# Patient Record
Sex: Female | Born: 1960 | Race: Black or African American | Hispanic: No | Marital: Single | Smoking: Never smoker
Health system: Southern US, Community
[De-identification: ages and names within clinical notes are randomized; demographics above are authoritative.]

## PROBLEM LIST (undated history)

## (undated) DIAGNOSIS — S069X9A Unspecified intracranial injury with loss of consciousness of unspecified duration, initial encounter: Secondary | ICD-10-CM

## (undated) DIAGNOSIS — I10 Essential (primary) hypertension: Secondary | ICD-10-CM

## (undated) DIAGNOSIS — E669 Obesity, unspecified: Secondary | ICD-10-CM

## (undated) DIAGNOSIS — G4730 Sleep apnea, unspecified: Secondary | ICD-10-CM

## (undated) DIAGNOSIS — J45909 Unspecified asthma, uncomplicated: Secondary | ICD-10-CM

## (undated) DIAGNOSIS — M1990 Unspecified osteoarthritis, unspecified site: Secondary | ICD-10-CM

## (undated) DIAGNOSIS — R0600 Dyspnea, unspecified: Secondary | ICD-10-CM

## (undated) DIAGNOSIS — S069XAA Unspecified intracranial injury with loss of consciousness status unknown, initial encounter: Secondary | ICD-10-CM

## (undated) HISTORY — PX: BRAIN SURGERY: SHX531

## (undated) HISTORY — DX: Unspecified asthma, uncomplicated: J45.909

---

## 1997-06-24 ENCOUNTER — Emergency Department (HOSPITAL_COMMUNITY): Admission: EM | Admit: 1997-06-24 | Discharge: 1997-06-24 | Payer: Self-pay | Admitting: Emergency Medicine

## 1997-07-13 ENCOUNTER — Encounter: Admission: RE | Admit: 1997-07-13 | Discharge: 1997-07-13 | Payer: Self-pay | Admitting: Obstetrics & Gynecology

## 1997-08-04 ENCOUNTER — Encounter: Admission: RE | Admit: 1997-08-04 | Discharge: 1997-08-04 | Payer: Self-pay | Admitting: Internal Medicine

## 1997-08-26 ENCOUNTER — Encounter: Admission: RE | Admit: 1997-08-26 | Discharge: 1997-08-26 | Payer: Self-pay | Admitting: Internal Medicine

## 1997-08-31 ENCOUNTER — Encounter: Admission: RE | Admit: 1997-08-31 | Discharge: 1997-11-29 | Payer: Self-pay | Admitting: *Deleted

## 1997-10-12 ENCOUNTER — Other Ambulatory Visit: Admission: RE | Admit: 1997-10-12 | Discharge: 1997-10-12 | Payer: Self-pay | Admitting: Obstetrics & Gynecology

## 1997-10-12 ENCOUNTER — Encounter: Admission: RE | Admit: 1997-10-12 | Discharge: 1997-10-12 | Payer: Self-pay | Admitting: Obstetrics & Gynecology

## 1998-06-16 ENCOUNTER — Encounter: Admission: RE | Admit: 1998-06-16 | Discharge: 1998-06-16 | Payer: Self-pay | Admitting: Internal Medicine

## 1998-11-11 ENCOUNTER — Other Ambulatory Visit: Admission: RE | Admit: 1998-11-11 | Discharge: 1998-11-11 | Payer: Self-pay | Admitting: Internal Medicine

## 1998-11-11 ENCOUNTER — Encounter: Admission: RE | Admit: 1998-11-11 | Discharge: 1998-11-11 | Payer: Self-pay | Admitting: Internal Medicine

## 1998-11-25 ENCOUNTER — Encounter: Admission: RE | Admit: 1998-11-25 | Discharge: 1998-11-25 | Payer: Self-pay | Admitting: Internal Medicine

## 1999-07-04 ENCOUNTER — Encounter: Admission: RE | Admit: 1999-07-04 | Discharge: 1999-07-04 | Payer: Self-pay | Admitting: Internal Medicine

## 2002-06-22 ENCOUNTER — Encounter: Payer: Self-pay | Admitting: *Deleted

## 2002-06-22 ENCOUNTER — Emergency Department (HOSPITAL_COMMUNITY): Admission: EM | Admit: 2002-06-22 | Discharge: 2002-06-22 | Payer: Self-pay | Admitting: Emergency Medicine

## 2003-05-03 ENCOUNTER — Encounter: Admission: RE | Admit: 2003-05-03 | Discharge: 2003-05-03 | Payer: Self-pay | Admitting: Internal Medicine

## 2003-05-05 ENCOUNTER — Encounter: Admission: RE | Admit: 2003-05-05 | Discharge: 2003-05-05 | Payer: Self-pay | Admitting: Internal Medicine

## 2006-06-13 ENCOUNTER — Emergency Department (HOSPITAL_COMMUNITY): Admission: EM | Admit: 2006-06-13 | Discharge: 2006-06-13 | Payer: Self-pay | Admitting: Emergency Medicine

## 2010-02-14 ENCOUNTER — Emergency Department (HOSPITAL_COMMUNITY)
Admission: EM | Admit: 2010-02-14 | Discharge: 2010-02-14 | Payer: Self-pay | Source: Home / Self Care | Admitting: Emergency Medicine

## 2011-12-19 ENCOUNTER — Emergency Department (HOSPITAL_COMMUNITY)
Admission: EM | Admit: 2011-12-19 | Discharge: 2011-12-19 | Disposition: A | Discharge status: Home / Self Care | Payer: Medicare Other | Attending: Emergency Medicine | Admitting: Emergency Medicine

## 2011-12-19 ENCOUNTER — Emergency Department (HOSPITAL_COMMUNITY): Payer: Medicare Other

## 2011-12-19 ENCOUNTER — Encounter (HOSPITAL_COMMUNITY): Payer: Self-pay

## 2011-12-19 DIAGNOSIS — S92902A Unspecified fracture of left foot, initial encounter for closed fracture: Secondary | ICD-10-CM

## 2011-12-19 DIAGNOSIS — Y929 Unspecified place or not applicable: Secondary | ICD-10-CM | POA: Insufficient documentation

## 2011-12-19 DIAGNOSIS — S92309A Fracture of unspecified metatarsal bone(s), unspecified foot, initial encounter for closed fracture: Secondary | ICD-10-CM | POA: Insufficient documentation

## 2011-12-19 DIAGNOSIS — Z87891 Personal history of nicotine dependence: Secondary | ICD-10-CM | POA: Insufficient documentation

## 2011-12-19 DIAGNOSIS — Z87820 Personal history of traumatic brain injury: Secondary | ICD-10-CM | POA: Insufficient documentation

## 2011-12-19 DIAGNOSIS — E669 Obesity, unspecified: Secondary | ICD-10-CM | POA: Insufficient documentation

## 2011-12-19 DIAGNOSIS — R269 Unspecified abnormalities of gait and mobility: Secondary | ICD-10-CM | POA: Insufficient documentation

## 2011-12-19 DIAGNOSIS — Y9389 Activity, other specified: Secondary | ICD-10-CM | POA: Insufficient documentation

## 2011-12-19 DIAGNOSIS — X500XXA Overexertion from strenuous movement or load, initial encounter: Secondary | ICD-10-CM | POA: Insufficient documentation

## 2011-12-19 DIAGNOSIS — M7989 Other specified soft tissue disorders: Secondary | ICD-10-CM | POA: Insufficient documentation

## 2011-12-19 HISTORY — DX: Obesity, unspecified: E66.9

## 2011-12-19 HISTORY — DX: Unspecified intracranial injury with loss of consciousness status unknown, initial encounter: S06.9XAA

## 2011-12-19 HISTORY — DX: Unspecified intracranial injury with loss of consciousness of unspecified duration, initial encounter: S06.9X9A

## 2011-12-19 MED ORDER — TRAMADOL HCL 50 MG PO TABS: 50.0000 mg | ORAL_TABLET | Freq: Four times a day (QID) | ORAL | Status: DC | PRN

## 2011-12-19 NOTE — ED Notes (Signed)
Patient c/o left foot pain and states that she has had 2 falls the most recent being 2 weeks ago. Patient has swelling to left foot and left ankle. Patient c/o increased pain when bearing weight.

## 2011-12-21 NOTE — ED Provider Notes (Signed)
History     CSN: [id number]  Arrival date & time 12/19/11  1241   First MD Initiated Contact with Patient 12/19/11 1245      Chief Complaint  Patient presents with  . Foot Injury    (Consider location/radiation/quality/duration/timing/severity/associated sxs/prior treatment) HPI Comments: CC left foot pain  Ankle eversion sprain while walking down steps 4 weeks ago w/ immediate onset swelling, ecchymosis, and pain. 2 weeks ago, patient hit the same foot on Lateral side against the edge of the stairs. +sweliing, warmth, TTP Worse with walking. Better rest/elevaation. OTC analgesics without sig. Relief.  Using cane for ambulation. No HX gout, inflammatory arthritis. Denies systemic jt involvement. Denies paresthesias or inability to move foot/ankle/toes.  Denies fevers, chills, myalgias, arthralgias. Denies DOE, SOB, chest tightness or pressure, radiation to left arm, jaw or back, or diaphoresis. Denies dysuria, flank pain, suprapubic pain, frequency, urgency, or hematuria. Denies headaches, light headedness, weakness, visual disturbances. Denies abdominal pain, nausea, vomiting, diarrhea or constipation.      Patient is a 51 y.o. female presenting with lower extremity pain. The history is provided by the patient and a relative. No language interpreter was used.  Foot Pain This is a chronic problem. The current episode started 1 to 4 weeks ago. The problem occurs constantly. The problem has been unchanged. Associated symptoms include joint swelling (L foot). Pertinent negatives include no abdominal pain, anorexia, arthralgias, change in bowel habit, chest pain, chills, congestion, coughing, diaphoresis, fatigue, fever, headaches, myalgias, nausea, neck pain, numbness, rash, sore throat, swollen glands, urinary symptoms, vertigo, visual change, vomiting or weakness. The symptoms are aggravated by walking. She has tried acetaminophen, ice, heat, NSAIDs and relaxation (epsom salt  soaks) for the symptoms.    Past Medical History  Diagnosis Date  . Obesity   . Brain injury     Past Surgical History  Procedure Date  . Brain surgery     No family history on file.  History  Substance Use Topics  . Smoking status: Former Games developer  . Smokeless tobacco: Never Used  . Alcohol Use: Yes     Comment: daily beer and liquor/3 beers and 5 shots of liquor    OB History    Grav Para Term Preterm Abortions TAB SAB Ect Mult Living                  Review of Systems  Constitutional: Negative.  Negative for fever, chills, diaphoresis and fatigue.  HENT: Negative.  Negative for congestion, sore throat and neck pain.   Eyes: Negative.   Respiratory: Negative.  Negative for cough.   Cardiovascular: Negative.  Negative for chest pain.  Gastrointestinal: Negative.  Negative for nausea, vomiting, abdominal pain, anorexia and change in bowel habit.  Genitourinary: Negative.   Musculoskeletal: Positive for joint swelling (L foot) and gait problem. Negative for myalgias and arthralgias.  Skin: Negative for pallor, rash and wound.  Neurological: Negative for vertigo, weakness, numbness and headaches.  Psychiatric/Behavioral: Negative.     Allergies  Review of patient's allergies indicates no known allergies.  Home Medications   Current Outpatient Rx  Name  Route  Sig  Dispense  Refill  . ACETAMINOPHEN 500 MG PO TABS   Oral   Take 1,000 mg by mouth every 6 (six) hours as needed. Pain         . IBUPROFEN 200 MG PO TABS   Oral   Take 400 mg by mouth every 6 (six) hours as needed. Pain         .  TRAMADOL HCL 50 MG PO TABS   Oral   Take 1 tablet (50 mg total) by mouth every 6 (six) hours as needed for pain.   15 tablet   0     BP 143/95  Pulse 89  Temp 98.4 F (36.9 C)  Resp 16  SpO2 96%  Physical Exam  Nursing note and vitals reviewed. Constitutional: She is oriented to person, place, and time. She appears well-developed and well-nourished. No  distress.       Morbidly obese  HENT:  Head: Normocephalic and atraumatic.  Eyes: Conjunctivae normal are normal. No scleral icterus.  Neck: Normal range of motion.  Cardiovascular: Normal rate, regular rhythm and normal heart sounds.  Exam reveals no gallop and no friction rub.   No murmur heard. Pulmonary/Chest: Effort normal and breath sounds normal. No respiratory distress.  Abdominal: Soft. Bowel sounds are normal. She exhibits no distension and no mass. There is no tenderness. There is no guarding.  Musculoskeletal:       A left foot exam was performed. Skin: bruising, erythematous and along lateral aspect ad bases of toes 2,3,4 Swelling: moderate Warmth: mildly increased warmth Tenderness: diffuse ROM: limited by pain Gait: antalgic Neurological Exam: normal Vascular Exam: normal   Neurological: She is alert and oriented to person, place, and time.  Skin: Skin is warm and dry. She is not diaphoretic.    ED Course  Procedures (including critical care time)  Labs Reviewed - No data to display Dg Ankle Complete Left  12/19/2011  *RADIOLOGY REPORT*  Clinical Data: Fall.  Pain and swelling.  LEFT ANKLE COMPLETE - 3+ VIEW  Comparison: None.  Findings: No acute fracture at the ankle joint.  There is a fracture of the base of the fifth metatarsal.  No other acute finding.  IMPRESSION: Fracture of the base of the fifth metatarsal.   Original Report Authenticated By: Paulina Fusi, M.D.    Dg Foot Complete Left  12/19/2011  *RADIOLOGY REPORT*  Clinical Data: Pain and swelling  LEFT FOOT - COMPLETE 3+ VIEW  Comparison: Ankle radiography same day  Findings: There is a fracture of the base of the fifth metatarsal. There are acute fractures of the second and third metatarsal head regions.  There is a healed or healing fracture of the proximal phalanx of the fifth toe.  IMPRESSION: Fracture of the base of the fifth metatarsal.  Fracture of the metatarsal heads of the second and third toes.   Healed or healing fracture of the proximal phalanx of the fifth toe.   Original Report Authenticated By: Paulina Fusi, M.D.      1. Foot fracture, left       MDM  Exam concerning for possible gout vs msk injury/frax. NV intact   Filed Vitals:   12/19/11 1308 12/19/11 1458  BP: 117/75 143/95  Pulse: 89   Temp: 98.4 F (36.9 C)   Resp: 16   SpO2: 96%     Patient with frax of metatarsal heads toes 2/3 which is newer. Older healing frax of prox 5th phalanx. D/C with pain control, cam walker/ crutches and close ortho f/u.         Arthor Captain, PA-C 12/21/11 0023

## 2011-12-21 NOTE — ED Provider Notes (Signed)
Medical screening examination/treatment/procedure(s) were performed by non-physician practitioner and as supervising physician I was immediately available for consultation/collaboration.  Zackary Mckeone T Charlestine Rookstool, MD 12/21/11 1644 

## 2012-06-24 ENCOUNTER — Encounter (HOSPITAL_COMMUNITY): Payer: Self-pay | Admitting: Emergency Medicine

## 2012-06-24 ENCOUNTER — Inpatient Hospital Stay (HOSPITAL_COMMUNITY)
Admission: EM | Admit: 2012-06-24 | Discharge: 2012-06-27 | DRG: 379 | Disposition: A | Discharge status: Home / Self Care | Payer: Medicare Other | Attending: Internal Medicine | Admitting: Internal Medicine

## 2012-06-24 DIAGNOSIS — R7989 Other specified abnormal findings of blood chemistry: Secondary | ICD-10-CM | POA: Diagnosis present

## 2012-06-24 DIAGNOSIS — E669 Obesity, unspecified: Secondary | ICD-10-CM | POA: Diagnosis present

## 2012-06-24 DIAGNOSIS — R748 Abnormal levels of other serum enzymes: Secondary | ICD-10-CM | POA: Diagnosis present

## 2012-06-24 DIAGNOSIS — I10 Essential (primary) hypertension: Secondary | ICD-10-CM | POA: Diagnosis present

## 2012-06-24 DIAGNOSIS — R197 Diarrhea, unspecified: Secondary | ICD-10-CM | POA: Diagnosis present

## 2012-06-24 DIAGNOSIS — E876 Hypokalemia: Secondary | ICD-10-CM | POA: Diagnosis present

## 2012-06-24 DIAGNOSIS — F1210 Cannabis abuse, uncomplicated: Secondary | ICD-10-CM | POA: Diagnosis present

## 2012-06-24 DIAGNOSIS — K648 Other hemorrhoids: Secondary | ICD-10-CM | POA: Diagnosis present

## 2012-06-24 DIAGNOSIS — K625 Hemorrhage of anus and rectum: Secondary | ICD-10-CM | POA: Diagnosis present

## 2012-06-24 DIAGNOSIS — D696 Thrombocytopenia, unspecified: Secondary | ICD-10-CM | POA: Diagnosis present

## 2012-06-24 DIAGNOSIS — K5731 Diverticulosis of large intestine without perforation or abscess with bleeding: Principal | ICD-10-CM | POA: Diagnosis present

## 2012-06-24 DIAGNOSIS — F1010 Alcohol abuse, uncomplicated: Secondary | ICD-10-CM | POA: Diagnosis present

## 2012-06-24 DIAGNOSIS — R000 Tachycardia, unspecified: Secondary | ICD-10-CM | POA: Diagnosis present

## 2012-06-24 HISTORY — DX: Essential (primary) hypertension: I10

## 2012-06-24 LAB — CBC
HCT: 41.8 % (ref 36.0–46.0)
Hemoglobin: 14.6 g/dL (ref 12.0–15.0)
MCH: 31.5 pg (ref 26.0–34.0)
MCV: 91.9 fL (ref 78.0–100.0)
MCV: 91.9 fL (ref 78.0–100.0)
Platelets: 110 10*3/uL — ABNORMAL LOW (ref 150–400)
RBC: 4.55 MIL/uL (ref 3.87–5.11)
RBC: 4.22 MIL/uL (ref 3.87–5.11)
RDW: 13.1 % (ref 11.5–15.5)
WBC: 5.5 10*3/uL (ref 4.0–10.5)

## 2012-06-24 LAB — PROTIME-INR
INR: 1.17 (ref 0.00–1.49)
Prothrombin Time: 14.7 seconds (ref 11.6–15.2)

## 2012-06-24 LAB — COMPREHENSIVE METABOLIC PANEL
Alkaline Phosphatase: 130 U/L — ABNORMAL HIGH (ref 39–117)
BUN: 10 mg/dL (ref 6–23)
CO2: 25 mEq/L (ref 19–32)
Chloride: 103 mEq/L (ref 96–112)
Creatinine, Ser: 0.55 mg/dL (ref 0.50–1.10)
GFR calc non Af Amer: >90 mL/min (ref >90)
Total Bilirubin: 1 mg/dL (ref 0.3–1.2)

## 2012-06-24 LAB — OCCULT BLOOD, POC DEVICE: Fecal Occult Bld: POSITIVE — AB

## 2012-06-24 MED ORDER — POTASSIUM CHLORIDE CRYS ER 20 MEQ PO TBCR: 30.0000 meq | EXTENDED_RELEASE_TABLET | Freq: Once | ORAL | Status: DC

## 2012-06-24 MED ORDER — SODIUM CHLORIDE 0.9 % IV BOLUS (SEPSIS)
1000.0000 mL | Freq: Once | INTRAVENOUS | Status: AC
  Administered 2012-06-24: 1000 mL via INTRAVENOUS

## 2012-06-24 MED ORDER — THIAMINE HCL 100 MG/ML IJ SOLN
100.0000 mg | Freq: Every day | INTRAMUSCULAR | Status: DC
  Filled 2012-06-24 (×3): qty 1

## 2012-06-24 MED ORDER — ADULT MULTIVITAMIN W/MINERALS CH
1.0000 | ORAL_TABLET | Freq: Every day | ORAL | Status: DC
  Administered 2012-06-25 – 2012-06-27 (×2): 1 via ORAL
  Filled 2012-06-24 (×3): qty 1

## 2012-06-24 MED ORDER — LORAZEPAM 1 MG PO TABS: 1.0000 mg | ORAL_TABLET | Freq: Four times a day (QID) | ORAL | Status: DC | PRN

## 2012-06-24 MED ORDER — ONDANSETRON HCL 4 MG/2ML IJ SOLN: 4.0000 mg | Freq: Three times a day (TID) | INTRAMUSCULAR | Status: AC | PRN

## 2012-06-24 MED ORDER — ALUM & MAG HYDROXIDE-SIMETH 200-200-20 MG/5ML PO SUSP: 30.0000 mL | Freq: Four times a day (QID) | ORAL | Status: DC | PRN

## 2012-06-24 MED ORDER — FOLIC ACID 1 MG PO TABS
1.0000 mg | ORAL_TABLET | Freq: Every day | ORAL | Status: DC
  Administered 2012-06-25 – 2012-06-27 (×2): 1 mg via ORAL
  Filled 2012-06-24 (×3): qty 1

## 2012-06-24 MED ORDER — SODIUM CHLORIDE 0.9 % IV SOLN
INTRAVENOUS | Status: DC
  Administered 2012-06-24: 75 mL/h via INTRAVENOUS

## 2012-06-24 MED ORDER — POLYETHYLENE GLYCOL 3350 17 G PO PACK
17.0000 g | PACK | Freq: Every day | ORAL | Status: DC | PRN
  Filled 2012-06-24: qty 1

## 2012-06-24 MED ORDER — ACETAMINOPHEN 650 MG RE SUPP: 650.0000 mg | Freq: Four times a day (QID) | RECTAL | Status: DC | PRN

## 2012-06-24 MED ORDER — SODIUM CHLORIDE 0.9 % IV SOLN: INTRAVENOUS | Status: DC

## 2012-06-24 MED ORDER — ACETAMINOPHEN 325 MG PO TABS
650.0000 mg | ORAL_TABLET | Freq: Four times a day (QID) | ORAL | Status: DC | PRN
  Filled 2012-06-24: qty 2

## 2012-06-24 MED ORDER — VITAMIN B-1 100 MG PO TABS
100.0000 mg | ORAL_TABLET | Freq: Every day | ORAL | Status: DC
  Administered 2012-06-25 – 2012-06-27 (×2): 100 mg via ORAL
  Filled 2012-06-24 (×3): qty 1

## 2012-06-24 MED ORDER — LORAZEPAM 2 MG/ML IJ SOLN: 1.0000 mg | Freq: Four times a day (QID) | INTRAMUSCULAR | Status: DC | PRN

## 2012-06-24 NOTE — ED Notes (Signed)
Pos hemoccult reported to Dr Elly Modena

## 2012-06-24 NOTE — H&P (Signed)
[facility] History and Physical  [name] XBJ:[id number] DOB: May 04, 1960 DOA: 06/24/2012  Referring physician: Dr. Denton Lank PCP: Lieutenant Diego, MD  Specialists: none  Chief Complaint: rectal bleed  HPI: Lauren Moore is a 52 y.o. female without known PMH because he does not follow up with a doctor regularly, presents with a chief complaint of rectal bleed since last night. She states that she had a bowel movement last night, formed, and saw bright red blood both mixed with stool, and in the water bowl. She had 2 more bowel movements since. She cannot appreciate the quantity of how much blood she lost. She denies having a problem like this in the past. She denies any dark or stools. She denies any abdominal pain, has no nausea or vomiting. She denies any chest pain or breathing difficulties. She denies any lightheadedness or dizziness. She endorses a history of heavy alcohol abuse, initially stating 2 drinks per day, however later endorsing that "it might be more than that". Last drink was this morning, and she has some liquor. In the emergency room, rectal exam by EDP showed brown stool without blood and without melena, FOBT positive. Patient was persistently tachycardic, more so when sitting up, and thus admission was requested for observation.   Patient has had episodes without drinking in the past, and denies any severe withdrawal symptoms, denies any seizures.  Review of Systems: As per history of present illness, otherwise negative.  Past Medical History  Diagnosis Date  . Obesity   . Brain injury   . Hypertension    Past Surgical History  Procedure Laterality Date  . Brain surgery     Social History:  reports that she has quit smoking. She has never used smokeless tobacco. She reports that  drinks alcohol. She reports that she uses illicit drugs (Marijuana).  No Known Allergies  History reviewed. No pertinent family history.   Prior to Admission medications   Not  on File   Physical Exam: Filed Vitals:   06/24/12 0728 06/24/12 0951 06/24/12 0952 06/24/12 0953  BP: 145/89 155/97 147/101 147/105  Pulse: 113 95 103 110  Temp: 98.1 F (36.7 C)     TempSrc: Oral     Resp: 16 18 18 18   Weight: 104.044 kg (229 lb 6 oz)     SpO2: 92% 95% 95% 95%     General:  No apparent distress  Eyes: PERRL, EOMI, no scleral icterus  ENT: moist oropharynx  Neck: supple, no JVD  Cardiovascular: regular rate without MRG; 2+ peripheral pulses  Respiratory: CTA biL, good air movement without wheezing, rhonchi or crackled  Abdomen: soft, non tender to palpation, positive bowel sounds, no guarding, no rebound  Skin: no rashes  Musculoskeletal: no peripheral edema  Psychiatric: normal mood and affect  Neurologic: CN 2-12 grossly intact, MS 5/5 in all 4  Labs on Admission:  Basic Metabolic Panel:  Recent Labs Lab 06/24/12 0757  NA 141  K 3.4*  CL 103  CO2 25  GLUCOSE 89  BUN 10  CREATININE 0.55  CALCIUM 8.9   Liver Function Tests:  Recent Labs Lab 06/24/12 0757  AST 131*  ALT 39*  ALKPHOS 130*  BILITOT 1.0  PROT 8.8*  ALBUMIN 3.8   CBC:  Recent Labs Lab 06/24/12 0757  WBC 5.5  HGB 14.6  HCT 41.8  MCV 91.9  PLT 144*   EKG: Independently reviewed.  Assessment/Plan Active Problems:   Rectal bleed   Hypokalemia   Thrombocytopenia  Alcohol abuse   Elevated liver enzymes   Rectal bleed - HH stable but persistently tachycardic.  - monitor HH twice, asked to call for nurse to evaluate next BM.   Alcohol abuse - last drink this morning, liquor. Unlikely that her tachycardia is related to ETOH withdrawal, but will withdraw.  - on CIWA  Thrombocytopenia  - probable underlying liver disease  Elevated LFTs - due to #2  DVT Prophylaxis - SCDs  Code Status: Presumed full  Family Communication: sisters in the room  Disposition Plan: Obs, likely home in am   Time spent: 39  Sneha Willig M. Elvera Lennox, MD [facility] Pager [phone]  If 7PM-7AM, please contact night-coverage [email or web address] Password [facility] 06/24/2012, 11:06 AM

## 2012-06-24 NOTE — ED Notes (Addendum)
Pt states she had rectal bleeding yesterday. Pt states she has a knot in her stomach.  Pt denies hematuria.  Pt states she has bilateral back pain for the past couple of weeks.  Pt denies any injury to back in past couple of days. Pt denies burning with urination.  Pt states she has dizziness at times.  Pt states she also has arm numbness. Able to move all extremities evenly. Denies n/v/d.

## 2012-06-24 NOTE — ED Provider Notes (Addendum)
History     CSN: [id number]  Arrival date & time 06/24/12  0721   First MD Initiated Contact with Patient 06/24/12 [phone]      Chief Complaint  Patient presents with  . Rectal Bleeding    (Consider location/radiation/quality/duration/timing/severity/associated sxs/prior treatment) Patient is a 52 y.o. female presenting with hematochezia. The history is provided by the patient and a relative.  Rectal Bleeding Associated symptoms: no abdominal pain, no fever and no vomiting   pt c/o blood per rectum w last 2-3 bms in past couple days. Pt cant quantify amt of bleeding. Bright red blood. Denies hx same or prior gi bleed. Denies prior colonoscopy. Denies hx hemorrhoids, diverticula and pud. No abd pain. No nv. No faintness or dizziness. No other abn bleeding or bruising. No hematuria. Denies recent constipation or straining, no rectal trauma. Denies fever or chills. No anticoagulant use, occasional ibuprofen use. Regular etoh use w several drinks per day.     Past Medical History  Diagnosis Date  . Obesity   . Brain injury   . Hypertension     Past Surgical History  Procedure Laterality Date  . Brain surgery      History reviewed. No pertinent family history.  History  Substance Use Topics  . Smoking status: Former Games developer  . Smokeless tobacco: Never Used  . Alcohol Use: Yes     Comment: daily beer and liquor/3 beers and 5 shots of liquor    OB History   Grav Para Term Preterm Abortions TAB SAB Ect Mult Living                  Review of Systems  Constitutional: Negative for fever.  HENT: Negative for neck pain.   Eyes: Negative for redness.  Respiratory: Negative for cough and shortness of breath.   Cardiovascular: Negative for chest pain.  Gastrointestinal: Positive for hematochezia. Negative for vomiting and abdominal pain.  Genitourinary: Negative for flank pain.  Musculoskeletal: Negative for back pain.  Skin: Negative for rash.  Neurological: Negative for  headaches.  Hematological: Does not bruise/bleed easily.  Psychiatric/Behavioral: Negative for confusion.    Allergies  Review of patient's allergies indicates no known allergies.  Home Medications   Current Outpatient Rx  Name  Route  Sig  Dispense  Refill  . acetaminophen (TYLENOL) 500 MG tablet   Oral   Take 1,000 mg by mouth every 6 (six) hours as needed. Pain         . ibuprofen (ADVIL,MOTRIN) 200 MG tablet   Oral   Take 400 mg by mouth every 6 (six) hours as needed. Pain         . traMADol (ULTRAM) 50 MG tablet   Oral   Take 1 tablet (50 mg total) by mouth every 6 (six) hours as needed for pain.   15 tablet   0     BP 145/89  Pulse 113  Temp(Src) 98.1 F (36.7 C) (Oral)  Resp 16  Wt 229 lb 6 oz (104.044 kg)  SpO2 92%  Physical Exam  Nursing note and vitals reviewed. Constitutional: She appears well-developed and well-nourished. No distress.  HENT:  Mouth/Throat: Oropharynx is clear and moist.  Eyes: Conjunctivae are normal. No scleral icterus.  Neck: Neck supple. No tracheal deviation present.  Cardiovascular: Normal rate, regular rhythm and intact distal pulses.   Pulmonary/Chest: Effort normal and breath sounds normal. No respiratory distress.  Abdominal: Soft. Normal appearance and bowel sounds are normal. She exhibits no distension and  no mass. There is no tenderness. There is no rebound and no guarding.  Musculoskeletal: She exhibits no edema and no tenderness.  Neurological: She is alert.  Skin: Skin is warm and dry. No rash noted.  Psychiatric: She has a normal mood and affect.    ED Course  Procedures (including critical care time)   Results for orders placed during the hospital encounter of 06/24/12  CBC      Result Value Range   WBC 5.5  4.0 - 10.5 K/uL   RBC 4.55  3.87 - 5.11 MIL/uL   Hemoglobin 14.6  12.0 - 15.0 g/dL   HCT 16.1  09.6 - 04.5 %   MCV 91.9  78.0 - 100.0 fL   MCH 32.1  26.0 - 34.0 pg   MCHC 34.9  30.0 - 36.0 g/dL    RDW 40.9  81.1 - 91.4 %   Platelets 144 (*) 150 - 400 K/uL  COMPREHENSIVE METABOLIC PANEL      Result Value Range   Sodium 141  135 - 145 mEq/L   Potassium 3.4 (*) 3.5 - 5.1 mEq/L   Chloride 103  96 - 112 mEq/L   CO2 25  19 - 32 mEq/L   Glucose, Bld 89  70 - 99 mg/dL   BUN 10  6 - 23 mg/dL   Creatinine, Ser 7.82  0.50 - 1.10 mg/dL   Calcium 8.9  8.4 - 95.6 mg/dL   Total Protein 8.8 (*) 6.0 - 8.3 g/dL   Albumin 3.8  3.5 - 5.2 g/dL   AST 213 (*) 0 - 37 U/L   ALT 39 (*) 0 - 35 U/L   Alkaline Phosphatase 130 (*) 39 - 117 U/L   Total Bilirubin 1.0  0.3 - 1.2 mg/dL   GFR calc non Af Amer >90  >90 mL/min   GFR calc Af Amer >90  >90 mL/min  PROTIME-INR      Result Value Range   Prothrombin Time 14.7  11.6 - 15.2 seconds   INR 1.17  0.00 - 1.49  OCCULT BLOOD, POC DEVICE      Result Value Range   Fecal Occult Bld POSITIVE (*) NEGATIVE  TYPE AND SCREEN      Result Value Range   ABO/RH(D) B POS     Antibody Screen NEG     Sample Expiration 06/27/2012    ABO/RH      Result Value Range   ABO/RH(D) PENDING         MDM  Labs.   Reviewed nursing notes and prior charts for additional history.   Pt reports a couple bloody bms, no recurrent bm in ed. Mildly tachycardic on arrival, mildly orthostatic.  Iv ns bolus. Med service called to admit/obs.   Med service says tele, team 2, obs.  Pt states last drank last pm.  No tremor or shakes, no sweats.          Suzi Roots, MD 06/24/12 1005  Suzi Roots, MD 06/24/12 1019
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
## 2012-06-25 DIAGNOSIS — R748 Abnormal levels of other serum enzymes: Secondary | ICD-10-CM

## 2012-06-25 LAB — CBC
HCT: 37.9 % (ref 36.0–46.0)
MCHC: 32.7 g/dL (ref 30.0–36.0)
Platelets: 87 10*3/uL — ABNORMAL LOW (ref 150–400)
RDW: 13.2 % (ref 11.5–15.5)
WBC: 4 10*3/uL (ref 4.0–10.5)

## 2012-06-25 MED ORDER — PEG 3350-KCL-NA BICARB-NACL 420 G PO SOLR
4000.0000 mL | Freq: Once | ORAL | Status: AC
  Administered 2012-06-25: 4000 mL via ORAL

## 2012-06-25 MED ORDER — SODIUM CHLORIDE 0.9 % IV SOLN: INTRAVENOUS | Status: DC

## 2012-06-25 MED ORDER — HYDRALAZINE HCL 20 MG/ML IJ SOLN
10.0000 mg | Freq: Three times a day (TID) | INTRAMUSCULAR | Status: DC | PRN
  Administered 2012-06-25: 10 mg via INTRAVENOUS
  Filled 2012-06-25: qty 1

## 2012-06-25 NOTE — Progress Notes (Signed)
Clinical Social Work  CSW received referral to assess for substance abuse and to provide resources if desired. CSW attempted to meet with patient but several visitors in room at this time. CSW will follow up a later time to maintain confidentiality for patient.  [employer], [place] [phone]

## 2012-06-25 NOTE — Progress Notes (Signed)
Pt's BP 168/118 manually. Pt given IV hydralazine 10mg . Pt's bp rechecked manually and pt's BP 158/98. MD notified.
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
## 2012-06-25 NOTE — Consult Note (Signed)
[facility] Consult Note  Referring Provider: No ref. provider found Primary Care Physician:  Lieutenant Diego, MD Primary Gastroenterologist:  Dr.  Antony Contras Complaint: rectal bleeding HPI: Lauren Moore is an 52 y.o. black  female  Who resented with painless hematochezia x 3 with normal hb, Bun and Creatinine. Ids 52 years old and has never had a colonoscopy  Past Medical History  Diagnosis Date  . Obesity   . Brain injury   . Hypertension     Past Surgical History  Procedure Laterality Date  . Brain surgery      No prescriptions prior to admission    Allergies: No Known Allergies  History reviewed. No pertinent family history.  Social History:  reports that she has quit smoking. She has never used smokeless tobacco. She reports that  drinks alcohol. She reports that she uses illicit drugs (Marijuana).  Review of Systems: negative except as above   Blood pressure 153/97, pulse 84, temperature 98.4 F (36.9 C), temperature source Oral, resp. rate 20, height 5\' 5"  (1.651 m), weight 104.044 kg (229 lb 6 oz), SpO2 96.00%. Head: Normocephalic, without obvious abnormality, atraumatic Neck: no adenopathy, no carotid bruit, no JVD, supple, symmetrical, trachea midline and thyroid not enlarged, symmetric, no tenderness/mass/nodules Resp: clear to auscultation bilaterally Cardio: regular rate and rhythm, S1, S2 normal, no murmur, click, rub or gallop GI: abdomen soft, nontender Extremities: extremities normal, atraumatic, no cyanosis or edema  Results for orders placed during the hospital encounter of 06/24/12 (from the past 48 hour(s))  ABO/RH     Status: None   Collection Time    06/24/12  7:42 AM      Result Value Range   ABO/RH(D) B POS    CBC     Status: Abnormal   Collection Time    06/24/12  7:57 AM      Result Value Range   WBC 5.5  4.0 - 10.5 K/uL   RBC 4.55  3.87 - 5.11 MIL/uL   Hemoglobin 14.6  12.0 - 15.0 g/dL   HCT 16.1  09.6 - 04.5 %   MCV 91.9   78.0 - 100.0 fL   MCH 32.1  26.0 - 34.0 pg   MCHC 34.9  30.0 - 36.0 g/dL   RDW 40.9  81.1 - 91.4 %   Platelets 144 (*) 150 - 400 K/uL  COMPREHENSIVE METABOLIC PANEL     Status: Abnormal   Collection Time    06/24/12  7:57 AM      Result Value Range   Sodium 141  135 - 145 mEq/L   Potassium 3.4 (*) 3.5 - 5.1 mEq/L   Chloride 103  96 - 112 mEq/L   CO2 25  19 - 32 mEq/L   Glucose, Bld 89  70 - 99 mg/dL   BUN 10  6 - 23 mg/dL   Creatinine, Ser 7.82  0.50 - 1.10 mg/dL   Calcium 8.9  8.4 - 95.6 mg/dL   Total Protein 8.8 (*) 6.0 - 8.3 g/dL   Albumin 3.8  3.5 - 5.2 g/dL   AST 213 (*) 0 - 37 U/L   ALT 39 (*) 0 - 35 U/L   Alkaline Phosphatase 130 (*) 39 - 117 U/L   Total Bilirubin 1.0  0.3 - 1.2 mg/dL   GFR calc non Af Amer >90  >90 mL/min   GFR calc Af Amer >90  >90 mL/min   Comment:            The  eGFR has been calculated     using the CKD EPI equation.     This calculation has not been     validated in all clinical     situations.     eGFR's persistently     <90 mL/min signify     possible Chronic Kidney Disease.  PROTIME-INR     Status: None   Collection Time    06/24/12  7:57 AM      Result Value Range   Prothrombin Time 14.7  11.6 - 15.2 seconds   INR 1.17  0.00 - 1.49  TYPE AND SCREEN     Status: None   Collection Time    06/24/12  7:57 AM      Result Value Range   ABO/RH(D) B POS     Antibody Screen NEG     Sample Expiration 06/27/2012    MAGNESIUM     Status: None   Collection Time    06/24/12  7:57 AM      Result Value Range   Magnesium 1.9  1.5 - 2.5 mg/dL  OCCULT BLOOD, POC DEVICE     Status: Abnormal   Collection Time    06/24/12  8:09 AM      Result Value Range   Fecal Occult Bld POSITIVE (*) NEGATIVE  CBC     Status: Abnormal   Collection Time    06/24/12  5:45 PM      Result Value Range   WBC 7.3  4.0 - 10.5 K/uL   RBC 4.22  3.87 - 5.11 MIL/uL   Hemoglobin 13.3  12.0 - 15.0 g/dL   HCT 11.9  14.7 - 82.9 %   MCV 91.9  78.0 - 100.0 fL   MCH 31.5   26.0 - 34.0 pg   MCHC 34.3  30.0 - 36.0 g/dL   RDW 56.2  13.0 - 86.5 %   Platelets 110 (*) 150 - 400 K/uL   Comment: SPECIMEN CHECKED FOR CLOTS     REPEATED TO VERIFY     LARGE PLATELETS PRESENT  CBC     Status: Abnormal   Collection Time    06/25/12  5:24 AM      Result Value Range   WBC 4.0  4.0 - 10.5 K/uL   RBC 4.09  3.87 - 5.11 MIL/uL   Hemoglobin 12.4  12.0 - 15.0 g/dL   HCT 78.4  69.6 - 29.5 %   MCV 92.7  78.0 - 100.0 fL   MCH 30.3  26.0 - 34.0 pg   MCHC 32.7  30.0 - 36.0 g/dL   RDW 28.4  13.2 - 44.0 %   Platelets 87 (*) 150 - 400 K/uL   Comment: SPECIMEN CHECKED FOR CLOTS     REPEATED TO VERIFY     PLATELET COUNT CONFIRMED BY SMEAR   No results found.  Assessment: Rectal bleeding Plan:  Colonoscopy tomorrow  Cheyene Hamric C 06/25/2012, 12:43 PM

## 2012-06-25 NOTE — Progress Notes (Signed)
[facility] PROGRESS NOTE  Lauren Moore HYQ:[id number] DOB: Sep 22, 1960 DOA: 06/24/2012 PCP: Lieutenant Diego, MD  Assessment/Plan: 1. GI bleed, hematochezia: no blood in the stool today. Hb on admission at 14, today at 12. Continue to cycle hb. Occult blood positive. Will consult GI for colonoscopy consideration.  2. Alcohol use: Continue with CIWA protocol. Thiamine and folate.  3. Diarrhea: check for c diff.  4. Hypertension: PRN Hydralazine. Will avoid start oral BP medications in setting of GI bleed.  5. Transaminases: in setting of alcohol use.  6. Thrombocytopenia: probably related to alcohol use.   Code Status: Full Family Communication: Care discussed with patient.  Disposition Plan: home when stable.    Consultants:  [facility].   Procedures: none Antibiotics:  none  HPI/Subjective: Patient relates blood in the stool day of admission, tea spoon three times. She relates watery stool, 3 episode yesterday 2 today. No prior history of antibiotics use. She denies abdominal pain. No weight loss, no history of colon cancer in her family.   Objective: Filed Vitals:   06/24/12 1500 06/24/12 1825 06/24/12 2139 06/25/12 0600  BP:  157/96 146/91 153/97  Pulse:  114 101 84  Temp:  98.5 F (36.9 C) 98.5 F (36.9 C) 98.4 F (36.9 C)  TempSrc:  Oral Oral Oral  Resp:  20 18 20   Height: 5\' 5"  (1.651 m)     Weight:      SpO2:  97% 96% 96%    Intake/Output Summary (Last 24 hours) at 06/25/12 1231 Last data filed at 06/25/12 0500  Gross per 24 hour  Intake 1013.75 ml  Output      0 ml  Net 1013.75 ml   Filed Weights   06/24/12 0728  Weight: 104.044 kg (229 lb 6 oz)    Exam:   General:  No distress.  Cardiovascular: S 1, S 2 RRR  Respiratory: CTA  Abdomen: BS present, soft, Nt  Musculoskeletal: no edema.   Data Reviewed: Basic Metabolic Panel:  Recent Labs Lab 06/24/12 0757  NA 141  K 3.4*  CL 103  CO2 25  GLUCOSE 89  BUN 10  CREATININE  0.55  CALCIUM 8.9  MG 1.9   Liver Function Tests:  Recent Labs Lab 06/24/12 0757  AST 131*  ALT 39*  ALKPHOS 130*  BILITOT 1.0  PROT 8.8*  ALBUMIN 3.8   No results found for this basename: LIPASE, AMYLASE,  in the last 168 hours No results found for this basename: AMMONIA,  in the last 168 hours CBC:  Recent Labs Lab 06/24/12 0757 06/24/12 1745 06/25/12 0524  WBC 5.5 7.3 4.0  HGB 14.6 13.3 12.4  HCT 41.8 38.8 37.9  MCV 91.9 91.9 92.7  PLT 144* 110* 87*   Cardiac Enzymes: No results found for this basename: CKTOTAL, CKMB, CKMBINDEX, TROPONINI,  in the last 168 hours BNP (last 3 results) No results found for this basename: PROBNP,  in the last 8760 hours CBG: No results found for this basename: GLUCAP,  in the last 168 hours  No results found for this or any previous visit (from the past 240 hour(s)).   Studies: No results found.  Scheduled Meds: . folic acid  1 mg Oral Daily  . multivitamin with minerals  1 tablet Oral Daily  . potassium chloride  30 mEq Oral Once  . thiamine  100 mg Oral Daily   Or  . thiamine  100 mg Intravenous Daily   Continuous Infusions: . sodium chloride 75 mL/hr (  06/24/12 1529)    Active Problems:   Rectal bleed   Hypokalemia   Thrombocytopenia   Alcohol abuse   Elevated liver enzymes    Time spent: 35 minutes.     Ruben Pyka  [facility] Pager [phone]. If 7PM-7AM, please contact night-coverage at [email or web address], password [facility] 06/25/2012, 12:31 PM  LOS: 1 day

## 2012-06-25 NOTE — Progress Notes (Signed)
Pt finished dose of GoLytely at 2245 pm. Pt is not having any nausea or vomiting.

## 2012-06-26 ENCOUNTER — Encounter (HOSPITAL_COMMUNITY)
Admission: EM | Disposition: A | Discharge status: Home / Self Care | Payer: Self-pay | Source: Home / Self Care | Attending: Internal Medicine

## 2012-06-26 ENCOUNTER — Encounter (HOSPITAL_COMMUNITY): Payer: Self-pay | Admitting: *Deleted

## 2012-06-26 HISTORY — PX: COLONOSCOPY: SHX5424

## 2012-06-26 LAB — BASIC METABOLIC PANEL
BUN: 4 mg/dL — ABNORMAL LOW (ref 6–23)
CO2: 26 mEq/L (ref 19–32)
Chloride: 103 mEq/L (ref 96–112)
GFR calc Af Amer: >90 mL/min (ref >90)
GFR calc Af Amer: >90 mL/min (ref >90)
GFR calc non Af Amer: >90 mL/min (ref >90)
Potassium: 2.8 mEq/L — ABNORMAL LOW (ref 3.5–5.1)
Potassium: 2.8 mEq/L — ABNORMAL LOW (ref 3.5–5.1)
Sodium: 139 mEq/L (ref 135–145)
Sodium: 138 mEq/L (ref 135–145)

## 2012-06-26 LAB — CBC
MCV: 91.9 fL (ref 78.0–100.0)
Platelets: 90 10*3/uL — ABNORMAL LOW (ref 150–400)
RBC: 4.31 MIL/uL (ref 3.87–5.11)
RDW: 13.2 % (ref 11.5–15.5)
WBC: 5.7 10*3/uL (ref 4.0–10.5)

## 2012-06-26 LAB — CLOSTRIDIUM DIFFICILE BY PCR: Toxigenic C. Difficile by PCR: NEGATIVE

## 2012-06-26 SURGERY — COLONOSCOPY
Anesthesia: Moderate Sedation

## 2012-06-26 MED ORDER — DIPHENHYDRAMINE HCL 50 MG/ML IJ SOLN
INTRAMUSCULAR | Status: AC
  Filled 2012-06-26: qty 1

## 2012-06-26 MED ORDER — POTASSIUM CHLORIDE CRYS ER 20 MEQ PO TBCR
40.0000 meq | EXTENDED_RELEASE_TABLET | Freq: Once | ORAL | Status: AC
  Administered 2012-06-26: 40 meq via ORAL
  Filled 2012-06-26: qty 2

## 2012-06-26 MED ORDER — POTASSIUM CHLORIDE IN NACL 20-0.9 MEQ/L-% IV SOLN
INTRAVENOUS | Status: DC
  Filled 2012-06-26 (×2): qty 1000

## 2012-06-26 MED ORDER — POTASSIUM CHLORIDE 10 MEQ/100ML IV SOLN
10.0000 meq | INTRAVENOUS | Status: AC
  Administered 2012-06-26 – 2012-06-27 (×4): 10 meq via INTRAVENOUS
  Filled 2012-06-26 (×4): qty 100

## 2012-06-26 MED ORDER — POTASSIUM CHLORIDE 10 MEQ/100ML IV SOLN
10.0000 meq | INTRAVENOUS | Status: AC
  Administered 2012-06-26 (×2): 10 meq via INTRAVENOUS
  Filled 2012-06-26 (×4): qty 100

## 2012-06-26 MED ORDER — MIDAZOLAM HCL 10 MG/2ML IJ SOLN
INTRAMUSCULAR | Status: AC
  Filled 2012-06-26: qty 2

## 2012-06-26 MED ORDER — FENTANYL CITRATE 0.05 MG/ML IJ SOLN
INTRAMUSCULAR | Status: AC
  Filled 2012-06-26: qty 2

## 2012-06-26 MED ORDER — FENTANYL CITRATE 0.05 MG/ML IJ SOLN
INTRAMUSCULAR | Status: DC | PRN
  Administered 2012-06-26 (×3): 25 ug via INTRAVENOUS

## 2012-06-26 MED ORDER — MIDAZOLAM HCL 5 MG/5ML IJ SOLN
INTRAMUSCULAR | Status: DC | PRN
  Administered 2012-06-26 (×3): 2 mg via INTRAVENOUS

## 2012-06-26 NOTE — Progress Notes (Signed)
[facility] PROGRESS NOTE  Lauren CUPPLES ZOX:[id number] DOB: Mar 09, 1960 DOA: 06/24/2012 PCP: Lieutenant Diego, MD  Assessment/Plan: 1. GI bleed, hematochezia: no blood in the stool today. Hb on admission at 14, today at 12. Continue to cycle hb. Occult blood positive. For colonoscopy today. Depending on colonoscopy result will plan for discharge today.  2. Alcohol use: Continue with CIWA protocol. Thiamine and folate. Patient has not required Ativan. No evidence of DT.  3. Diarrhea:  c diff negative.  4. Hypertension: PRN Hydralazine. Will consider start low dose Norvasc.  5. Transaminases: in setting of alcohol use.  6. Thrombocytopenia: probably related to alcohol use.  7. Hypokalemia: replete with 4 runs IV. Repeat B-met this afternoon.   Code Status: Full Family Communication: Care discussed with patient.  Disposition Plan: home when stable.    Consultants:  [facility].   Procedures: none Antibiotics:  none  HPI/Subjective: No more blood in the stool. She is feeling well.   Objective: Filed Vitals:   06/25/12 2112 06/25/12 2147 06/25/12 2300 06/26/12 0502  BP: 172/133 168/118 158/98 134/85  Pulse: 94   95  Temp: 99.8 F (37.7 C)   98.8 F (37.1 C)  TempSrc: Oral   Oral  Resp: 18   18  Height:      Weight:      SpO2: 95%   94%    Intake/Output Summary (Last 24 hours) at 06/26/12 1302 Last data filed at 06/26/12 0600  Gross per 24 hour  Intake    430 ml  Output    500 ml  Net    -70 ml   Filed Weights   06/24/12 0728  Weight: 104.044 kg (229 lb 6 oz)    Exam:   General:  No distress.  Cardiovascular: S 1, S 2 RRR  Respiratory: CTA  Abdomen: BS present, soft, Nt  Musculoskeletal: no edema.   Data Reviewed: Basic Metabolic Panel:  Recent Labs Lab 06/24/12 0757 06/26/12 0458  NA 141 139  K 3.4* 2.8*  CL 103 103  CO2 25 26  GLUCOSE 89 97  BUN 10 3*  CREATININE 0.55 0.53  CALCIUM 8.9 8.9  MG 1.9  --    Liver Function  Tests:  Recent Labs Lab 06/24/12 0757  AST 131*  ALT 39*  ALKPHOS 130*  BILITOT 1.0  PROT 8.8*  ALBUMIN 3.8  CBC:  Recent Labs Lab 06/24/12 0757 06/24/12 1745 06/25/12 0524 06/26/12 0458  WBC 5.5 7.3 4.0 5.7  HGB 14.6 13.3 12.4 13.4  HCT 41.8 38.8 37.9 39.6  MCV 91.9 91.9 92.7 91.9  PLT 144* 110* 87* 90*   Cardiac Enzymes: No results found for this basename: CKTOTAL, CKMB, CKMBINDEX, TROPONINI,  in the last 168 hours BNP (last 3 results) No results found for this basename: PROBNP,  in the last 8760 hours CBG: No results found for this basename: GLUCAP,  in the last 168 hours  Recent Results (from the past 240 hour(s))  CLOSTRIDIUM DIFFICILE BY PCR     Status: None   Collection Time    06/25/12  6:20 PM      Result Value Range Status   C difficile by pcr NEGATIVE  NEGATIVE Final     Studies: No results found.  Scheduled Meds: . folic acid  1 mg Oral Daily  . multivitamin with minerals  1 tablet Oral Daily  . potassium chloride  10 mEq Intravenous Q1 Hr x 4  . potassium chloride  30 mEq Oral Once  .  thiamine  100 mg Oral Daily   Or  . thiamine  100 mg Intravenous Daily   Continuous Infusions: . sodium chloride 10 mL/hr at 06/25/12 1700  . 0.9 % NaCl with KCl 20 mEq / L      Active Problems:   Rectal bleed   Hypokalemia   Thrombocytopenia   Alcohol abuse   Elevated liver enzymes    Time spent: 25 minutes.     Dyllon Henken  [facility] Pager [phone]. If 7PM-7AM, please contact night-coverage at [email or web address], password [facility] 06/26/2012, 1:02 PM  LOS: 2 days

## 2012-06-26 NOTE — Op Note (Signed)
[facility] [address] [place], [address]   COLONOSCOPY PROCEDURE REPORT  PATIENT: Lauren Moore, Lauren Moore  MR#: [id number] BIRTHDATE: [birth year] , 51  yrs. old GENDER: Female ENDOSCOPIST: Dorena Cookey, MD REFERRED BY: PROCEDURE DATE:  06/26/2012 PROCEDURE: ASA CLASS: INDICATIONS:  rectal bleeding MEDICATIONS:    75 fentanyl 6 versed  DESCRIPTION OF PROCEDURE: Complete to cecum. Rare sigmoid diverticuli, small non-bleeding internal hemorrhoids, otherwise nml     COMPLICATIONS: None  ENDOSCOPIC IMPRESSION:  RECOMMENDATIONS:repeat colonoscopy in 10 years    _______________________________ eSignedDorena Cookey, MD 06/26/2012 3:22 PM

## 2012-06-27 LAB — BASIC METABOLIC PANEL
GFR calc Af Amer: >90 mL/min (ref >90)
GFR calc non Af Amer: >90 mL/min (ref >90)
Glucose, Bld: 89 mg/dL (ref 70–99)
Potassium: 3.8 mEq/L (ref 3.5–5.1)
Sodium: 140 mEq/L (ref 135–145)

## 2012-06-27 LAB — CBC
Hemoglobin: 12.7 g/dL (ref 12.0–15.0)
MCHC: 33.2 g/dL (ref 30.0–36.0)
Platelets: 88 10*3/uL — ABNORMAL LOW (ref 150–400)

## 2012-06-27 MED ORDER — AMLODIPINE BESYLATE 2.5 MG PO TABS: 2.5000 mg | ORAL_TABLET | Freq: Every day | ORAL | Status: DC

## 2012-06-27 MED ORDER — ADULT MULTIVITAMIN W/MINERALS CH: 1.0000 | ORAL_TABLET | Freq: Every day | ORAL | Status: DC

## 2012-06-27 NOTE — Progress Notes (Signed)
Patient discharged home in stable condition.  No change from morning assessment.

## 2012-06-27 NOTE — Progress Notes (Signed)
Discharge instructions and scripts given to pt with verbal feedback and understanding.  Awaiting ride.

## 2012-06-27 NOTE — Discharge Summary (Signed)
Physician Discharge Summary  Lauren Moore UEA:[id number] DOB: 1960-11-26 DOA: 06/24/2012  PCP: Lieutenant Diego, MD  Admit date: 06/24/2012 Discharge date: 06/27/2012  Time spent: 35 minutes  Recommendations for Outpatient Follow-up:  1. Need repeat LFT after alcohol cessation. 2. Need CBC to follow platelet level. If persistent thrombocytopenia after alcohol cessation consider further evaluation for thrombocytopenia.  3. Need BP evaluation.   Discharge Diagnoses:    Rectal bleed probably diverticular.    Hypokalemia   Thrombocytopenia   Alcohol abuse   Elevated liver enzymes   Discharge Condition: Stable  Diet recommendation: Heart Healthy  Filed Weights   06/24/12 0728  Weight: 104.044 kg (229 lb 6 oz)    History of present illness:  Lauren Moore is a 52 y.o. female without known PMH because he does not follow up with a doctor regularly, presents with a chief complaint of rectal bleed since last night. She states that she had a bowel movement last night, formed, and saw bright red blood both mixed with stool, and in the water bowl. She had 2 more bowel movements since. She cannot appreciate the quantity of how much blood she lost. She denies having a problem like this in the past. She denies any dark or stools. She denies any abdominal pain, has no nausea or vomiting. She denies any chest pain or breathing difficulties. She denies any lightheadedness or dizziness. She endorses a history of heavy alcohol abuse, initially stating 2 drinks per day, however later endorsing that "it might be more than that". Last drink was this morning, and she has some liquor. In the emergency room, rectal exam by EDP showed brown stool without blood and without melena, FOBT positive. Patient was persistently tachycardic, more so when sitting up, and thus admission was requested for observation.    Hospital Course:  1. GI bleed, hematochezia: Hb on admission at 14, decrease to 12. No further GI  bleed. HB has remain stable. Occult blood positive. Colonoscopy show rare sigmoid diverticula, small non bleeding internal hemorrhoids.  2. Alcohol use: Continue with CIWA protocol. Thiamine and folate. Patient has not required Ativan. No evidence of DT.  3. Diarrhea: c diff negative. Resolved. 4. Hypertension: Will  start low dose Norvasc. Needs to follow up with PCP for further adjustment of medications. 5. Transaminases: in setting of alcohol use.  Trending down.  6. Thrombocytopenia: probably related to alcohol use. Need further work up outpatient if persist.  7. Hypokalemia: resolved.  8.   Procedures: Colonoscopy 06-26-2012: Complete to cecum. Rare sigmoid  diverticuli, small non-bleeding internal hemorrhoids, otherwise nml    Consultations:  Dr Madilyn Fireman.   Discharge Exam: Filed Vitals:   06/26/12 1530 06/26/12 1540 06/26/12 2200 06/27/12 0600  BP: 114/68 132/71 134/91 133/89  Pulse:   103 87  Temp:   98.6 F (37 C) 98.7 F (37.1 C)  TempSrc:   Oral Oral  Resp: 23 18 18 18   Height:      Weight:      SpO2: 96% 96% 96% 97%    General: No distress.  Cardiovascular: S1 , S 2 RRR Respiratory: CTA  Discharge Instructions  Discharge Orders   Future Orders Complete By Expires     Diet - low sodium heart healthy  As directed     Increase activity slowly  As directed         Medication List    TAKE these medications       amLODipine 2.5 MG tablet  Commonly known as:  NORVASC  Take 1 tablet (2.5 mg total) by mouth daily.     multivitamin with minerals Tabs  Take 1 tablet by mouth daily.       No Known Allergies     Follow-up Information   Follow up with [facility], MD In 1 week.   Contact information:   [address] [place] [address]        The results of significant diagnostics from this hospitalization (including imaging, microbiology, ancillary and laboratory) are listed below for reference.    Significant Diagnostic  Studies: No results found.  Microbiology: Recent Results (from the past 240 hour(s))  CLOSTRIDIUM DIFFICILE BY PCR     Status: None   Collection Time    06/25/12  6:20 PM      Result Value Range Status   C difficile by pcr NEGATIVE  NEGATIVE Final     Labs: Basic Metabolic Panel:  Recent Labs Lab 06/24/12 0757 06/26/12 0458 06/26/12 1639 06/27/12 0507  NA 141 139 138 140  K 3.4* 2.8* 2.8* 3.8  CL 103 103 99 105  CO2 25 26 28 27   GLUCOSE 89 97 103* 89  BUN 10 3* 4* 5*  CREATININE 0.55 0.53 0.59 0.60  CALCIUM 8.9 8.9 9.3 8.8  MG 1.9  --   --   --    Liver Function Tests:  Recent Labs Lab 06/24/12 0757  AST 131*  ALT 39*  ALKPHOS 130*  BILITOT 1.0  PROT 8.8*  ALBUMIN 3.8   CBC:  Recent Labs Lab 06/24/12 0757 06/24/12 1745 06/25/12 0524 06/26/12 0458 06/27/12 0507  WBC 5.5 7.3 4.0 5.7 6.3  HGB 14.6 13.3 12.4 13.4 12.7  HCT 41.8 38.8 37.9 39.6 38.3  MCV 91.9 91.9 92.7 91.9 93.0  PLT 144* 110* 87* 90* 88*   Cardiac Enzymes: No results found for this basename: CKTOTAL, CKMB, CKMBINDEX, TROPONINI,  in the last 168 hours BNP: BNP (last 3 results) No results found for this basename: PROBNP,  in the last 8760 hours CBG: No results found for this basename: GLUCAP,  in the last 168 hours     Signed:  Shirlyn Savin  [facility] 06/27/2012, 8:11 AM

## 2012-06-30 ENCOUNTER — Encounter (HOSPITAL_COMMUNITY): Payer: Self-pay | Admitting: Gastroenterology

## 2013-06-16 ENCOUNTER — Ambulatory Visit: Payer: Medicare Other

## 2013-06-16 ENCOUNTER — Ambulatory Visit (INDEPENDENT_AMBULATORY_CARE_PROVIDER_SITE_OTHER): Payer: Medicare Other | Admitting: Internal Medicine

## 2013-06-16 ENCOUNTER — Telehealth: Payer: Self-pay | Admitting: *Deleted

## 2013-06-16 VITALS — BP 128/72 | HR 90 | Resp 18 | Ht 65.0 in | Wt 256.0 lb

## 2013-06-16 DIAGNOSIS — M25559 Pain in unspecified hip: Secondary | ICD-10-CM

## 2013-06-16 DIAGNOSIS — M545 Low back pain, unspecified: Secondary | ICD-10-CM | POA: Diagnosis not present

## 2013-06-16 MED ORDER — METHYLPREDNISOLONE ACETATE 80 MG/ML IJ SUSP
80.0000 mg | Freq: Once | INTRAMUSCULAR | Status: AC
  Administered 2013-06-16: 80 mg via INTRAMUSCULAR

## 2013-06-16 MED ORDER — HYDROCODONE-ACETAMINOPHEN 5-325 MG PO TABS: 1.0000 | ORAL_TABLET | Freq: Four times a day (QID) | ORAL | Status: DC | PRN

## 2013-06-16 MED ORDER — METHOCARBAMOL 750 MG PO TABS: 750.0000 mg | ORAL_TABLET | Freq: Four times a day (QID) | ORAL | Status: DC

## 2013-06-16 NOTE — Telephone Encounter (Signed)
Results in and pt has a mild L1 superior endplate compression fracture.  Recommend lumbar MRI or whole body bone scan. Please advise.

## 2013-06-16 NOTE — Progress Notes (Signed)
   Subjective:    Patient ID: Lauren CulverLoretta E Schumm, female    DOB: 14-Aug-1960, 53 y.o.   MRN: [id number]  HPI    Review of Systems     Objective:   Physical Exam        Assessment & Plan:

## 2013-06-16 NOTE — Progress Notes (Signed)
   Subjective:    Patient ID: Lauren Moore, female    DOB: 06-17-1960, 53 y.o.   MRN: [id number]  HPI CO left hip pain, pain starts left low back moves lateral left hip and thigh. No weakness or numbness. Has had incontinence for long time. Has been limping due to pain for over 2 months. Has no MD. No injury or fall.    Review of Systems     Objective:   Physical Exam  Constitutional: She is oriented to person, place, and time. She appears well-nourished. No distress.  HENT:  Head: Normocephalic.  Eyes: EOM are normal.  Neck: Normal range of motion.  Cardiovascular: Normal rate.   Pulmonary/Chest: Effort normal.  Musculoskeletal:       Left hip: She exhibits normal range of motion, normal strength, no tenderness and no bony tenderness.       Lumbar back: She exhibits decreased range of motion, tenderness, pain and spasm. She exhibits no bony tenderness, no swelling, no edema, no deformity and normal pulse.       Arms:      Legs: Neurological: She is alert and oriented to person, place, and time. She has normal strength. No sensory deficit. She exhibits normal muscle tone. Gait abnormal. Coordination normal.  Reflex Scores:      Patellar reflexes are 2+ on the right side and 2+ on the left side.      Achilles reflexes are 1+ on the right side and 1+ on the left side. Past hx brain surgery  Psychiatric: She has a normal mood and affect.   [facility] reading (PRIMARY) by  Dr.Bence Trapp ls spine normal, hip normal         Assessment & Plan:  Back care/RICE Robaxin 750mg /Vicodin 1/2 potid prn pain

## 2013-06-16 NOTE — Patient Instructions (Signed)
Urinary Incontinence Urinary incontinence is the involuntary loss of urine from your bladder. CAUSES  There are many causes of urinary incontinence. They include:  Medicines.  Infections.  Prostatic enlargement, leading to overflow of urine from your bladder.  Surgery.  Neurological diseases.  Emotional factors. SIGNS AND SYMPTOMS Urinary Incontinence can be divided into four types: 1. Urge incontinence. Urge incontinence is the involuntary loss of urine before you have the opportunity to go to the bathroom. There is a sudden urge to void but not enough time to reach a bathroom. 2. Stress incontinence. Stress incontinence is the sudden loss of urine with any activity that forces urine to pass. It is commonly caused by anatomical changes to the pelvis and sphincter areas of your body. 3. Overflow incontinence. Overflow incontinence is the loss of urine from an obstructed opening to your bladder. This results in a backup of urine and a resultant buildup of pressure within the bladder. When the pressure within the bladder exceeds the closing pressure of the sphincter, the urine overflows, which causes incontinence, similar to water overflowing a dam. 4. Total incontinence. Total incontinence is the loss of urine as a result of the inability to store urine within your bladder. DIAGNOSIS  Evaluating the cause of incontinence may require:  A thorough and complete medical and obstetric history.  A complete physical exam.  Laboratory tests such as a urine culture and sensitivities. When additional tests are indicated, they can include:  An ultrasound exam.  Kidney and bladder X-rays.  Cystoscopy. This is an exam of the bladder using a narrow scope.  Urodynamic testing to test the nerve function to the bladder and sphincter areas. TREATMENT  Treatment for urinary incontinence depends on the cause:  For urge incontinence caused by a bacterial infection, antibiotics will be prescribed.  If the urge incontinence is related to medicines you take, your health care provider may have you change the medicine.  For stress incontinence, surgery to re-establish anatomical support to the bladder or sphincter, or both, will often correct the condition.  For overflow incontinence caused by an enlarged prostate, an operation to open the channel through the enlarged prostate will allow the flow of urine out of the bladder. In women with fibroids, a hysterectomy may be recommended.  For total incontinence, surgery on your urinary sphincter may help. An artificial urinary sphincter (an inflatable cuff placed around the urethra) may be required. In women who have developed a hole-like passage between their bladder and vagina (vesicovaginal fistula), surgery to close the fistula often is required. HOME CARE INSTRUCTIONS  Normal daily hygiene and the use of pads or adult diapers that are changed regularly will help prevent odors and skin damage.  Avoid caffeine. It can overstimulate your bladder.  Use the bathroom regularly. Try about every 2 3 hours to go to the bathroom, even if you do not feel the need to do so. Take time to empty your bladder completely. After urinating, wait a minute. Then try to urinate again.  For causes involving nerve dysfunction, keep a log of the medicines you take and a journal of the times you go to the bathroom. SEEK MEDICAL CARE IF:  You experience worsening of pain instead of improvement in pain after your procedure.  Your incontinence becomes worse instead of better. SEE IMMEDIATE MEDICAL CARE IF:  You experience fever or shaking chills.  You are unable to pass your urine.  You have redness spreading into your groin or down into your thighs. MAKE   SURE YOU:   Understand these instructions.   Will watch your condition.  Will get help right away if you are not doing well or get worse. Document Released: 02/23/2004 Document Revised: 11/05/2012 Document  Reviewed: 06/24/2012 [employer] Patient Information 2014 [employer].  

## 2013-06-18 ENCOUNTER — Telehealth: Payer: Self-pay | Admitting: *Deleted

## 2013-06-18 DIAGNOSIS — IMO0002 Reserved for concepts with insufficient information to code with codable children: Secondary | ICD-10-CM

## 2013-06-18 NOTE — Telephone Encounter (Signed)
Per Dr. Perrin MalteseGuest- Pt has a age indeterminate mild L1 superior endplate compression fracture- he would like an ortho consult.

## 2014-08-04 ENCOUNTER — Ambulatory Visit (INDEPENDENT_AMBULATORY_CARE_PROVIDER_SITE_OTHER): Payer: Medicare Other | Admitting: Physician Assistant

## 2014-08-04 VITALS — BP 136/82 | HR 104 | Temp 98.0°F | Resp 18 | Ht 65.5 in | Wt 295.8 lb

## 2014-08-04 DIAGNOSIS — H9201 Otalgia, right ear: Secondary | ICD-10-CM

## 2014-08-04 DIAGNOSIS — H9191 Unspecified hearing loss, right ear: Secondary | ICD-10-CM

## 2014-08-04 DIAGNOSIS — M25559 Pain in unspecified hip: Secondary | ICD-10-CM

## 2014-08-04 DIAGNOSIS — I10 Essential (primary) hypertension: Secondary | ICD-10-CM | POA: Diagnosis not present

## 2014-08-04 DIAGNOSIS — H66011 Acute suppurative otitis media with spontaneous rupture of ear drum, right ear: Secondary | ICD-10-CM | POA: Diagnosis not present

## 2014-08-04 MED ORDER — AMOXICILLIN 875 MG PO TABS: 875.0000 mg | ORAL_TABLET | Freq: Two times a day (BID) | ORAL | Status: DC

## 2014-08-04 MED ORDER — AMLODIPINE BESYLATE 2.5 MG PO TABS: 2.5000 mg | ORAL_TABLET | Freq: Every day | ORAL | Status: DC

## 2014-08-04 MED ORDER — MELOXICAM 15 MG PO TABS: 15.0000 mg | ORAL_TABLET | Freq: Every day | ORAL | Status: DC

## 2014-08-04 NOTE — Progress Notes (Signed)
Her pulse ox was 89 .

## 2014-08-04 NOTE — Progress Notes (Signed)
   Subjective:    Patient ID: Lauren Moore, female    DOB: August 27, 1960, 54 y.o.   MRN: [id number]  HPI Patient presents for 6 months of ear pain and yellow drainage from right ear. Does not recall how long ear has been draining. Denies fever or URI sx. Endorses decreased hearing. Ear itches at times. Denies tinnitus. No h/o DM. NKDA.  Has been out of amlodipine for HTN and Norco for hip arthritis for 6 months and would like refills of both. Does not check BP at home. Is working on portion control, but has not made any dietary changes. Denies exercise. Does not smoke.   Review of Systems  Constitutional: Negative for fever, chills and fatigue.  HENT: Positive for ear discharge and ear pain. Negative for congestion, postnasal drip, rhinorrhea, sinus pressure, sore throat and tinnitus.   Respiratory: Negative for cough.   Neurological: Negative for dizziness and headaches.       Objective:   Physical Exam  Constitutional: She is oriented to person, place, and time. She appears well-developed and well-nourished. No distress.  Blood pressure 136/82, pulse 104, temperature 98 F (36.7 C), temperature source Oral, resp. rate 18, height 5' 5.5" (1.664 m), weight 295 lb 12.8 oz (134.174 kg), SpO2 89 %.   HENT:  Head: Normocephalic and atraumatic.  Right Ear: There is drainage and tenderness. No swelling. Tympanic membrane is perforated (2 perforations on TM; most of TM disinegrated to tiny sheath that is also infected). Tympanic membrane is not injected, not scarred, not erythematous, not retracted and not bulging. No middle ear effusion. Decreased hearing is noted.  Left Ear: Hearing, tympanic membrane, external ear and ear canal normal.  Nose: Nose normal.  Mouth/Throat: Oropharynx is clear and moist.  Eyes: Conjunctivae are normal. Right eye exhibits no discharge. Left eye exhibits no discharge. No scleral icterus.  Neck: Normal range of motion. Neck supple.  Pulmonary/Chest: Effort normal.   Lymphadenopathy:    She has no cervical adenopathy.  Neurological: She is alert and oriented to person, place, and time.  Skin: Skin is warm and dry. No rash noted. She is not diaphoretic. No erythema. No pallor.  Psychiatric: She has a normal mood and affect. Her behavior is normal. Judgment and thought content normal.      Assessment & Plan:  1. Suppurative otitis media of right ear with spontaneous tympanic membrane rupture, recurrence not specified, unspecified chronicity 2. Ear pain, right 3. Decreased hearing of right ear - amoxicillin (AMOXIL) 875 MG tablet; Take 1 tablet (875 mg total) by mouth 2 (two) times daily.  Dispense: 20 tablet; Refill: 0 - Ambulatory referral to ENT - Wound culture  4. Hip pain, unspecified laterality - meloxicam (MOBIC) 15 MG tablet; Take 1 tablet (15 mg total) by mouth daily.  Dispense: 30 tablet; Refill: 1  5. Essential hypertension Advised that she needs complete physical. - amLODipine (NORVASC) 2.5 MG tablet; Take 1 tablet (2.5 mg total) by mouth daily.  Dispense: 30 tablet; Refill: 1   Billal Rollo PA-C  [facility] 08/04/2014 2:14 PM

## 2014-08-07 LAB — WOUND CULTURE
Gram Stain: NONE SEEN
Gram Stain: NONE SEEN

## 2014-08-09 ENCOUNTER — Encounter: Payer: Self-pay | Admitting: Physician Assistant

## 2014-08-09 ENCOUNTER — Telehealth: Payer: Self-pay | Admitting: Physician Assistant

## 2014-08-09 NOTE — Telephone Encounter (Signed)
Left vmail. No particular bacteria grown with culture, but due to severity of findings with ear exam please continue antibiotic until complete. Wanted to confirm if able to see ENT and if sx improved.

## 2014-08-19 ENCOUNTER — Telehealth: Payer: Self-pay | Admitting: *Deleted

## 2014-08-19 NOTE — Telephone Encounter (Signed)
Pt called stating someone called her sister today.  After reviewing the last message I advised pt on what the PA stated.  Pt states she has an half of bottle of meds left and the leaking in her ear has cleared up.  She states she feels much better.

## 2014-08-21 NOTE — Telephone Encounter (Signed)
Lvmail for patient to finish antibiotics and the importance of seeing ENT due to the states of her TM. Since you have [facility] and Dr. McKenzie is youRonne Binning will need to have him send referral to [facility], I am willing to have any paperwork sent to him if necessary.

## 2017-09-26 DIAGNOSIS — H9011 Conductive hearing loss, unilateral, right ear, with unrestricted hearing on the contralateral side: Secondary | ICD-10-CM | POA: Insufficient documentation

## 2017-09-26 DIAGNOSIS — H7291 Unspecified perforation of tympanic membrane, right ear: Secondary | ICD-10-CM | POA: Insufficient documentation

## 2017-10-30 ENCOUNTER — Other Ambulatory Visit: Payer: Self-pay

## 2017-10-30 ENCOUNTER — Encounter: Payer: Self-pay | Admitting: Family Medicine

## 2017-10-30 ENCOUNTER — Ambulatory Visit (INDEPENDENT_AMBULATORY_CARE_PROVIDER_SITE_OTHER): Payer: Medicare Other | Admitting: Family Medicine

## 2017-10-30 VITALS — BP 120/78 | HR 87 | Temp 99.0°F | Ht 66.0 in | Wt 268.0 lb

## 2017-10-30 DIAGNOSIS — Z1211 Encounter for screening for malignant neoplasm of colon: Secondary | ICD-10-CM | POA: Insufficient documentation

## 2017-10-30 DIAGNOSIS — R635 Abnormal weight gain: Secondary | ICD-10-CM | POA: Diagnosis not present

## 2017-10-30 DIAGNOSIS — M25551 Pain in right hip: Secondary | ICD-10-CM

## 2017-10-30 DIAGNOSIS — Z23 Encounter for immunization: Secondary | ICD-10-CM | POA: Insufficient documentation

## 2017-10-30 DIAGNOSIS — I10 Essential (primary) hypertension: Secondary | ICD-10-CM | POA: Diagnosis not present

## 2017-10-30 DIAGNOSIS — M25552 Pain in left hip: Secondary | ICD-10-CM | POA: Diagnosis not present

## 2017-10-30 DIAGNOSIS — R748 Abnormal levels of other serum enzymes: Secondary | ICD-10-CM | POA: Diagnosis not present

## 2017-10-30 DIAGNOSIS — G4720 Circadian rhythm sleep disorder, unspecified type: Secondary | ICD-10-CM | POA: Diagnosis not present

## 2017-10-30 DIAGNOSIS — Z1231 Encounter for screening mammogram for malignant neoplasm of breast: Secondary | ICD-10-CM | POA: Diagnosis not present

## 2017-10-30 DIAGNOSIS — R7309 Other abnormal glucose: Secondary | ICD-10-CM | POA: Diagnosis not present

## 2017-10-30 DIAGNOSIS — R358 Other polyuria: Secondary | ICD-10-CM | POA: Diagnosis not present

## 2017-10-30 DIAGNOSIS — Z1159 Encounter for screening for other viral diseases: Secondary | ICD-10-CM | POA: Diagnosis not present

## 2017-10-30 DIAGNOSIS — R3589 Other polyuria: Secondary | ICD-10-CM

## 2017-10-30 DIAGNOSIS — Z114 Encounter for screening for human immunodeficiency virus [HIV]: Secondary | ICD-10-CM | POA: Diagnosis not present

## 2017-10-30 LAB — POCT GLYCOSYLATED HEMOGLOBIN (HGB A1C): HBA1C, POC (CONTROLLED DIABETIC RANGE): 4.9 % (ref 0.0–7.0)

## 2017-10-30 MED ORDER — TETANUS-DIPHTH-ACELL PERTUSSIS 5-2.5-18.5 LF-MCG/0.5 IM SUSP: 0.5000 mL | Freq: Once | INTRAMUSCULAR | 0 refills | Status: AC

## 2017-10-30 MED ORDER — ZOSTER VAC RECOMB ADJUVANTED 50 MCG/0.5ML IM SUSR: 0.5000 mL | Freq: Once | INTRAMUSCULAR | 0 refills | Status: AC

## 2017-10-30 NOTE — Assessment & Plan Note (Signed)
Patient is due for colonoscopy. Referral to GI placed. Denies any blood in stool or abdominal pain.

## 2017-10-30 NOTE — Assessment & Plan Note (Addendum)
Patient describes polyuria and polydipsia. No Diabetes work up on file. No dysuria or hesitancy that would be more concerning for GU infection. --Order A1c --Follow up on CMP

## 2017-10-30 NOTE — Assessment & Plan Note (Signed)
Patient reports waking up from her sleep. Patient is obese, would suspect OSA vs OHS given body habitus. Will formally assess with sleep study.

## 2017-10-30 NOTE — Assessment & Plan Note (Signed)
No physical activity, poor diet. Suspect weight gain is secondary to poor lifestyle. Will check thyroid function.  --Order TSH --Will consider nutrition counseling

## 2017-10-30 NOTE — Assessment & Plan Note (Signed)
BP today 120/78. At goal. Currently not taking any prescription. Was on amlodipine in the past. Will continue to monitor. --F/u on CMP and CBC

## 2017-10-30 NOTE — Assessment & Plan Note (Addendum)
Xray in 2015 did not show any sign of osteoarthritis in her right hip. Patient continue to complain of intermittent hip pain with left greater than right. Will have repeat imaging though hip pain could be secondary to lumbar pain given history of L1 vertebral fracture seen on imaging in 2015. --Xray bilateral hip 2 views --X ray lumbar

## 2017-10-30 NOTE — Patient Instructions (Addendum)
It was great seeing you today! We have addressed the following issues today  1. We will schedule a colonoscopy and Pap smear for you today. 2.  Call the breast center to schedule your mammogram. 3. We will follow up on today Blood work  4 Schedule an appointment to see me in 1 month to discuss the results and management. 5. X ray of your back and hip have been ordered. 6. Sleep study to rule out obstructive sleep apnea have been ordered.  If we did any lab work today, and the results require attention, either me or my nurse will get in touch with you. If everything is normal, you will get a letter in mail and a message via . If you don't hear from [place] in two weeks, please give [place] a call. Otherwise, we look forward to seeing you again at your next visit. If you have any questions or concerns before then, please call the clinic at [phone].  Please bring all your medications to every doctors visit  Sign up for My Chart to have easy access to your labs results, and communication with your Primary care physician. Please ask Front Desk for some assistance.   Please check-out at the front desk before leaving the clinic.    Take Care,   Dr. Sydnee Cabal
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
## 2017-10-31 ENCOUNTER — Ambulatory Visit (HOSPITAL_COMMUNITY)
Admission: RE | Admit: 2017-10-31 | Discharge: 2017-10-31 | Disposition: A | Discharge status: Home / Self Care | Payer: Medicare Other | Source: Ambulatory Visit | Attending: Family Medicine | Admitting: Family Medicine

## 2017-10-31 DIAGNOSIS — M25551 Pain in right hip: Secondary | ICD-10-CM | POA: Diagnosis present

## 2017-10-31 DIAGNOSIS — M25552 Pain in left hip: Secondary | ICD-10-CM | POA: Insufficient documentation

## 2017-10-31 DIAGNOSIS — M4856XA Collapsed vertebra, not elsewhere classified, lumbar region, initial encounter for fracture: Secondary | ICD-10-CM | POA: Insufficient documentation

## 2017-10-31 LAB — CBC WITH DIFFERENTIAL/PLATELET
BASOS: 1 %
Basophils Absolute: 0.1 10*3/uL (ref 0.0–0.2)
EOS (ABSOLUTE): 0.1 10*3/uL (ref 0.0–0.4)
Eos: 1 %
Hematocrit: 42.9 % (ref 34.0–46.6)
Hemoglobin: 14.5 g/dL (ref 11.1–15.9)
IMMATURE GRANS (ABS): 0 10*3/uL (ref 0.0–0.1)
IMMATURE GRANULOCYTES: 0 %
Lymphocytes Absolute: 1.9 10*3/uL (ref 0.7–3.1)
Lymphs: 23 %
MCH: 29.4 pg (ref 26.6–33.0)
MCHC: 33.8 g/dL (ref 31.5–35.7)
MCV: 87 fL (ref 79–97)
MONOS ABS: 0.7 10*3/uL (ref 0.1–0.9)
Monocytes: 8 %
NEUTROS PCT: 67 %
Neutrophils Absolute: 5.6 10*3/uL (ref 1.4–7.0)
PLATELETS: 213 10*3/uL (ref 150–450)
RBC: 4.93 x10E6/uL (ref 3.77–5.28)
RDW: 11.9 % — AB (ref 12.3–15.4)
WBC: 8.3 10*3/uL (ref 3.4–10.8)

## 2017-10-31 LAB — COMPREHENSIVE METABOLIC PANEL
A/G RATIO: 1.1 — AB (ref 1.2–2.2)
ALBUMIN: 4.1 g/dL (ref 3.5–5.5)
ALK PHOS: 57 IU/L (ref 39–117)
ALT: 9 IU/L (ref 0–32)
AST: 13 IU/L (ref 0–40)
BUN/Creatinine Ratio: 13 (ref 9–23)
BUN: 11 mg/dL (ref 6–24)
Bilirubin Total: 0.5 mg/dL (ref 0.0–1.2)
CALCIUM: 9.4 mg/dL (ref 8.7–10.2)
CO2: 26 mmol/L (ref 20–29)
CREATININE: 0.82 mg/dL (ref 0.57–1.00)
Chloride: 102 mmol/L (ref 96–106)
GFR calc Af Amer: 93 mL/min/{1.73_m2} (ref >59)
GFR, EST NON AFRICAN AMERICAN: 81 mL/min/{1.73_m2} (ref >59)
GLUCOSE: 77 mg/dL (ref 65–99)
Globulin, Total: 3.6 g/dL (ref 1.5–4.5)
POTASSIUM: 4.4 mmol/L (ref 3.5–5.2)
SODIUM: 142 mmol/L (ref 134–144)
Total Protein: 7.7 g/dL (ref 6.0–8.5)

## 2017-10-31 LAB — LIPID PANEL
CHOL/HDL RATIO: 3.1 ratio (ref 0.0–4.4)
Cholesterol, Total: 171 mg/dL (ref 100–199)
HDL: 56 mg/dL (ref >39)
LDL Calculated: 93 mg/dL (ref 0–99)
Triglycerides: 109 mg/dL (ref 0–149)
VLDL Cholesterol Cal: 22 mg/dL (ref 5–40)

## 2017-10-31 LAB — HIV ANTIBODY (ROUTINE TESTING W REFLEX): HIV Screen 4th Generation wRfx: NONREACTIVE

## 2017-10-31 LAB — TSH: TSH: 1.05 u[IU]/mL (ref 0.450–4.500)

## 2017-10-31 LAB — HEPATITIS C ANTIBODY: Hep C Virus Ab: <0.1 s/co ratio (ref 0.0–0.9)

## 2017-11-07 ENCOUNTER — Telehealth: Payer: Self-pay | Admitting: Gastroenterology

## 2017-11-07 NOTE — Telephone Encounter (Signed)
Received referral for patient to have next colon at our office. I called and spoke with patients sister who states pts last colon was 2014 at [facility] with Dr.Hayes. Patient not requesting certain MD. DOD for referral date of 4.2.19 is Dr. Barron Alvine. Records in Epic under procedures. Please review and advise at your convenience.

## 2017-11-11 NOTE — H&P (Signed)
  HPI:   Chief Complaint  Patient presents with  . Ear Problem   Lauren Moore is a 57 y.o. female who presents as a new patient for right ear concerns. Last year she reports being diagnosed with an ear infection. It began draining at that time and hearing was reduced. Hearing has been diminished in this ear ever since and has been draining off and on. She only uses a Q-tip in the ear when it drains; does not have any drops. The last time it drained was a week ago. Prior to last year, the ears never gave her any trouble.   No history of recurrent ear infections, ear surgeries or significant noise exposures.   No PMH of diabetes, asthma, bleeding disorder or obstructive sleep apnea.  Non-smoker.   PMH/Meds/All/SocHx/FamHx/ROS:   No past medical history on file.  No past surgical history on file.  No family history of bleeding disorders, wound healing problems or difficulty with anesthesia.   Social History   Social History  . Marital status: Single  Spouse name: N/A  . Number of children: N/A  . Years of education: N/A   Occupational History  . Not on file.   Social History Main Topics  . Smoking status: Never Smoker  . Smokeless tobacco: Never Used  . Alcohol use Yes  Comment: social  . Drug use: No  . Sexual activity: Not on file   Other Topics Concern  . Not on file   Social History Narrative  . No narrative on file   No current outpatient prescriptions on file.  A complete ROS was performed with pertinent positives/negatives noted in the HPI. The remainder of the ROS are negative.   Physical Exam:   BP 130/81  Pulse 90  Resp 16  Ht 1.645 m (5' 4.75")  Wt 122.5 kg (270 lb)  BMI 45.28 kg/m   General Awake, at baseline alertness during examination.  Eyes No scleral icterus or conjunctival hemorrhage. Globe position appears normal. EOMI.  Right Ear EAC patent, TM with near total perforation, clean and dry.  Left Ear EAC patent, TM intact w/o  inflammation. Middle ear well aerated.  Nose Patent, no polyps or masses seen on anterior rhinoscopy.  Oral cavity No mucosal lesions or tumors seen. Tongue midline. Missing teeth.  Oropharynx Symmetric tonsils.  Neck No abnormal cervical lymphadenopathy. No thyromegaly. No thyroid masses palpated.  Cardio-vascular No cyanosis.  Pulmonary No audible stridor. Breathing easily with no labor.  Neuro Symmetric facial movement.  Psychiatry Appropriate affect and mood for clinic visit.   Independent Review of Additional Tests or Records:  None  Procedures:  None  Impression & Plans:  Lauren Moore is a 57 y.o. female with a near total tympanic membrane perforation of the right ear, presumably from otitis media. Currently ear appears stable. Recommend she have Ciprodex on hand to use when drainage occurs. Advised that she keep this ear dry while showering and avoid using Q-tips.   We discussed options for her including tympanoplasty versus hearing amplification. She is reluctant to undergo ear surgery, prefers a hearing aid. We will have her scheduled for audiogram and hearing aid consultation at her convenience.

## 2017-11-13 NOTE — Telephone Encounter (Signed)
I reviewed her last colonoscopy from May 2014 by Dr. Madilyn Fireman which was notable for sigmoid diverticulosis and internal hemorrhoids, but o/w normal with recommendation for repeat in 10 years (2024). Unless there has been a change in her family or personal medical history, or if she is having GI symptoms that should change these recommendations, this seems an appropriate interval. She can always schedule an appt with me in the [facility] to discuss in detail. Thanks.

## 2017-11-14 ENCOUNTER — Other Ambulatory Visit: Payer: Self-pay

## 2017-11-14 ENCOUNTER — Ambulatory Visit (INDEPENDENT_AMBULATORY_CARE_PROVIDER_SITE_OTHER): Payer: Medicare Other | Admitting: Family Medicine

## 2017-11-14 ENCOUNTER — Other Ambulatory Visit (HOSPITAL_COMMUNITY)
Admission: RE | Admit: 2017-11-14 | Discharge: 2017-11-14 | Disposition: A | Discharge status: Home / Self Care | Payer: Medicare Other | Source: Ambulatory Visit | Attending: Family Medicine | Admitting: Family Medicine

## 2017-11-14 VITALS — BP 102/65 | HR 100 | Temp 98.3°F | Wt 270.0 lb

## 2017-11-14 DIAGNOSIS — Z202 Contact with and (suspected) exposure to infections with a predominantly sexual mode of transmission: Secondary | ICD-10-CM | POA: Diagnosis not present

## 2017-11-14 DIAGNOSIS — Z124 Encounter for screening for malignant neoplasm of cervix: Secondary | ICD-10-CM | POA: Diagnosis not present

## 2017-11-14 DIAGNOSIS — Z114 Encounter for screening for human immunodeficiency virus [HIV]: Secondary | ICD-10-CM

## 2017-11-14 DIAGNOSIS — Z01419 Encounter for gynecological examination (general) (routine) without abnormal findings: Secondary | ICD-10-CM

## 2017-11-14 NOTE — Progress Notes (Signed)
Patient ID: SHAQUITA FORT, female   DOB: 04-14-1960, 57 y.o.   MRN: [id number] Subjective:     LYDA COLCORD is a 57 y.o. female here for a routine exam.  Current complaints: Chronic back pain, no GU concern.  Personal health questionnaire reviewed: no.   Gynecologic History No LMP recorded. Patient is postmenopausal. Contraception: post menopausal status Last Pap: No recent. Results were: Unknown Last mammogram: No recent. Results were: unknown  Obstetric History OB History  No data available     The following portions of the patient's history were reviewed and updated as appropriate: allergies, current medications, past family history, past medical history, past social history, past surgical history and problem list.  Review of Systems Pertinent items noted in HPI and remainder of comprehensive ROS otherwise negative.    Objective:    BP 102/65   Pulse 100   Temp 98.3 F (36.8 C) (Oral)   Wt 270 lb (122.5 kg)   SpO2 97%   BMI 43.58 kg/m  General appearance: alert and cooperative Lungs: clear to auscultation bilaterally Heart: regular rate and rhythm, S1, S2 normal, no murmur, click, rub or gallop Abdomen: soft, non-tender; bowel sounds normal; no masses,  no organomegaly Pelvic: cervix normal in appearance, external genitalia normal, no adnexal masses or tenderness, no cervical motion tenderness, rectovaginal septum normal, uterus normal size, shape, and consistency and vagina normal without discharge Neurologic: Grossly normal    Assessment:    Healthy female exam.   STD check Plan:  PAP completed, we will contact with result soon. She requested STD screen. She is sexually active with her partner of many years. Just wanted to get checked.  Mammogram ordered. We also gave her mammogram slip to schedule her appointment.    F/U with PCP soon for management of chronic back pain.

## 2017-11-14 NOTE — Patient Instructions (Signed)

## 2017-11-15 LAB — CYTOLOGY - PAP
Chlamydia: NEGATIVE
Diagnosis: NEGATIVE
HPV (WINDOPATH): NOT DETECTED
Neisseria Gonorrhea: NEGATIVE
Trichomonas: NEGATIVE

## 2017-11-15 LAB — HIV ANTIBODY (ROUTINE TESTING W REFLEX): HIV Screen 4th Generation wRfx: NONREACTIVE

## 2017-11-15 LAB — RPR: RPR: NONREACTIVE

## 2017-11-18 ENCOUNTER — Encounter (HOSPITAL_BASED_OUTPATIENT_CLINIC_OR_DEPARTMENT_OTHER): Payer: Self-pay | Admitting: *Deleted

## 2017-11-18 ENCOUNTER — Other Ambulatory Visit: Payer: Self-pay

## 2017-11-18 NOTE — Progress Notes (Signed)
Called Pt's sister, [name],  to let her know time and date of surgical procedure. States pt will be going home with her and she will care for her for 24 hours post procedure.

## 2017-11-25 ENCOUNTER — Ambulatory Visit (HOSPITAL_BASED_OUTPATIENT_CLINIC_OR_DEPARTMENT_OTHER)
Admission: RE | Admit: 2017-11-25 | Discharge: 2017-11-25 | Disposition: A | Discharge status: Home / Self Care | Payer: Medicare Other | Source: Ambulatory Visit | Attending: Otolaryngology | Admitting: Otolaryngology

## 2017-11-25 ENCOUNTER — Encounter (HOSPITAL_BASED_OUTPATIENT_CLINIC_OR_DEPARTMENT_OTHER)
Admission: RE | Disposition: A | Discharge status: Home / Self Care | Payer: Self-pay | Source: Ambulatory Visit | Attending: Otolaryngology

## 2017-11-25 ENCOUNTER — Encounter (HOSPITAL_BASED_OUTPATIENT_CLINIC_OR_DEPARTMENT_OTHER): Payer: Self-pay

## 2017-11-25 ENCOUNTER — Ambulatory Visit (HOSPITAL_BASED_OUTPATIENT_CLINIC_OR_DEPARTMENT_OTHER): Payer: Medicare Other | Admitting: Anesthesiology

## 2017-11-25 ENCOUNTER — Other Ambulatory Visit: Payer: Self-pay

## 2017-11-25 DIAGNOSIS — Z79899 Other long term (current) drug therapy: Secondary | ICD-10-CM | POA: Insufficient documentation

## 2017-11-25 DIAGNOSIS — H6691 Otitis media, unspecified, right ear: Secondary | ICD-10-CM | POA: Diagnosis not present

## 2017-11-25 DIAGNOSIS — Z6841 Body Mass Index (BMI) 40.0 and over, adult: Secondary | ICD-10-CM | POA: Insufficient documentation

## 2017-11-25 DIAGNOSIS — Z87891 Personal history of nicotine dependence: Secondary | ICD-10-CM | POA: Insufficient documentation

## 2017-11-25 DIAGNOSIS — Z8673 Personal history of transient ischemic attack (TIA), and cerebral infarction without residual deficits: Secondary | ICD-10-CM | POA: Diagnosis not present

## 2017-11-25 DIAGNOSIS — I10 Essential (primary) hypertension: Secondary | ICD-10-CM | POA: Diagnosis not present

## 2017-11-25 DIAGNOSIS — H7291 Unspecified perforation of tympanic membrane, right ear: Secondary | ICD-10-CM | POA: Insufficient documentation

## 2017-11-25 HISTORY — PX: TYMPANOPLASTY: SHX33

## 2017-11-25 SURGERY — TYMPANOPLASTY
Anesthesia: General | Site: Ear | Laterality: Right

## 2017-11-25 MED ORDER — MIDAZOLAM HCL 5 MG/5ML IJ SOLN
INTRAMUSCULAR | Status: DC | PRN
  Administered 2017-11-25: 2 mg via INTRAVENOUS

## 2017-11-25 MED ORDER — LACTATED RINGERS IV SOLN
INTRAVENOUS | Status: DC
  Administered 2017-11-25 (×2): via INTRAVENOUS

## 2017-11-25 MED ORDER — FENTANYL CITRATE (PF) 100 MCG/2ML IJ SOLN
INTRAMUSCULAR | Status: AC
  Filled 2017-11-25: qty 2

## 2017-11-25 MED ORDER — LIDOCAINE-EPINEPHRINE 1 %-1:100000 IJ SOLN
INTRAMUSCULAR | Status: AC
  Filled 2017-11-25: qty 1

## 2017-11-25 MED ORDER — FENTANYL CITRATE (PF) 100 MCG/2ML IJ SOLN
INTRAMUSCULAR | Status: DC | PRN
  Administered 2017-11-25 (×2): 50 ug via INTRAVENOUS

## 2017-11-25 MED ORDER — LIDOCAINE 2% (20 MG/ML) 5 ML SYRINGE
INTRAMUSCULAR | Status: AC
  Filled 2017-11-25: qty 5

## 2017-11-25 MED ORDER — PHENYLEPHRINE HCL 10 MG/ML IJ SOLN
INTRAMUSCULAR | Status: AC
  Filled 2017-11-25: qty 1

## 2017-11-25 MED ORDER — CIPROFLOXACIN-DEXAMETHASONE 0.3-0.1 % OT SUSP
OTIC | Status: AC
  Filled 2017-11-25: qty 7.5

## 2017-11-25 MED ORDER — PROPOFOL 10 MG/ML IV BOLUS
INTRAVENOUS | Status: DC | PRN
  Administered 2017-11-25: 200 mg via INTRAVENOUS

## 2017-11-25 MED ORDER — MIDAZOLAM HCL 2 MG/2ML IJ SOLN: 1.0000 mg | INTRAMUSCULAR | Status: DC | PRN

## 2017-11-25 MED ORDER — METHYLENE BLUE 0.5 % INJ SOLN
INTRAVENOUS | Status: DC | PRN
  Administered 2017-11-25: 09:00:00 via TOPICAL

## 2017-11-25 MED ORDER — PHENYLEPHRINE HCL 10 MG/ML IJ SOLN
INTRAMUSCULAR | Status: DC | PRN
  Administered 2017-11-25: 80 ug via INTRAVENOUS

## 2017-11-25 MED ORDER — ONDANSETRON HCL 4 MG/2ML IJ SOLN
INTRAMUSCULAR | Status: DC | PRN
  Administered 2017-11-25: 4 mg via INTRAVENOUS

## 2017-11-25 MED ORDER — MIDAZOLAM HCL 2 MG/2ML IJ SOLN
INTRAMUSCULAR | Status: AC
  Filled 2017-11-25: qty 2

## 2017-11-25 MED ORDER — CIPROFLOXACIN-DEXAMETHASONE 0.3-0.1 % OT SUSP
OTIC | Status: DC | PRN
  Administered 2017-11-25: 4 [drp] via OTIC

## 2017-11-25 MED ORDER — OXYCODONE HCL 5 MG/5ML PO SOLN: 5.0000 mg | Freq: Once | ORAL | Status: DC | PRN

## 2017-11-25 MED ORDER — DEXAMETHASONE SODIUM PHOSPHATE 10 MG/ML IJ SOLN
INTRAMUSCULAR | Status: AC
  Filled 2017-11-25: qty 1

## 2017-11-25 MED ORDER — PHENYLEPHRINE 40 MCG/ML (10ML) SYRINGE FOR IV PUSH (FOR BLOOD PRESSURE SUPPORT)
PREFILLED_SYRINGE | INTRAVENOUS | Status: AC
  Filled 2017-11-25: qty 10

## 2017-11-25 MED ORDER — SCOPOLAMINE 1 MG/3DAYS TD PT72: 1.0000 | MEDICATED_PATCH | Freq: Once | TRANSDERMAL | Status: DC | PRN

## 2017-11-25 MED ORDER — ATROPINE SULFATE 0.4 MG/ML IJ SOLN
INTRAMUSCULAR | Status: AC
  Filled 2017-11-25: qty 1

## 2017-11-25 MED ORDER — DEXAMETHASONE SODIUM PHOSPHATE 4 MG/ML IJ SOLN
INTRAMUSCULAR | Status: DC | PRN
  Administered 2017-11-25: 10 mg via INTRAVENOUS

## 2017-11-25 MED ORDER — BACITRACIN ZINC 500 UNIT/GM EX OINT
TOPICAL_OINTMENT | CUTANEOUS | Status: AC
  Filled 2017-11-25: qty 28.35

## 2017-11-25 MED ORDER — ONDANSETRON HCL 4 MG/2ML IJ SOLN
INTRAMUSCULAR | Status: AC
  Filled 2017-11-25: qty 2

## 2017-11-25 MED ORDER — EPHEDRINE SULFATE 50 MG/ML IJ SOLN
INTRAMUSCULAR | Status: DC | PRN
  Administered 2017-11-25: 25 mg via INTRAVENOUS

## 2017-11-25 MED ORDER — SODIUM CHLORIDE 0.9 % IV SOLN: INTRAVENOUS | Status: DC | PRN

## 2017-11-25 MED ORDER — LIDOCAINE-EPINEPHRINE 1 %-1:100000 IJ SOLN
INTRAMUSCULAR | Status: DC | PRN
  Administered 2017-11-25: 6 mL

## 2017-11-25 MED ORDER — BACITRACIN ZINC 500 UNIT/GM EX OINT
TOPICAL_OINTMENT | CUTANEOUS | Status: DC | PRN
  Administered 2017-11-25: 1 via TOPICAL

## 2017-11-25 MED ORDER — PROPOFOL 500 MG/50ML IV EMUL
INTRAVENOUS | Status: DC | PRN
  Administered 2017-11-25: 25 ug/kg/min via INTRAVENOUS

## 2017-11-25 MED ORDER — ONDANSETRON HCL 4 MG/2ML IJ SOLN: 4.0000 mg | Freq: Once | INTRAMUSCULAR | Status: DC | PRN

## 2017-11-25 MED ORDER — FENTANYL CITRATE (PF) 100 MCG/2ML IJ SOLN: 25.0000 ug | INTRAMUSCULAR | Status: DC | PRN

## 2017-11-25 MED ORDER — METHYLENE BLUE 0.5 % INJ SOLN
INTRAVENOUS | Status: AC
  Filled 2017-11-25: qty 10

## 2017-11-25 MED ORDER — OXYCODONE HCL 5 MG PO TABS: 5.0000 mg | ORAL_TABLET | Freq: Once | ORAL | Status: DC | PRN

## 2017-11-25 MED ORDER — EPHEDRINE 5 MG/ML INJ
INTRAVENOUS | Status: AC
  Filled 2017-11-25: qty 10

## 2017-11-25 MED ORDER — FENTANYL CITRATE (PF) 100 MCG/2ML IJ SOLN: 50.0000 ug | INTRAMUSCULAR | Status: DC | PRN

## 2017-11-25 MED ORDER — EPINEPHRINE 30 MG/30ML IJ SOLN
INTRAMUSCULAR | Status: AC
  Filled 2017-11-25: qty 1

## 2017-11-25 MED ORDER — PROMETHAZINE HCL 25 MG RE SUPP: 25.0000 mg | Freq: Four times a day (QID) | RECTAL | 1 refills | Status: DC | PRN

## 2017-11-25 MED ORDER — CIPROFLOXACIN-DEXAMETHASONE 0.3-0.1 % OT SUSP: 3.0000 [drp] | Freq: Three times a day (TID) | OTIC | 2 refills | Status: DC

## 2017-11-25 MED ORDER — HYDROCODONE-ACETAMINOPHEN 7.5-325 MG PO TABS: 1.0000 | ORAL_TABLET | Freq: Four times a day (QID) | ORAL | 0 refills | Status: DC | PRN

## 2017-11-25 MED ORDER — GLYCOPYRROLATE PF 0.2 MG/ML IJ SOSY
PREFILLED_SYRINGE | INTRAMUSCULAR | Status: AC
  Filled 2017-11-25: qty 1

## 2017-11-25 MED ORDER — LIDOCAINE HCL (CARDIAC) PF 100 MG/5ML IV SOSY
PREFILLED_SYRINGE | INTRAVENOUS | Status: DC | PRN
  Administered 2017-11-25: 30 mg via INTRAVENOUS

## 2017-11-25 MED ORDER — PROPOFOL 10 MG/ML IV BOLUS
INTRAVENOUS | Status: AC
  Filled 2017-11-25: qty 40

## 2017-11-25 MED ORDER — PROPOFOL 10 MG/ML IV BOLUS
INTRAVENOUS | Status: AC
  Filled 2017-11-25: qty 20

## 2017-11-25 MED ORDER — GLYCOPYRROLATE 0.2 MG/ML IJ SOLN
INTRAMUSCULAR | Status: DC | PRN
  Administered 2017-11-25: 0.1 mg via INTRAVENOUS

## 2017-11-25 MED ORDER — SUCCINYLCHOLINE CHLORIDE 200 MG/10ML IV SOSY
PREFILLED_SYRINGE | INTRAVENOUS | Status: AC
  Filled 2017-11-25: qty 10

## 2017-11-25 SURGICAL SUPPLY — 64 items
BENZOIN TINCTURE PRP APPL 2/3 (GAUZE/BANDAGES/DRESSINGS) IMPLANT
BLADE CLIPPER SURG (BLADE) IMPLANT
BLADE NEEDLE 3 SS STRL (BLADE) IMPLANT
BLADE NEEDLE 3MM SS STRL (BLADE)
BNDG CONFORM 3 STRL LF (GAUZE/BANDAGES/DRESSINGS) IMPLANT
BNDG GAUZE ELAST 4 BULKY (GAUZE/BANDAGES/DRESSINGS) IMPLANT
CANISTER SUCT 1200ML W/VALVE (MISCELLANEOUS) ×3 IMPLANT
CLEANER CAUTERY TIP 5X5 PAD (MISCELLANEOUS) ×1 IMPLANT
CLOSURE WOUND 1/2 X4 (GAUZE/BANDAGES/DRESSINGS)
COTTONBALL LRG STERILE PKG (GAUZE/BANDAGES/DRESSINGS) ×3 IMPLANT
COVER WAND RF STERILE (DRAPES) IMPLANT
DECANTER SPIKE VIAL GLASS SM (MISCELLANEOUS) ×3 IMPLANT
DERMABOND ADVANCED (GAUZE/BANDAGES/DRESSINGS) ×2
DERMABOND ADVANCED .7 DNX12 (GAUZE/BANDAGES/DRESSINGS) ×1 IMPLANT
DRAPE EENT ADH APERT 31X51 STR (DRAPES) ×3 IMPLANT
DRAPE INCISE 23X17 IOBAN STRL (DRAPES)
DRAPE INCISE IOBAN 23X17 STRL (DRAPES) IMPLANT
DRAPE MICROSCOPE URBAN (DRAPES) IMPLANT
DRAPE MICROSCOPE WILD 40.5X102 (DRAPES) IMPLANT
DROPPER MEDICINE STER 1.5ML LF (MISCELLANEOUS) IMPLANT
DRSG GLASSCOCK MASTOID ADT (GAUZE/BANDAGES/DRESSINGS) ×3 IMPLANT
DRSG GLASSCOCK MASTOID PED (GAUZE/BANDAGES/DRESSINGS) IMPLANT
DRSG TELFA 3X8 NADH (GAUZE/BANDAGES/DRESSINGS) IMPLANT
ELECT COATED BLADE 2.86 ST (ELECTRODE) ×3 IMPLANT
ELECT REM PT RETURN 9FT ADLT (ELECTROSURGICAL) ×3
ELECTRODE REM PT RTRN 9FT ADLT (ELECTROSURGICAL) ×1 IMPLANT
GAUZE 4X4 16PLY RFD (DISPOSABLE) IMPLANT
GAUZE SPONGE 4X4 12PLY STRL (GAUZE/BANDAGES/DRESSINGS) IMPLANT
GAUZE SPONGE 4X4 12PLY STRL LF (GAUZE/BANDAGES/DRESSINGS) IMPLANT
GLOVE BIO SURGEON STRL SZ 6.5 (GLOVE) ×2 IMPLANT
GLOVE BIO SURGEONS STRL SZ 6.5 (GLOVE) ×1
GLOVE BIOGEL PI IND STRL 7.0 (GLOVE) ×2 IMPLANT
GLOVE BIOGEL PI INDICATOR 7.0 (GLOVE) ×4
GLOVE ECLIPSE 7.5 STRL STRAW (GLOVE) ×6 IMPLANT
GOWN STRL REUS W/ TWL LRG LVL3 (GOWN DISPOSABLE) ×1 IMPLANT
GOWN STRL REUS W/ TWL XL LVL3 (GOWN DISPOSABLE) ×1 IMPLANT
GOWN STRL REUS W/TWL LRG LVL3 (GOWN DISPOSABLE) ×2
GOWN STRL REUS W/TWL XL LVL3 (GOWN DISPOSABLE) ×2
IV CATH AUTO 14GX1.75 SAFE ORG (IV SOLUTION) IMPLANT
IV SET EXT 30 76VOL 4 MALE LL (IV SETS) ×3 IMPLANT
NDL SAFETY ECLIPSE 18X1.5 (NEEDLE) ×1 IMPLANT
NEEDLE HYPO 18GX1.5 SHARP (NEEDLE) ×2
NEEDLE PRECISIONGLIDE 27X1.5 (NEEDLE) ×3 IMPLANT
NS IRRIG 1000ML POUR BTL (IV SOLUTION) ×3 IMPLANT
PACK BASIN DAY SURGERY FS (CUSTOM PROCEDURE TRAY) ×3 IMPLANT
PACK ENT DAY SURGERY (CUSTOM PROCEDURE TRAY) ×3 IMPLANT
PAD CLEANER CAUTERY TIP 5X5 (MISCELLANEOUS) ×2
PENCIL FOOT CONTROL (ELECTRODE) ×3 IMPLANT
SHEET MEDIUM DRAPE 40X70 STRL (DRAPES) IMPLANT
SLEEVE SCD COMPRESS KNEE MED (MISCELLANEOUS) ×3 IMPLANT
SPONGE SURGIFOAM ABS GEL 12-7 (HEMOSTASIS) ×3 IMPLANT
STRIP CLOSURE SKIN 1/2X4 (GAUZE/BANDAGES/DRESSINGS) IMPLANT
SUT CHROMIC 3 0 PS 2 (SUTURE) ×3 IMPLANT
SUT CHROMIC 4 0 P 3 18 (SUTURE) IMPLANT
SUT CHROMIC 4 0 PS 2 18 (SUTURE) IMPLANT
SUT ETHILON 5 0 P 3 18 (SUTURE)
SUT NYLON ETHILON 5-0 P-3 1X18 (SUTURE) IMPLANT
SUT PLAIN 5 0 P 3 18 (SUTURE) IMPLANT
SUT VIC AB 3-0 FS2 27 (SUTURE) IMPLANT
SYR 5ML LL (SYRINGE) ×3 IMPLANT
SYR BULB 3OZ (MISCELLANEOUS) IMPLANT
TOWEL GREEN STERILE FF (TOWEL DISPOSABLE) ×3 IMPLANT
TRAY DSU PREP LF (CUSTOM PROCEDURE TRAY) ×3 IMPLANT
TUBING IRRIGATION (MISCELLANEOUS) IMPLANT

## 2017-11-25 NOTE — Anesthesia Postprocedure Evaluation (Signed)
Anesthesia Post Note  Patient: Jakerria Kingbird  Procedure(s) Performed: TYMPANOPLASTY (Right Ear)     Patient location during evaluation: PACU Anesthesia Type: General Level of consciousness: awake and alert Pain management: pain level controlled Vital Signs Assessment: post-procedure vital signs reviewed and stable Respiratory status: spontaneous breathing, nonlabored ventilation and respiratory function stable Cardiovascular status: blood pressure returned to baseline and stable Postop Assessment: no apparent nausea or vomiting Anesthetic complications: no    Last Vitals:  Vitals:   11/25/17 1015 11/25/17 1047  BP: (!) 149/89 (!) 157/95  Pulse: (!) 101 (!) 101  Resp: 10 16  Temp:  36.4 C  SpO2: 100% 94%    Last Pain:  Vitals:   11/25/17 1047  TempSrc: Oral  PainSc: 3                  Lucretia Kern

## 2017-11-25 NOTE — Interval H&P Note (Signed)
History and Physical Interval Note:  11/25/2017 7:21 AM  Lauren Moore  has presented today for surgery, with the diagnosis of right tympanoplasty  The various methods of treatment have been discussed with the patient and family. After consideration of risks, benefits and other options for treatment, the patient has consented to  Procedure(s): TYMPANOPLASTY (Right) as a surgical intervention .  The patient's history has been reviewed, patient examined, no change in status, stable for surgery.  I have reviewed the patient's chart and labs.  Questions were answered to the patient's satisfaction.     Serena Colonel

## 2017-11-25 NOTE — Anesthesia Procedure Notes (Signed)
Procedure Name: LMA Insertion Date/Time: 11/25/2017 8:20 AM Performed by: Etna Green Desanctis, CRNA Pre-anesthesia Checklist: Patient identified, Emergency Drugs available, Suction available, Patient being monitored and Timeout performed Patient Re-evaluated:Patient Re-evaluated prior to induction Oxygen Delivery Method: Circle system utilized Preoxygenation: Pre-oxygenation with 100% oxygen Induction Type: IV induction Ventilation: Mask ventilation without difficulty LMA: LMA flexible inserted LMA Size: 4.0 Number of attempts: 1 Airway Equipment and Method: Bite block Placement Confirmation: positive ETCO2 Tube secured with: Tape Dental Injury: Teeth and Oropharynx as per pre-operative assessment

## 2017-11-25 NOTE — Transfer of Care (Signed)
Immediate Anesthesia Transfer of Care Note  Patient: Lauren Moore  Procedure(s) Performed: TYMPANOPLASTY (Right )  Patient Location: PACU  Anesthesia Type:General  Level of Consciousness: awake and patient cooperative  Airway & Oxygen Therapy: Patient Spontanous Breathing and Patient connected to face mask oxygen  Post-op Assessment: Report given to RN and Post -op Vital signs reviewed and stable  Post vital signs: Reviewed and stable  Last Vitals:  Vitals Value Taken Time  BP    Temp    Pulse    Resp    SpO2      Last Pain:  Vitals:   11/25/17 0653  TempSrc: Oral  PainSc:          Complications: No apparent anesthesia complications

## 2017-11-25 NOTE — Anesthesia Preprocedure Evaluation (Addendum)
Anesthesia Evaluation    Reviewed: Allergy & Precautions, NPO status , Patient's Chart, lab work & pertinent test results  History of Anesthesia Complications Negative for: history of anesthetic complications  Airway Mallampati: III  TM Distance: >3 FB Neck ROM: Full    Dental no notable dental hx. 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), No Residual Symptoms negative psych ROS   GI/Hepatic negative GI ROS, (+)     substance abuse  alcohol use,   Endo/Other  Morbid obesity  Renal/GU negative Renal ROS  negative genitourinary   Musculoskeletal negative musculoskeletal ROS (+)   Abdominal (+) + obese,   Peds  Hematology negative hematology ROS (+)   Anesthesia Other Findings   Reproductive/Obstetrics                            Anesthesia Physical Anesthesia Plan  ASA: III  Anesthesia Plan: General   Post-op Pain Management:    Induction: Intravenous  PONV Risk Score and Plan: 4 or greater and Ondansetron, Dexamethasone, Treatment may vary due to age or medical condition, Midazolam and Scopolamine patch - Pre-op  Airway Management Planned: LMA  Additional Equipment: None  Intra-op Plan:   Post-operative Plan: Extubation in OR  Informed Consent: I have reviewed the patients History and Physical, chart, labs and discussed the procedure including the risks, benefits and alternatives for the proposed anesthesia with the patient or authorized representative who has indicated his/her understanding and acceptance.     Plan Discussed with:   Anesthesia Plan Comments:        Anesthesia Quick Evaluation

## 2017-11-25 NOTE — Op Note (Signed)
OPERATIVE REPORT  DATE OF SURGERY: 11/25/2017  PATIENT:  Lauren Moore,  57 y.o. female  PRE-OPERATIVE DIAGNOSIS:  right tympanic membrane perforation  POST-OPERATIVE DIAGNOSIS: Same  PROCEDURE:  Procedure(s): TYMPANOPLASTY  SURGEON:  Susy Frizzle, MD  ASSISTANTS: None  ANESTHESIA:   General   EBL: 20 ml  DRAINS: None  LOCAL MEDICATIONS USED: 1% Xylocaine with epinephrine  SPECIMEN:  none  COUNTS:  Correct  PROCEDURE DETAILS: The patient was taken to the operating room and placed on the operating table in the supine position. Following induction of general endotracheal anesthesia, the right ear was prepped and draped in standard fashion.  The operating microscope was brought into the surgical field and used to inspect the ear canal and the tympanic membrane.  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  4 quadrants of the external auditory canal were infiltrated with local anesthetic, as well as the postauricular sulcus.  Radial incisions were created along the ear canal at 4:00 and 8:00 and a round knife was used to connect these about four 5 mm lateral to the annulus.  Vascular strip was elevated posteriorly.  The postauricular sulcus was incised with electrocautery.  Graft was harvested from the loose areolar tissue lateral to the temporalis fascia.  This was pressed and dried on the back table, later cut to size and not for the manubrium of the malleus.  The mastoid periosteum was incised and connected with a T incision to the linea temporalis.  The ear was brought forward off the mastoid bone and held in place with a Emergency planning/management officer.  The tympanic membrane was inspected.  A sharp cost pick was used to remove the edges of the perforation around from the manubrium all the way down to the postero-inferior annulus.  A tab knife was used to elevate the skin off of the annulus and a Pollyann Kennedy pick was used to clean epithelium off of the manubrium.  A round knife was then  used to elevate a tympanomeatal flap exposing the middle ear posteriorly.  The ossicular chain was all intact and there was no evidence of cholesteatoma matrix.  The eustachian tube recess was packed with saline soaked Gelfoam as well as the entire middle ear.  The graft was then placed into position in an underlay technique.  Extra Gelfoam was used in the middle ear for support of the graft to assure that the native epithelium was lying on top of the graft in all directions.  The graft was then supported laterally with Ciprodex soaked Gelfoam pieces.  The mastoid periosteum was reapproximated with interrupted 3-0 chromic sutures.  The incision was reapproximated with a running subcuticular 3-0 chromic closure.  Dermabond was used on the surface of the skin.  Additional packing was placed in the ear canal.  A cotton ball with bacitracin was placed at the meatus and a Glasscock dressing was applied.  Patient was awakened extubated and transferred to recovery in stable condition.    PATIENT DISPOSITION:  To PACU, stable

## 2017-11-25 NOTE — Discharge Instructions (Signed)
Avoid all strenuous activity for the next 3 weeks.  Do not do any heavy lifting or bending over.  Do not blow your nose for 4 weeks.  If you feel the need to sneeze, make sure your mouth is open.  Do not let any water get in the ear.  If you want to take a shower, it is okay to hold a dry washcloth on top of the ear to keep the water out.  It is okay of water hits the back of the ear where the glue is as that is waterproof.  On Tuesday morning, you may remove the dressing.  First remove the Velcro strap on the forehead.  The entire dressing then comes off easily.  You can remove the small adhesive pad on the forehead as well.  Take the cottonball out of the ear, put 3 drops into the ear and then replace with a fresh cottonball.  Repeat the drops and cotton 3 times daily.   Post Anesthesia Home Care Instructions  Activity: Get plenty of rest for the remainder of the day. A responsible individual must stay with you for 24 hours following the procedure.  For the next 24 hours, DO NOT: -Drive a car -Advertising copywriter -Drink alcoholic beverages -Take any medication unless instructed by your physician -Make any legal decisions or sign important papers.  Meals: Start with liquid foods such as gelatin or soup. Progress to regular foods as tolerated. Avoid greasy, spicy, heavy foods. If nausea and/or vomiting occur, drink only clear liquids until the nausea and/or vomiting subsides. Call your physician if vomiting continues.  Special Instructions/Symptoms: Your throat may feel dry or sore from the anesthesia or the breathing tube placed in your throat during surgery. If this causes discomfort, gargle with warm salt water. The discomfort should disappear within 24 hours.  If you had a scopolamine patch placed behind your ear for the management of post- operative nausea and/or vomiting:  1. The medication in the patch is effective for 72 hours, after which it should be removed.  Wrap patch in a tissue  and discard in the trash. Wash hands thoroughly with soap and water. 2. You may remove the patch earlier than 72 hours if you experience unpleasant side effects which may include dry mouth, dizziness or visual disturbances. 3. Avoid touching the patch. Wash your hands with soap and water after contact with the patch.

## 2017-11-26 ENCOUNTER — Encounter (HOSPITAL_BASED_OUTPATIENT_CLINIC_OR_DEPARTMENT_OTHER): Payer: Self-pay | Admitting: Otolaryngology

## 2017-12-13 NOTE — Telephone Encounter (Signed)
Left message for patients sister Corrie DandyMary to call back and discuss Dr.Cirigliano's recommendations. Also need to ask if patients family or personal medical hx has changed.

## 2017-12-13 NOTE — Telephone Encounter (Addendum)
Spoke to patient who states she spoke with someone already about this and was told she was due in 2024. Nothing recorded in Epic and no recall put in from whoever she spoke with. Recall now put into the system with Dr.C for 2024.

## 2017-12-30 ENCOUNTER — Encounter (HOSPITAL_BASED_OUTPATIENT_CLINIC_OR_DEPARTMENT_OTHER): Payer: Medicare Other

## 2018-01-01 ENCOUNTER — Ambulatory Visit (HOSPITAL_BASED_OUTPATIENT_CLINIC_OR_DEPARTMENT_OTHER): Payer: Medicare Other | Attending: Family Medicine | Admitting: Internal Medicine

## 2018-01-01 ENCOUNTER — Ambulatory Visit
Admit: 2018-01-01 | Discharge: 2018-01-01 | Disposition: A | Discharge status: Home / Self Care | Payer: Medicare Other | Attending: Family Medicine | Admitting: Family Medicine

## 2018-01-01 VITALS — Ht 66.0 in | Wt 250.0 lb

## 2018-01-01 DIAGNOSIS — Z1231 Encounter for screening mammogram for malignant neoplasm of breast: Secondary | ICD-10-CM

## 2018-01-01 DIAGNOSIS — G4733 Obstructive sleep apnea (adult) (pediatric): Secondary | ICD-10-CM | POA: Diagnosis not present

## 2018-01-01 DIAGNOSIS — I493 Ventricular premature depolarization: Secondary | ICD-10-CM | POA: Diagnosis not present

## 2018-01-01 DIAGNOSIS — G4720 Circadian rhythm sleep disorder, unspecified type: Secondary | ICD-10-CM

## 2018-01-12 DIAGNOSIS — G4733 Obstructive sleep apnea (adult) (pediatric): Secondary | ICD-10-CM

## 2018-01-12 DIAGNOSIS — G4720 Circadian rhythm sleep disorder, unspecified type: Secondary | ICD-10-CM

## 2018-01-12 NOTE — Procedures (Signed)
Patient Name: Lauren Moore, Lauren Moore Date: 01/01/2018 Gender: Female D.O.B: [birth year] Age (years): 57 Referring Provider: Madison Hickman Height (inches): 47 Interpreting Physician: Baird Lyons MD, ABSM Weight (lbs): 250 RPSGT: Carolin Coy BMI: 42 MRN: [id number] Neck Size: 16.00  CLINICAL INFORMATION Sleep Study Type: Split Night CPAP Indication for sleep study: Fatigue, Obesity, Snoring Epworth Sleepiness Score: 4  SLEEP STUDY TECHNIQUE As per the AASM Manual for the Scoring of Sleep and Associated Events v2.3 (April 2016) with a hypopnea requiring 4% desaturations.  The channels recorded and monitored were frontal, central and occipital EEG, electrooculogram (EOG), submentalis EMG (chin), nasal and oral airflow, thoracic and abdominal wall motion, anterior tibialis EMG, snore microphone, electrocardiogram, and pulse oximetry. Continuous positive airway pressure (CPAP) was initiated when the patient met split night criteria and was titrated according to treat sleep-disordered breathing.  MEDICATIONS Medications self-administered by patient taken the night of the study : none reported  RESPIRATORY PARAMETERS Diagnostic  Total AHI (/hr): 32.6 RDI (/hr): 45.9 OA Index (/hr): 0.9 CA Index (/hr): 0.0 REM AHI (/hr): 112.7 NREM AHI (/hr): 21.0 Supine AHI (/hr): 0.0 Non-supine AHI (/hr): 32.7 Min O2 Sat (%): 53.0 Mean O2 (%): 85.2 Time below 88% (min): 81.9   Titration  Optimal Pressure (cm): 17 AHI at Optimal Pressure (/hr): 0.0 Min O2 at Optimal Pressure (%): 92.0 Supine % at Optimal (%): 100 Sleep % at Optimal (%): 67   SLEEP ARCHITECTURE The recording time for the entire night was 381.5 minutes.  During a baseline period of 211.7 minutes, the patient slept for 130.6 minutes in REM and nonREM, yielding a sleep efficiency of 61.7%%. Sleep onset after lights out was 36.5 minutes with a REM latency of 141.5 minutes. The patient spent 29.1%% of the night in stage N1 sleep,  58.3%% in stage N2 sleep, 0.0%% in stage N3 and 12.6% in REM.  During the titration period of 164.6 minutes, the patient slept for 141.5 minutes in REM and nonREM, yielding a sleep efficiency of 85.9%%. Sleep onset after CPAP initiation was 6.6 minutes with a REM latency of 57.5 minutes. The patient spent 12.7%% of the night in stage N1 sleep, 67.5%% in stage N2 sleep, 0.0%% in stage N3 and 19.8% in REM.  CARDIAC DATA The 2 lead EKG demonstrated sinus rhythm. The mean heart rate was 100.0 beats per minute. Other EKG findings include: PVCs.  LEG MOVEMENT DATA The total Periodic Limb Movements of Sleep (PLMS) were 0. The PLMS index was 0.0 .  IMPRESSIONS - Severe obstructive sleep apnea occurred during the diagnostic portion of the study (AHI = 32.6/hour).  - An optimal PAP pressure was selected for this patient ( 17 cm of water) - No significant central sleep apnea occurred during the diagnostic portion of the study (CAI = 0.0/hour). - Oxygen desaturation was noted during the diagnostic portion of the study (Min O2 = 53.0%). - Minimum O2 sat on CPAP 17 was 92%. - The patient snored with moderate snoring volume during the diagnostic portion of the study. - EKG findings include PVCs. - Clinically significant periodic limb movements did not occur during sleep.  DIAGNOSIS - Obstructive Sleep Apnea (327.23 [G47.33 ICD-10])  RECOMMENDATIONS - Trial of CPAP therapy on 17 cm H2O, or DME autopap 10-20. - Patient used a Small size Fisher&Paykel Full Face Mask Simplus mask and heated humidification. - Be careful with alcohol, sedatives and other CNS depressants that may worsen sleep apnea and disrupt normal sleep architecture. - Sleep hygiene should be reviewed to  assess factors that may improve sleep quality. - Weight management and regular exercise should be initiated or continued.  [Electronically signed] 01/12/2018 11:17 AM  Baird Lyons MD, ABSM Diplomate, American Board of Sleep  Medicine   NPI: [id number]                          [place] [place] [facility]  ELECTRONICALLY SIGNED ON:  01/12/2018, 11:07 AM [place] PH: [phone]   FX: [phone] [place]

## 2018-11-20 ENCOUNTER — Other Ambulatory Visit: Payer: Self-pay

## 2018-11-20 DIAGNOSIS — Z20822 Contact with and (suspected) exposure to covid-19: Secondary | ICD-10-CM

## 2018-11-22 LAB — NOVEL CORONAVIRUS, NAA: SARS-CoV-2, NAA: NOT DETECTED

## 2020-08-23 ENCOUNTER — Emergency Department (HOSPITAL_BASED_OUTPATIENT_CLINIC_OR_DEPARTMENT_OTHER): Payer: 59

## 2020-08-23 ENCOUNTER — Encounter (HOSPITAL_BASED_OUTPATIENT_CLINIC_OR_DEPARTMENT_OTHER): Payer: Self-pay | Admitting: Emergency Medicine

## 2020-08-23 ENCOUNTER — Other Ambulatory Visit: Payer: Self-pay

## 2020-08-23 ENCOUNTER — Emergency Department (HOSPITAL_BASED_OUTPATIENT_CLINIC_OR_DEPARTMENT_OTHER)
Admission: EM | Admit: 2020-08-23 | Discharge: 2020-08-23 | Disposition: A | Discharge status: Home / Self Care | Payer: 59 | Attending: Emergency Medicine | Admitting: Emergency Medicine

## 2020-08-23 DIAGNOSIS — M25551 Pain in right hip: Secondary | ICD-10-CM | POA: Insufficient documentation

## 2020-08-23 DIAGNOSIS — M25559 Pain in unspecified hip: Secondary | ICD-10-CM

## 2020-08-23 DIAGNOSIS — Z87891 Personal history of nicotine dependence: Secondary | ICD-10-CM | POA: Insufficient documentation

## 2020-08-23 DIAGNOSIS — I10 Essential (primary) hypertension: Secondary | ICD-10-CM | POA: Diagnosis not present

## 2020-08-23 DIAGNOSIS — R600 Localized edema: Secondary | ICD-10-CM | POA: Diagnosis not present

## 2020-08-23 NOTE — Discharge Instructions (Addendum)
You were seen today for chronic hip pain.  Your x-rays are negative.  I have placed a consult for home health evaluation for physical therapy services and social work.  You should review receive a call in the next several days.  If not follow-up with hospital social work team.

## 2020-08-23 NOTE — ED Provider Notes (Signed)
[facility] EMERGENCY DEPARTMENT Provider Note   CSN: [id number] Arrival date & time: 08/23/20  0056     History Chief Complaint  Patient presents with   Hip Pain    Lauren Moore is a 60 y.o. female.  HPI     Is a 60 year old female with a history of obesity, hypertension, prior brain injury who presents with hip pain.  Patient presents with her daughter.  She reports chronic hip pain and frequent falls.  She recently moved in with her daughter.  Daughter has concerns that she is falling frequently and may need additional help and supportive devices at home.  They are having difficulty getting a primary physician to help with these needs as the patient has Medicaid.  Daughter reports that she believes that she needs a walker.  No recent falls specifically that brought him to the emergency department.  She does complain of chronic right hip pain which seems to exacerbate the frequency of her falls.  No recent fevers or other systemic symptoms.  Patient is currently without new complaint.  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   Polyuria 10/30/2017   Weight gain 10/30/2017   Essential hypertension 10/30/2017   Other abnormal glucose 10/30/2017   Breast cancer screening by mammogram 10/30/2017   Rectal bleed 06/24/2012   Hypokalemia 06/24/2012   Thrombocytopenia (HCC) 06/24/2012   Alcohol abuse 06/24/2012   Elevated liver enzymes 06/24/2012    Past Surgical History:  Procedure Laterality Date   BRAIN SURGERY     COLONOSCOPY N/A 06/26/2012   Procedure:  COLONOSCOPY;  Surgeon: Barrie Folk, MD;  Location: [facility];  Service: Endoscopy;  Laterality: N/A;   TYMPANOPLASTY Right 11/25/2017   Procedure: TYMPANOPLASTY;  Surgeon: Serena Colonel, MD;  Location: [facility];  Service: ENT;  Laterality: Right;     OB History   No obstetric history on file.     History reviewed. No pertinent family history.  Social History   Tobacco Use   Smoking status: Former   Smokeless tobacco: Never  Substance Use Topics   Alcohol use: Yes    Comment: daily beer and liquor/3 beers and 5 shots of liquor   Drug use: Not Currently    Types: Marijuana    Comment: states not smoked in one year    Home Medications Prior to Admission medications   Medication Sig Start Date End Date Taking? Authorizing Provider  ciprofloxacin-dexamethasone (CIPRODEX) OTIC suspension Place 3 drops into the right ear 3 (three) times daily. 11/25/17   Serena Colonel, MD  HYDROcodone-acetaminophen (NORCO) 7.5-325 MG tablet Take 1 tablet by mouth every 6 (six) hours as needed for moderate pain. 11/25/17   Serena Colonel, MD  Multiple Vitamin (MULTIVITAMIN WITH MINERALS) TABS Take 1 tablet by mouth daily. 06/27/12   Regalado, Belkys A, MD  promethazine (PHENERGAN) 25 MG suppository Place 1 suppository (25 mg total) rectally every 6 (six) hours as needed for nausea or vomiting. 11/25/17   Serena Colonel, MD    Allergies    Patient has  no known allergies.  Review of Systems   Review of Systems  Constitutional:  Negative for fever.  Respiratory:  Negative for shortness of breath.   Cardiovascular:  Negative for chest pain.  Gastrointestinal:  Negative for abdominal pain.  Musculoskeletal:        Hip pain  All other systems reviewed and are negative.  Physical Exam Updated Vital Signs BP (!) 155/89   Pulse 89   Resp 18   Ht 1.676 m (5\' 6" )   Wt 113 kg   SpO2 98%   BMI 40.21 kg/m   Physical Exam Vitals and nursing note reviewed.  Constitutional:       Appearance: She is well-developed. She is obese. She is not ill-appearing.  HENT:     Head: Normocephalic and atraumatic.     Nose: Nose normal.     Mouth/Throat:     Mouth: Mucous membranes are moist.  Eyes:     Pupils: Pupils are equal, round, and reactive to light.  Cardiovascular:     Rate and Rhythm: Normal rate and regular rhythm.     Heart sounds: Normal heart sounds.  Pulmonary:     Effort: Pulmonary effort is normal. No respiratory distress.     Breath sounds: No wheezing.  Abdominal:     General: Bowel sounds are normal.     Palpations: Abdomen is soft.     Tenderness: There is no abdominal tenderness.  Musculoskeletal:        General: Normal range of motion.     Cervical back: Neck supple.     Right lower leg: Edema present.     Left lower leg: Edema present.     Comments: Normal range of motion of the right hip, no obvious deformities or foreshortening, neurovascular  Skin:    General: Skin is warm and dry.  Neurological:     Mental Status: She is alert and oriented to person, place, and time.     Comments: Some cognitive slowing noted however answers questions appropriately and contributes to history taking  Psychiatric:        Mood and Affect: Mood normal.    ED Results / Procedures / Treatments   Labs (all labs ordered are listed, but only abnormal results are displayed) Labs Reviewed - No data to display  EKG None  Radiology DG Hip Unilat W or Wo Pelvis 2-3 Views Right  Result Date: 08/23/2020 CLINICAL DATA:  Right hip pain, status post fall 1 month ago. EXAM: DG HIP (WITH OR WITHOUT PELVIS) 2-3V RIGHT COMPARISON:  None. FINDINGS: There is no evidence of hip fracture or dislocation. Mild degenerative changes are seen in the form of joint space narrowing and acetabular sclerosis. IMPRESSION: No acute osseous abnormality. Electronically Signed   By: 08/25/2020 M.D.   On: 08/23/2020 03:17    Procedures Procedures   Medications Ordered in  ED Medications - No data to display  ED Course  I have reviewed the triage vital signs and the nursing notes.  Pertinent labs & imaging results that were available during my care of the patient were reviewed by me and considered in my medical decision making (see chart for details).    MDM Rules/Calculators/A&P                           Patient presents with right hip pain which is chronic in nature.  Mainly her daughter states that she feels that she does not  believe she is safe without additional assistive devices at home.  She has not been able to establish primary care because of lack of secondary insurance.  I did x-ray the right hip which shows no acute fracture.  Discussed with the daughter that I would refer her for face-to-face home health encounter for PT, nursing, and social work resources.  Daughter is agreeable to plan.  After history, exam, and medical workup I feel the patient has been appropriately medically screened and is safe for discharge home. Pertinent diagnoses were discussed with the patient. Patient was given return precautions.  Final Clinical Impression(s) / ED Diagnoses Final diagnoses:  Hip pain    Rx / DC Orders ED Discharge Orders          Ordered    Home Health        08/23/20 0327    Face-to-face encounter (required for Medicare/Medicaid patients)       Comments: I Lauren Moore Fabiha Rougeau certify that this patient is under my care and that I, or a nurse practitioner or physician's assistant working with me, had a face-to-face encounter that meets the physician face-to-face encounter requirements with this patient on 08/23/2020. The encounter with the patient was in whole, or in part for the following medical condition(s) which is the primary reason for home health care (List medical condition): hip pain, prior brain injury   08/23/20 0327             Yovani Cogburn, Lauren Masker, MD 08/23/20 0330

## 2020-08-23 NOTE — ED Triage Notes (Signed)
Pt c/o right hip pain which is ongoing but not improving.

## 2020-09-26 ENCOUNTER — Ambulatory Visit (INDEPENDENT_AMBULATORY_CARE_PROVIDER_SITE_OTHER): Payer: 59

## 2020-09-26 ENCOUNTER — Ambulatory Visit (INDEPENDENT_AMBULATORY_CARE_PROVIDER_SITE_OTHER): Payer: 59 | Admitting: Nurse Practitioner

## 2020-09-26 ENCOUNTER — Other Ambulatory Visit: Payer: Self-pay | Admitting: Nurse Practitioner

## 2020-09-26 ENCOUNTER — Encounter: Payer: Self-pay | Admitting: Nurse Practitioner

## 2020-09-26 ENCOUNTER — Other Ambulatory Visit: Payer: Self-pay

## 2020-09-26 VITALS — BP 120/72 | HR 100 | Temp 96.9°F | Ht 62.25 in | Wt 291.8 lb

## 2020-09-26 DIAGNOSIS — R0600 Dyspnea, unspecified: Secondary | ICD-10-CM

## 2020-09-26 DIAGNOSIS — F1010 Alcohol abuse, uncomplicated: Secondary | ICD-10-CM | POA: Diagnosis not present

## 2020-09-26 DIAGNOSIS — E559 Vitamin D deficiency, unspecified: Secondary | ICD-10-CM

## 2020-09-26 DIAGNOSIS — J189 Pneumonia, unspecified organism: Secondary | ICD-10-CM | POA: Diagnosis not present

## 2020-09-26 DIAGNOSIS — E538 Deficiency of other specified B group vitamins: Secondary | ICD-10-CM

## 2020-09-26 DIAGNOSIS — R531 Weakness: Secondary | ICD-10-CM

## 2020-09-26 DIAGNOSIS — R2681 Unsteadiness on feet: Secondary | ICD-10-CM | POA: Diagnosis not present

## 2020-09-26 LAB — CBC WITH DIFFERENTIAL/PLATELET
Basophils Absolute: 0.1 10*3/uL (ref 0.0–0.1)
Basophils Relative: 0.9 % (ref 0.0–3.0)
Eosinophils Absolute: 0.1 10*3/uL (ref 0.0–0.7)
Eosinophils Relative: 1.3 % (ref 0.0–5.0)
HCT: 41 % (ref 36.0–46.0)
Hemoglobin: 13.4 g/dL (ref 12.0–15.0)
Lymphocytes Relative: 25.5 % (ref 12.0–46.0)
Lymphs Abs: 2.9 10*3/uL (ref 0.7–4.0)
MCHC: 32.7 g/dL (ref 30.0–36.0)
MCV: 86 fl (ref 78.0–100.0)
Monocytes Absolute: 1 10*3/uL (ref 0.1–1.0)
Monocytes Relative: 8.5 % (ref 3.0–12.0)
Neutro Abs: 7.2 10*3/uL (ref 1.4–7.7)
Neutrophils Relative %: 63.8 % (ref 43.0–77.0)
Platelets: 238 10*3/uL (ref 150.0–400.0)
RBC: 4.76 Mil/uL (ref 3.87–5.11)
RDW: 12.8 % (ref 11.5–15.5)
WBC: 11.3 10*3/uL — ABNORMAL HIGH (ref 4.0–10.5)

## 2020-09-26 LAB — COMPREHENSIVE METABOLIC PANEL
ALT: 8 U/L (ref 0–35)
AST: 12 U/L (ref 0–37)
Albumin: 3.9 g/dL (ref 3.5–5.2)
Alkaline Phosphatase: 52 U/L (ref 39–117)
BUN: 14 mg/dL (ref 6–23)
CO2: 27 mEq/L (ref 19–32)
Calcium: 9.4 mg/dL (ref 8.4–10.5)
Chloride: 101 mEq/L (ref 96–112)
Creatinine, Ser: 0.67 mg/dL (ref 0.40–1.20)
GFR: 96.09 mL/min (ref >60.00)
Glucose, Bld: 79 mg/dL (ref 70–99)
Potassium: 4 mEq/L (ref 3.5–5.1)
Sodium: 138 mEq/L (ref 135–145)
Total Bilirubin: 1.1 mg/dL (ref 0.2–1.2)
Total Protein: 7.7 g/dL (ref 6.0–8.3)

## 2020-09-26 LAB — HEMOGLOBIN A1C: Hgb A1c MFr Bld: 5.1 % (ref 4.6–6.5)

## 2020-09-26 LAB — VITAMIN B12: Vitamin B-12: 178 pg/mL — ABNORMAL LOW (ref 211–911)

## 2020-09-26 LAB — TSH: TSH: 1.92 u[IU]/mL (ref 0.35–5.50)

## 2020-09-26 LAB — VITAMIN D 25 HYDROXY (VIT D DEFICIENCY, FRACTURES): VITD: 10.38 ng/mL — ABNORMAL LOW (ref 30.00–100.00)

## 2020-09-26 MED ORDER — ALBUTEROL SULFATE HFA 108 (90 BASE) MCG/ACT IN AERS: 1.0000 | INHALATION_SPRAY | Freq: Four times a day (QID) | RESPIRATORY_TRACT | 0 refills | Status: AC | PRN

## 2020-09-26 MED ORDER — LEVOFLOXACIN 750 MG PO TABS: 750.0000 mg | ORAL_TABLET | Freq: Every day | ORAL | 0 refills | Status: AC

## 2020-09-26 NOTE — Patient Instructions (Signed)
Go to lab for blood draw, CXR and urine collection  Have FL-2 form faxed to me.  It is important to get living will and health care power of attorney form signed.  Alcohol Use Disorder Alcohol use disorder is a condition in which drinking disrupts daily life. People with this condition drink too much alcohol and cannot control theirdrinking. Alcohol use disorder can cause serious problems with physical health. It can affect the brain, heart, and other internal organs. This disorder can raise the risk for certain cancers and cause problems with mental health, such asdepression or anxiety. What are the causes? This condition is caused by drinking too much alcohol over time. Some people with this condition drink to cope with or escape from negative life events.Others drink to relieve pain or symptoms of mental illness. What increases the risk? You are more likely to develop this condition if: You have a family history of alcohol use disorder. Your culture encourages drinking to the point of becoming drunk (intoxication). You had a mood or conduct disorder in childhood. You have been abused. You are an adolescent and you: Have poor performance in school. Have poor supervision or guidance. Act on impulse and like taking risks. What are the signs or symptoms? Symptoms of this condition include: Drinking more than you want to. Trying several times without success to drink less. Spending a lot of time thinking about alcohol, getting alcohol, drinking, or recovering from drinking. Continuing to drink even when it is causing serious problems in your daily life. Drinking when it is dangerous to drink, such as before driving a car. Needing more and more alcohol to get the same effect you want (building up tolerance). Having symptoms of withdrawal when you stop drinking. Withdrawal symptoms may include: Trouble sleeping, leading to tiredness (fatigue). Mood swings of depression and  anxiety. Physical symptoms, such as a fast heart rate, rapid breathing, high blood pressure (hypertension), fever, cold sweats, or nausea. Seizures. Severe confusion. Feeling or seeing things that are not there (hallucinations). Shaking movements that you cannot control (tremors). How is this diagnosed? This condition is diagnosed with an assessment. Your health care provider may start by asking three or four questions about your drinking, or he or she maygive you a simple test to take. This helps to get clear information from you. You may also have a physical exam or lab tests. You may be referred to asubstance abuse counselor. How is this treated? With education, some people with alcohol use disorder are able to reduce their drinking. Many with this disorder cannot change their drinking behavior on their own and need help from substance use specialists. These specialists are counselors who can help diagnose how severe your disorder is and what type of treatment you need. Treatments may include: Detoxification. Detoxification involves quitting drinking with supervision and direction of health care providers. Your health care provider may prescribe prescription medicines within the first week to help lessen withdrawal symptoms. Alcohol withdrawal can be dangerous and life-threatening. Detoxification may be provided in a home, community, or primary care setting, or in a hospital or substance use treatment facility. Counseling. This may involve motivational interviewing (MI), family therapy, or cognitive behavioral therapy (CBT). It is provided by substance use treatment counselors or professional therapists. A counselor can address the things you can do to change your drinking behavior and how to maintain the changes. Talk therapy aims to: Identify your positive motivations to change. Identify and avoid the things that trigger your drinking. Help you  learn how to plan your behavior change. Develop  support systems that can help you sustain the change. Medicines. Medicines can help treat this disorder by: Decreasing cravings. Decreasing the positive feeling you have when you drink. Causing an uncomfortable physical reaction when you drink (aversion therapy). Mutual help groups such as Alcoholics Anonymous (AA). These groups are led by people who have quit drinking. The groups provide emotional support, advice, and guidance. Some people with this condition benefit from a combination of treatmentsprovided by specialized substance use treatment centers. Follow these instructions at home:  Medicines Take over-the-counter and prescription medicines only as told by your health care provider. Ask before starting any new medicines, herbs, or supplements. General instructions Ask friends and family members to support your choice to stay sober. Avoid situations where alcohol is served. Create a plan to deal with tempting situations. Attend support groups regularly. Practice hobbies or activities you enjoy. Do not drink and drive. Keep all follow-up visits as told by your health care provider. This is important. How is this prevented? If you drink alcohol: Limit how much you use to: 0-1 drink a day for nonpregnant women. 0-2 drinks a day for men. Be aware of how much alcohol is in your drink. In the [place] one drink equals one 12 oz bottle of beer (355 mL), one 5 oz glass of wine (148 mL), or one 1 oz glass of hard liquor (44 mL). If you have a mental health condition, seek treatment. Develop a healthy lifestyle through: Meditation or deep breathing. Exercise. Spending time in nature. Listening to music. Talking with a trusted friend or family member. If you are an adolescent: Do not drink alcohol. Avoid gatherings where you might be tempted. Do not be afraid to say no if someone offers you alcohol. Speak up about why you do not want to drink. Set a positive example for others around you  by not drinking. Build relationships with friends who do not drink. Where to find more information Substance Abuse and Mental Health Services Administration: RockToxic.pl Alcoholics Anonymous: CustomizedRugs.fi Contact a health care provider if: You cannot take your medicines as told. Your symptoms get worse or you experience symptoms of withdrawal when you stop drinking. You start drinking again (relapse) and your symptoms get worse. Get help right away if: You have thoughts about hurting yourself or others. If you ever feel like you may hurt yourself or others, or have thoughts about taking your own life, get help right away. Go to your nearest emergency department or: Call your local emergency services (911 in the [place] Call a suicide crisis helpline, such as the National Suicide Prevention Lifeline at 952-781-2072. This is open 24 hours a day in the [place] Text the Crisis Text Line at (412)832-2930 (in the [place] Summary Alcohol use disorder is a condition in which drinking disrupts daily life. People with this condition drink too much alcohol and cannot control their drinking. Treatment may include detoxification, counseling, medicines, and support groups. Ask friends and family members to support you. Avoid situations where alcohol is served. Get help right away if you have thoughts about hurting yourself or others. This information is not intended to replace advice given to you by your health care provider. Make sure you discuss any questions you have with your healthcare provider. Document Revised: 12/04/2018 Document Reviewed: 12/04/2018 Elsevier Patient Education  2022 [employer].

## 2020-09-27 ENCOUNTER — Encounter: Payer: Self-pay | Admitting: Nurse Practitioner

## 2020-09-27 ENCOUNTER — Emergency Department (HOSPITAL_COMMUNITY): Payer: 59

## 2020-09-27 ENCOUNTER — Emergency Department (HOSPITAL_COMMUNITY)
Admission: EM | Admit: 2020-09-27 | Discharge: 2020-09-27 | Disposition: A | Discharge status: Home / Self Care | Payer: 59 | Attending: Emergency Medicine | Admitting: Emergency Medicine

## 2020-09-27 ENCOUNTER — Encounter (HOSPITAL_COMMUNITY): Payer: Self-pay | Admitting: Emergency Medicine

## 2020-09-27 ENCOUNTER — Encounter (HOSPITAL_BASED_OUTPATIENT_CLINIC_OR_DEPARTMENT_OTHER): Payer: Self-pay | Admitting: Emergency Medicine

## 2020-09-27 ENCOUNTER — Other Ambulatory Visit: Payer: Self-pay

## 2020-09-27 DIAGNOSIS — G8929 Other chronic pain: Secondary | ICD-10-CM | POA: Diagnosis not present

## 2020-09-27 DIAGNOSIS — W19XXXA Unspecified fall, initial encounter: Secondary | ICD-10-CM | POA: Diagnosis not present

## 2020-09-27 DIAGNOSIS — I10 Essential (primary) hypertension: Secondary | ICD-10-CM | POA: Diagnosis not present

## 2020-09-27 DIAGNOSIS — M25561 Pain in right knee: Secondary | ICD-10-CM | POA: Diagnosis present

## 2020-09-27 DIAGNOSIS — M25562 Pain in left knee: Secondary | ICD-10-CM | POA: Insufficient documentation

## 2020-09-27 LAB — URINALYSIS, ROUTINE W REFLEX MICROSCOPIC
Bilirubin Urine: NEGATIVE
Glucose, UA: NEGATIVE mg/dL
Hgb urine dipstick: NEGATIVE
Ketones, ur: NEGATIVE mg/dL
Nitrite: NEGATIVE
Protein, ur: NEGATIVE mg/dL
Specific Gravity, Urine: 1.011 (ref 1.005–1.030)
pH: 6 (ref 5.0–8.0)

## 2020-09-27 LAB — BASIC METABOLIC PANEL
Anion gap: 9 (ref 5–15)
BUN: 15 mg/dL (ref 6–20)
CO2: 28 mmol/L (ref 22–32)
Calcium: 9.1 mg/dL (ref 8.9–10.3)
Chloride: 99 mmol/L (ref 98–111)
Creatinine, Ser: 0.77 mg/dL (ref 0.44–1.00)
GFR, Estimated: >60 mL/min (ref >60)
Glucose, Bld: 106 mg/dL — ABNORMAL HIGH (ref 70–99)
Potassium: 4.5 mmol/L (ref 3.5–5.1)
Sodium: 136 mmol/L (ref 135–145)

## 2020-09-27 LAB — CBC WITH DIFFERENTIAL/PLATELET
Abs Immature Granulocytes: 0.04 10*3/uL (ref 0.00–0.07)
Basophils Absolute: 0.1 10*3/uL (ref 0.0–0.1)
Basophils Relative: 1 %
Eosinophils Absolute: 0.1 10*3/uL (ref 0.0–0.5)
Eosinophils Relative: 1 %
HCT: 42.4 % (ref 36.0–46.0)
Hemoglobin: 13.8 g/dL (ref 12.0–15.0)
Immature Granulocytes: 0 %
Lymphocytes Relative: 19 %
Lymphs Abs: 2.3 10*3/uL (ref 0.7–4.0)
MCH: 28.6 pg (ref 26.0–34.0)
MCHC: 32.5 g/dL (ref 30.0–36.0)
MCV: 87.8 fL (ref 80.0–100.0)
Monocytes Absolute: 1.1 10*3/uL — ABNORMAL HIGH (ref 0.1–1.0)
Monocytes Relative: 9 %
Neutro Abs: 8.8 10*3/uL — ABNORMAL HIGH (ref 1.7–7.7)
Neutrophils Relative %: 70 %
Platelets: 218 10*3/uL (ref 150–400)
RBC: 4.83 MIL/uL (ref 3.87–5.11)
RDW: 12.6 % (ref 11.5–15.5)
WBC: 12.4 10*3/uL — ABNORMAL HIGH (ref 4.0–10.5)
nRBC: 0 % (ref 0.0–0.2)

## 2020-09-27 LAB — CK: Total CK: 164 U/L (ref 38–234)

## 2020-09-27 LAB — PRO B NATRIURETIC PEPTIDE: NT-Pro BNP: 24 pg/mL (ref 0–287)

## 2020-09-27 MED ORDER — CYANOCOBALAMIN 1000 MCG/ML IJ SOLN: INTRAMUSCULAR | 0 refills | Status: DC

## 2020-09-27 MED ORDER — HYDROCODONE-ACETAMINOPHEN 5-325 MG PO TABS
1.0000 | ORAL_TABLET | Freq: Once | ORAL | Status: AC
  Administered 2020-09-27: 1 via ORAL
  Filled 2020-09-27: qty 1

## 2020-09-27 MED ORDER — VITAMIN D (ERGOCALCIFEROL) 1.25 MG (50000 UNIT) PO CAPS: 50000.0000 [IU] | ORAL_CAPSULE | ORAL | 0 refills | Status: DC

## 2020-09-27 MED ORDER — FOLIC ACID 1 MG PO TABS: 1.0000 mg | ORAL_TABLET | Freq: Every day | ORAL | 3 refills | Status: DC

## 2020-09-27 NOTE — ED Notes (Signed)
Pt transported to xray 

## 2020-09-27 NOTE — ED Notes (Signed)
Rolling walker ordered through [employer] to be delivered to the patient in room 25.

## 2020-09-27 NOTE — ED Provider Notes (Addendum)
[facility]-EMERGENCY DEPT Provider Note   CSN: [id number] Arrival date & time: 09/27/20  8921     History Chief Complaint  Patient presents with   Lauren Moore is a 60 y.o. female.  60 year old female with prior medical history as detailed below presents for evaluation.  Patient resides at home with her daughter.  Patient is reporting multiple frequent falls over the last several days.  Patient reports that her knees give her pain.  This pain makes it difficult for her to ambulate.  She denies any specific injury to her knees.  She reports that her pain is an ongoing issue.  Her most recent fall was this morning around 3 AM.  She reports that she was trying to get out of bed to use the bathroom.  As she was leaving her bed her knees would not bear her weight and she then slid to the floor.  She was unable to get herself off the floor.  EMS was called for lifting assistance.  EMS reports of the patient's daughter is requesting evaluation and possible rehab placement.  The patient reports that she does not use assist devices such as walker for ambulation.  Patient reports that she does not want to be placed into rehab.  She would prefer to go home.  The history is provided by the patient and the EMS personnel.  Fall This is a chronic problem. The current episode started 6 to 12 hours ago. The problem occurs every several days. The problem has not changed since onset.Pertinent negatives include no chest pain and no abdominal pain. Nothing aggravates the symptoms. Nothing relieves the symptoms.      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   Polyuria 10/30/2017   Weight gain 10/30/2017   Essential hypertension 10/30/2017   Other abnormal glucose 10/30/2017   Breast cancer screening by mammogram 10/30/2017   Conductive hearing loss of right ear 09/26/2017   Perforation of right tympanic membrane 09/26/2017   Rectal bleed 06/24/2012   Hypokalemia 06/24/2012   Thrombocytopenia (HCC) 06/24/2012   Alcohol abuse 06/24/2012   Elevated liver enzymes 06/24/2012    Past Surgical History:  Procedure Laterality Date   BRAIN SURGERY     COLONOSCOPY N/A 06/26/2012   Procedure: COLONOSCOPY;  Surgeon: Barrie Folk, MD;  Location: [facility];  Service: Endoscopy;  Laterality: N/A;   TYMPANOPLASTY Right 11/25/2017   Procedure: TYMPANOPLASTY;  Surgeon: Serena Colonel, MD;  Location: [facility];  Service: ENT;  Laterality: Right;     OB History   No obstetric history on file.     Family History  Problem Relation Age of Onset   Cancer Sister        Lung   Hypertension Sister    Diabetes Sister    Cancer Brother        Lung   Stroke Brother    Hypertension Brother     Social History   Tobacco Use   Smoking status: Never   Smokeless tobacco:  Never  Vaping Use   Vaping Use: Never used  Substance Use Topics   Alcohol use: Yes    Comment: daily beer and liquor/3 beers and 5 shots of liquor   Drug use: Never    Types: Marijuana    Comment: states not smoked in one year    Home Medications Prior to Admission medications   Medication Sig Start Date End Date Taking? Authorizing Provider  albuterol (VENTOLIN HFA) 108 (90 Base) MCG/ACT inhaler Inhale 1-2 puffs into the lungs every 6 (six) hours as needed for wheezing or shortness of breath. 09/26/20   Nche, Bonna Gains, NP  ibuprofen (ADVIL) 200 MG tablet Take 200 mg by mouth every 6 (six) hours as needed.    [provider]  levofloxacin (LEVAQUIN) 750 MG  tablet Take 1 tablet (750 mg total) by mouth daily for 5 days. 09/26/20 10/01/20  Nche, Bonna Gains, NP  Multiple Vitamin (MULTIVITAMIN WITH MINERALS) TABS Take 1 tablet by mouth daily. 06/27/12   Regalado, Belkys A, MD  promethazine (PHENERGAN) 25 MG suppository Place 1 suppository (25 mg total) rectally every 6 (six) hours as needed for nausea or vomiting. 11/25/17   Serena Colonel, MD    Allergies    Patient has no known allergies.  Review of Systems   Review of Systems  Cardiovascular:  Negative for chest pain.  Gastrointestinal:  Negative for abdominal pain.  All other systems reviewed and are negative.  Physical Exam Updated Vital Signs BP 134/78 (BP Location: Right Arm)   Pulse 87   Temp 98 F (36.7 C) (Oral)   Resp 20   SpO2 100%   Physical Exam Vitals and nursing note reviewed.  Constitutional:      General: She is not in acute distress.    Appearance: Normal appearance. She is well-developed.  HENT:     Head: Normocephalic and atraumatic.  Eyes:     Conjunctiva/sclera: Conjunctivae normal.     Pupils: Pupils are equal, round, and reactive to light.  Cardiovascular:     Rate and Rhythm: Normal rate and regular rhythm.     Heart sounds: Normal heart sounds.  Pulmonary:     Effort: Pulmonary effort is normal. No respiratory distress.     Breath sounds: Normal breath sounds.  Abdominal:     General: There is no distension.     Palpations: Abdomen is soft.     Tenderness: There is no abdominal tenderness.  Musculoskeletal:        General: No deformity. Normal range of motion.     Cervical back: Normal range of motion and neck supple.  Skin:    General: Skin is warm and dry.  Neurological:     General: No focal deficit present.     Mental Status: She is alert and oriented to person, place, and time. Mental status is at baseline.    ED Results / Procedures / Treatments   Labs (all labs ordered are listed, but only abnormal results are displayed) Labs Reviewed   BASIC METABOLIC PANEL  CBC WITH DIFFERENTIAL/PLATELET  URINALYSIS, ROUTINE W REFLEX MICROSCOPIC  CK    EKG None  Radiology DG Chest 2 View  Result Date: 09/26/2020 CLINICAL DATA:  Shortness of breath with exertion, initial encounter EXAM: CHEST - 2 VIEW COMPARISON:  None FINDINGS: Upper normal heart size with slight pulmonary vascular congestion. Mediastinal contours normal. RIGHT basilar infiltrate with question minimal LEFT basilar infiltrate as well. Respiratory motion artifacts degrade lateral view. Upper lungs clear. No  pleural effusion or pneumothorax. Biconvex thoracolumbar scoliosis. IMPRESSION: RIGHT basilar infiltrate and question minimal LEFT basilar infiltrate suspicious for pneumonia. Electronically Signed   By: Ulyses Southward M.D.   On: 09/26/2020 11:39    Procedures Procedures   Medications Ordered in ED Medications  HYDROcodone-acetaminophen (NORCO/VICODIN) 5-325 MG per tablet 1 tablet (1 tablet Oral Given 09/27/20 0941)    ED Course  I have reviewed the triage vital signs and the nursing notes.  Pertinent labs & imaging results that were available during my care of the patient were reviewed by me and considered in my medical decision making (see chart for details).    MDM Rules/Calculators/A&P                           MDM  MSE complete  Lauren Moore was evaluated in Emergency Department on 09/27/2020 for the symptoms described in the history of present illness. She was evaluated in the context of the global COVID-19 pandemic, which necessitated consideration that the patient might be at risk for infection with the SARS-CoV-2 virus that causes COVID-19. Institutional protocols and algorithms that pertain to the evaluation of patients at risk for COVID-19 are in a state of rapid change based on information released by regulatory bodies including the CDC and federal and state organizations. These policies and algorithms were followed during the patient's care  in the ED.  Patient is presenting for evaluation after reported fall.  Fall appears to have been secondary to chronic knee pain.  Patient's exam and work-up today does not demonstrate acute medical or traumatic pathology.  Patient is able to ambulate with minimal assistance in the ED.  Patient desires discharge back to her home.  Ambulatory referral made for home health assessment and likely PT.  Patient does understand need for close follow-up.  Strict return precautions given and understood.  Final Clinical Impression(s) / ED Diagnoses Final diagnoses:  Fall, initial encounter    Rx / DC Orders ED Discharge Orders     None        Wynetta Fines, MD 09/27/20 1249    Wynetta Fines, MD 09/27/20 1300

## 2020-09-27 NOTE — ED Triage Notes (Signed)
Pt BIB [facility] from home with her daughter with c/o fall. EMS reports pt fell around 3/4 am this morning and was still on the floor this morning when [facility] arrived. Daughter reports multiple falls past couple days. Pt unable to bear weight at all. 162/90 HR 92 RR 18 98% room air

## 2020-09-27 NOTE — Progress Notes (Signed)
Subjective:  Patient ID: Lauren Moore, female    DOB: [birth year]  Age: 60 y.o. MRN: [id number]  CC: Establish Care (SOB, weakness and frequent falls)  HPI Accompanied by daughter [name] lives with her daughter who is present during appt today. She has difficulty taking care of mother because she has to work. She does not think it is safe for [name] to be left alone, hence will like to discuss admission into a skilled nursing facility. Daughter declined home health services including PT.  She has contacted a Tour[employer] information centre manager to help find a facility. She has not had any pcp in several years.   ETOH abuse: 3-4beers and 2 glasses of liquor per day. [name] denies she drinks that much. Daughter states this has led to unsteady gait and multiple falls. She has no cane or walker at home  Memory Deficit: She has difficulty with short term memory Secondary to head injury several years.  MMSE - Mini Mental State Exam 09/26/2020  Orientation to time 3  Orientation to Place 5  Registration 3  Attention/ Calculation 0  Recall 1  Language- name 2 objects 2  Language- repeat 1  Language- follow 3 step command 2  Language- read & follow direction 1  Write a sentence 0  Copy design 0  Total score 18    Depression screen [facility] 2/9 09/26/2020 11/14/2017 08/04/2014  Decreased Interest 3 0 0  Down, Depressed, Hopeless 3 0 0  PHQ - 2 Score 6 0 0  Altered sleeping 2 - -  Tired, decreased energy 3 - -  Change in appetite 3 - -  Feeling bad or failure about yourself  2 - -  Trouble concentrating 1 - -  Moving slowly or fidgety/restless 3 - -  Suicidal thoughts 0 - -  PHQ-9 Score 20 - -  Difficult doing work/chores Extremely dIfficult - -    GAD 7 : Generalized Anxiety Score 09/26/2020  Nervous, Anxious, on Edge 2  Control/stop worrying 3  Worry too much - different things 3  Trouble relaxing 1  Restless 2  Easily annoyed or irritable 3  Afraid - awful might happen 3   Total GAD 7 Score 17  Anxiety Difficulty Extremely difficult   Reviewed past Medical, Social and Family history today.  Outpatient Medications Prior to Visit  Medication Sig Dispense Refill   ibuprofen (ADVIL) 200 MG tablet Take 400 mg by mouth every 6 (six) hours as needed for mild pain.     No facility-administered medications prior to visit.    ROS See HPI  Objective:  BP 120/72 (BP Location: Left Arm, Patient Position: Sitting, Cuff Size: Large)   Pulse 100   Temp (!) 96.9 F (36.1 C) (Temporal)   Ht 5' 2.25" (1.581 m)   Wt 291 lb 12.8 oz (132.4 kg)   SpO2 96%   BMI 52.94 kg/m   Physical Exam Constitutional:      Appearance: She is obese.  Cardiovascular:     Rate and Rhythm: Normal rate and regular rhythm.     Pulses: Normal pulses.     Heart sounds: Normal heart sounds.  Pulmonary:     Effort: Pulmonary effort is normal.     Breath sounds: Normal breath sounds.  Musculoskeletal:     Right lower leg: No edema.     Left lower leg: No edema.  Neurological:     Mental Status: She is alert.     Motor: Weakness present.  Gait: Gait abnormal.     Comments: Oriented to person, place and family. She needs 2 person assist to get on and off exam table. Has ataxic gait  Psychiatric:        Attention and Perception: Attention normal.        Mood and Affect: Mood is anxious.        Speech: Speech normal.        Behavior: Behavior is cooperative.        Cognition and Memory: Memory is impaired.   Assessment & Plan:  This visit occurred during the SARS-CoV-2 public health emergency.  Safety protocols were in place, including screening questions prior to the visit, additional usage of staff PPE, and extensive cleaning of exam room while observing appropriate contact time as indicated for disinfecting solutions.   [name] was seen today for establish care.  Diagnoses and all orders for this visit:  Community acquired pneumonia of right lower lobe of lung -      Pro b natriuretic peptide -     DG Chest 2 View -     levofloxacin (LEVAQUIN) 750 MG tablet; Take 1 tablet (750 mg total) by mouth daily for 5 days. (Patient not taking: No sig reported) -     albuterol (VENTOLIN HFA) 108 (90 Base) MCG/ACT inhaler; Inhale 1-2 puffs into the lungs every 6 (six) hours as needed for wheezing or shortness of breath. (Patient not taking: No sig reported)  ETOH abuse -     Vitamin B12 -     levofloxacin (LEVAQUIN) 750 MG tablet; Take 1 tablet (750 mg total) by mouth daily for 5 days. (Patient not taking: No sig reported) -     folic acid (FOLVITE) 1 MG tablet; Take 1 tablet (1 mg total) by mouth daily.  Generalized weakness -     CBC with Differential/Platelet -     Comprehensive metabolic panel -     TSH -     Cancel: Urinalysis w microscopic + reflex cultur -     Urinalysis w microscopic + reflex cultur; Future -     For home use only DME 4 wheeled rolling walker with seat ([id number])  Unsteady gait when walking -     For home use only DME 4 wheeled rolling walker with seat ([id number])  Morbid obesity (HCC) -     TSH -     Hemoglobin A1c -     VITAMIN D 25 Hydroxy (Vit-D Deficiency, Fractures) -     levofloxacin (LEVAQUIN) 750 MG tablet; Take 1 tablet (750 mg total) by mouth daily for 5 days. (Patient not taking: No sig reported)  B12 deficiency -     cyanocobalamin (,VITAMIN B-12,) 1000 MCG/ML injection; Injection once weekly x 4weeks then monthly.  Vitamin D deficiency -     Vitamin D, Ergocalciferol, (DRISDOL) 1.25 MG (50000 UNIT) CAPS capsule; Take 1 capsule (50,000 Units total) by mouth every 7 (seven) days. Normal lab results except los B12 and vit. D: rx sent and needs to schedule nurse visit for B12 injections. CXR: RIGHT basilar infiltrate and question minimal LEFT basilar infiltrate suspicious for pneumonia.  Problem List Items Addressed This Visit   None Visit Diagnoses     Community acquired pneumonia of right lower lobe of lung    -   Primary   Relevant Medications   levofloxacin (LEVAQUIN) 750 MG tablet   albuterol (VENTOLIN HFA) 108 (90 Base) MCG/ACT inhaler   Other Relevant Orders   Pro b  natriuretic peptide (Completed)   DG Chest 2 View (Completed)   ETOH abuse       Relevant Medications   levofloxacin (LEVAQUIN) 750 MG tablet   folic acid (FOLVITE) 1 MG tablet   Other Relevant Orders   Vitamin B12 (Completed)   Generalized weakness       Relevant Orders   CBC with Differential/Platelet (Completed)   Comprehensive metabolic panel (Completed)   TSH (Completed)   Urinalysis w microscopic + reflex cultur   For home use only DME 4 wheeled rolling walker with seat ([id number])   Unsteady gait when walking       Relevant Orders   For home use only DME 4 wheeled rolling walker with seat ([id number])   Morbid obesity (HCC)       Relevant Medications   levofloxacin (LEVAQUIN) 750 MG tablet   Other Relevant Orders   TSH (Completed)   Hemoglobin A1c (Completed)   VITAMIN D 25 Hydroxy (Vit-D Deficiency, Fractures) (Completed)   B12 deficiency       Relevant Medications   cyanocobalamin (,VITAMIN B-12,) 1000 MCG/ML injection   Vitamin D deficiency       Relevant Medications   Vitamin D, Ergocalciferol, (DRISDOL) 1.25 MG (50000 UNIT) CAPS capsule       Follow-up: Return in about 4 weeks (around 10/24/2020) for ETOH abuse ( ).  Alysia Penna, NP

## 2020-09-27 NOTE — Progress Notes (Signed)
TOC CSW was not consulted on pt , however CSW received call from pt'd daughter who is requesting an FL2. Pt's daughter stated pt drinks alcohol and falls on the floor and its hard to pick pt up. CSW informed pt's daughter she will have to contact pt's PCP to have form completed. CSW also informed pt's daughter pt stated she did not want to go to a SNF, due to pt having capacity , CSW will not be able to send out information. CSW tried to inform pt daughter that pt can receive home health and DME but she was agitated and hung the phone up. CSW can be consulted if any pt has any SW needs.    Lauren Moore, MSW, LCSWA [facility]  Transitions of Care Clinical Social Worker I Direct Dial: [phone]  Fax: [phone] [email or web address]

## 2020-09-27 NOTE — ED Notes (Signed)
Spoke with daughter Alferd Patee. She said after talking the patient is willing to be voluntarily commited to a treatment program. Lebron Conners is the admissions contact.

## 2020-09-27 NOTE — Discharge Instructions (Addendum)
Return for any problem.  ?

## 2020-10-13 ENCOUNTER — Telehealth: Payer: Self-pay | Admitting: Nurse Practitioner

## 2020-10-13 NOTE — Telephone Encounter (Signed)
Avis(pt's daughter) dropped off paperwork for [place] to fill out. Pt's FL2 form. Avis would like a call when complete at [phone], she would like to come by and pick up.

## 2020-10-17 NOTE — Telephone Encounter (Signed)
Pt daughter came by the office to check on paperwork, it was not ready yet. I let her know someone will call her when it is ready for pickup. She was frustrated.

## 2020-10-21 ENCOUNTER — Telehealth: Payer: Self-pay | Admitting: Nurse Practitioner

## 2020-10-21 DIAGNOSIS — Z0279 Encounter for issue of other medical certificate: Secondary | ICD-10-CM

## 2020-10-26 NOTE — Telephone Encounter (Signed)
Duplicate note (see tel note 10/26/20)   Spoke with daughter, [name], and advised I have located form. She stated pt will be going to [facility]. We previously had noted [facility]. New form printed and given to Alysia Penna, NP. It has been completed & faxed.   Faxed to [facility] Fax # [phone] Provider # [phone]  Copy sent to scan.  LVM for Avis (ph# [phone]) that this was taken care of and copy in front office file to pick up when she is in the area.

## 2020-11-10 ENCOUNTER — Ambulatory Visit (INDEPENDENT_AMBULATORY_CARE_PROVIDER_SITE_OTHER): Payer: 59 | Admitting: Primary Care

## 2020-12-28 ENCOUNTER — Ambulatory Visit (HOSPITAL_COMMUNITY)
Admission: RE | Admit: 2020-12-28 | Discharge: 2020-12-28 | Disposition: A | Discharge status: Home / Self Care | Payer: 59 | Attending: Psychiatry | Admitting: Psychiatry

## 2020-12-28 NOTE — Progress Notes (Addendum)
CSW attempted to contact [facility] APS and left a message to return call.  Maryjean Ka, MSW, [facility] 12/28/2020 11:23 PM
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
## 2020-12-28 NOTE — H&P (Signed)
Behavioral Health Medical Screening Exam  Lauren Moore is a 60 y.o. female who presented to [facility] voluntarily as a walk-I. She was dropped off by her daughter for evaluation of drug and alcohol detox. Patient was seen, chart reviewed and case discussed with the treatment team and Dr Lucianne Muss. Patient stated she does like to drink beer and drinks every day, about 5 glasses. She denies current drug use and stated she used to smoke crack cocaine but hasn't in 5 years. Patient denies SI/HI/AVH, paranoia and does not appear to be responding to internal stimuli. Patient stated she does not think she needs to be in a hospital and her only complaint is hip pain. Patient does not meet criteria for inpatient hospitalization. Patient remained at [facility] until her sister came to pick her up. Patient left [facility] in no apparent distress.   Patient's daughter left the patient and her belongings at [facility]. When her daughter Lauren Moore [phone] was called for collateral and to let her know she was not appropriate for admission, the daughter refused to come and get her and hung up. She did say she is in the process of getting guardianship over her mother but that her mother has been staying with her sister recently.  Her sister Lauren Moore [phone] was called for additional collateral and she stated "her daughter is only using her for her money. She has kept her from the family for 4 years. She can come live here and I will come and get her." Patient's sister was encouraged to call DSS and speak with APS about her concerns that patient's daughter may be exploiting her.   Total Time spent with patient: 30 minutes  Psychiatric Specialty Exam:  Presentation  General Appearance: Appropriate for Environment Eye Contact:Good Speech:Clear and Coherent; Normal Rate Speech Volume:Normal Handedness:No data recorded  Mood and Affect  Mood:Euthymic Affect:Congruent  Thought Process  Thought  Processes:Coherent Descriptions of Associations:Intact Orientation:Full (Time, Place and Person) Thought Content:Logical History of Schizophrenia/Schizoaffective disorder:No data recorded Duration of Psychotic Symptoms:No data recorded Hallucinations:Hallucinations: None Ideas of Reference:None Suicidal Thoughts:Suicidal Thoughts: No Homicidal Thoughts:Homicidal Thoughts: No  Sensorium  Memory:Immediate Fair; Recent Fair; Remote Fair Judgment:Fair Insight:Fair  Executive Functions  Concentration:Fair Attention Span:Fair Recall:Fair Fund of Knowledge:Fair Language:Good  Psychomotor Activity  Psychomotor Activity:Psychomotor Activity: Normal  Assets  Assets:Communication Skills; Financial Resources/Insurance; Housing  Sleep  Sleep:Sleep: Good  Physical Exam: Physical Exam Vitals reviewed.  Constitutional:      Appearance: Normal appearance.  HENT:     Head: Normocephalic.  Pulmonary:     Effort: Pulmonary effort is normal.  Musculoskeletal:        General: Normal range of motion.     Cervical back: Normal range of motion.  Neurological:     General: No focal deficit present.     Mental Status: She is alert and oriented to person, place, and time.  Psychiatric:        Attention and Perception: Attention and perception normal.        Mood and Affect: Mood normal.        Speech: Speech normal.        Behavior: Behavior normal. Behavior is cooperative.        Thought Content: Thought content normal. Thought content is not paranoid or delusional. Thought content does not include homicidal or suicidal ideation. Thought content does not include homicidal or suicidal plan.   Review of Systems  Constitutional: Negative.  Negative for fever.  HENT: Negative.  Negative for congestion.  Respiratory:  Negative for cough.   Cardiovascular:  Negative for chest pain.  Neurological: Negative.   Blood pressure (!) 148/62, pulse 85, temperature 98.6 F (37 C), temperature  source Oral, resp. rate 20, SpO2 99 %. There is no height or weight on file to calculate BMI.  Musculoskeletal: Strength & Muscle Tone: within normal limits Gait & Station: normal Patient leans: N/A   Recommendations:  Based on my evaluation the patient does not appear to have an emergency medical condition.Patient was given outpatient resources for follow up in the community.   Laveda Abbe, NP 12/28/2020, 7:39 PM

## 2020-12-28 NOTE — Progress Notes (Addendum)
CSW met with pt and introduced self as Education officer, museum with [employer]. CSW provided education about the provided [facility] with the referral to SNF that was completed back in September that pt's daughter left. CSW observed pt ambulate without any DME and appeared to have no needed trouble walking. Pt shared that she did fall and still has hip pain. CSW explained that pt does not have a skilled need to meet criteria for SNF placement, however pt would be a good candidate for ALF placement. CSW provided resources for Assisted Living Facilities within [facility] [place] resources for Physical therapy,and information to apply for for Special Assistance Medicaid. CSW met with pt's sister, Shauna Hugh who advised that pt's daughter has a history of mental health and that she is after her mother's money and anything her mother has. Pt's sister reported that pt's daughter has kept pt away from the family for 4 years and that she ran into her sister in the community and that is how they were able to reconnect. Pt advised that the plan was for pt to receive some assist from behavioral health with her drinking although pt shares that she thinks her drinking is not a problem, however shared that she knows that she should not drink due to her health concerns. Pt reported that she overall wants to make her daughter happy and shared that is the reason she allowed her daughter to be her POA. CSW provided education about POA and informed that pt can still speak for herself unless she is deemed incompetent. Pt reported that her daughter is trying to pay the court cost to obtain guardianship, which her daughter plans to pay the fees tomorrow. Pt provided pt and pt's sister who APS's contact and information for Elder Law.    CSW called [place] at 9:05pm [phone]. CSW received a phone call back at 9:14pm from Education officer, museum Mrs. Currie Paris from an unknown from phone number. CSW completed the APS  report informing that pt's daughter refused to pick pt up after being informed that pt did not met criteria for inpatient behavioral health placement. CSW advised Social Worker Tim Lair that pt's daughter is her POA but pt informed that she only agreed to the POA process because she wanted to make her daughter happy. CSW explained that pt reports that her daughter is refusing to return her ID, debit card, social security information, and EBT card. CSW reported that pt will receive her social security income tomorrow and she is fearful that her daughter will spend all of her money. Pt reported that while she was living with her daughter, her daughter was nasty, rude, and did not like her to talk to her children which are pt's grandchildren. Pt shared that her daughter did not allow her to see her other family for about 4 years. CSW explained that CSW met with pt's sister and pt's sister agreed to allow pt to stay with her. CSW informed that pt's sister plans to make an APS report as well. Social Worker Tim Lair informed this CSW that pt's sister already called in to attempt to make a report but Education officer, museum was unable to contact her back when she attempted to follow-up. CSW explained that pt's sister shared that pt's daughter also took pt's phone and refused to give it back. Pt's sister reported that she had her sister remember who phone number so that she can always contact her if she needed something. CSW advised Social Worker that pt's sister  reported that she was going to contact law enforcement as well. CSW agreed to received follow-up information about this case via mail.    Benjaman Kindler, MSW, [facility] 12/28/2020 11:16 PM

## 2021-01-05 ENCOUNTER — Ambulatory Visit (HOSPITAL_COMMUNITY)
Admission: EM | Admit: 2021-01-05 | Discharge: 2021-01-05 | Disposition: A | Discharge status: Home / Self Care | Payer: 59 | Attending: Psychiatry | Admitting: Psychiatry

## 2021-01-05 DIAGNOSIS — F1010 Alcohol abuse, uncomplicated: Secondary | ICD-10-CM | POA: Diagnosis not present

## 2021-01-05 DIAGNOSIS — Z046 Encounter for general psychiatric examination, requested by authority: Secondary | ICD-10-CM | POA: Insufficient documentation

## 2021-01-05 NOTE — ED Provider Notes (Signed)
Behavioral Health Urgent Care Medical Screening Exam  Patient Name: Lauren Moore MRN: [id number] Date of Evaluation: 01/05/21 Chief Complaint:   Diagnosis:  Final diagnoses:  Involuntary commitment    History of Present illness: Lauren Moore is a 60 y.o. female. Patient presents involuntarily to [facility] behavioral health for assessment.  Patient is transported by Event organiser.  Patient petition for involuntary commitment by her daughter, [name].  Petition reads: "Respondent is hostile and aggressive.  Has a history of alcohol abuse.  Respondent has suffered from head trauma leading to memory difficulties.  Abusing alcohol and illegal drugs daily.  Has limited mobility.  Becomes aggressive and hostile with family members and proceeds to damage property.  Not tending to hygiene.  Respondent is not able to take care of herself.  Is a danger to herself.  Evanny states "I was just at home and I was surprised when the police came."  The patient appears appropriately and casually groomed.  She is ambulatory, visualized patient ambulation when walking from assessment room to police vehicle without assistance.  Monai endorses alcohol use.  She states "I had only 1 beer today, I drink every day.  I drink every day except when I do not have any money."  She denies drinking until she blacks out, she denies any history of alcohol-related seizure.  At this time she denies readiness to cease using alcohol.  Patient is assessed face-to-face by nurse practitioner.  She is seated in assessment area, no acute distress.  She is alert and oriented, pleasant and cooperative during assessment.   Assata denies any outpatient psychiatry follow-up.  She denies any history of outpatient psychiatry follow-up, denies current medications.  She presents with euthymic mood, congruent affect. She denies suicidal and homicidal ideations.  She denies history of suicide attempts, denies history  of self-harm.  She contracts verbally for safety with this Probation officer.  She has normal speech and behavior.  She denies both auditory and visual hallucinations.  Patient is able to converse coherently with goal-directed thoughts and no distractibility or preoccupation.  She denies paranoia.  Objectively there is no evidence of psychosis/mania or delusional thinking.  Delphine resides in [place] with her Agnes Lawrence, she denies access to weapons.  She is currently not employed however receives a monthly income.  Patient endorses average sleep and appetite.  She denies substance use aside from daily alcohol use.  Patient offered support and encouragement.  She gives verbal consent to speak with her sister, Stanton Kidney phone number [phone].  Attempted to speak with patient's sister, HIPAA compliant voicemail left.  Patient gives verbal consent to speak with her daughter, [name] phone number [phone].  Spoke with patient's daughter who is involuntary Therapist, art.  Patient's daughter states "she has a problem with crack and alcohol, I tried to stop her from going to the nursing home, she definitely has a cocaine problem and she drinks all day."  Patient's daughter fears for patient safety related to her reported alcohol and drug abuse.  Patient's daughter reports she last spoke with/had any contact with [facility] on 12/28/2020.  Patient's daughter would like for patient to be placed in a "nursing home." Spoke with patient's daughter at length regarding petition, patient's daughter did verbalize frustration and hung up the telephone prior to ending call.   Psychiatric Specialty Exam  Presentation  General Appearance:Appropriate for Environment; Casual  Eye Contact:Good  Speech:Clear and Coherent; Normal Rate  Speech Volume:Normal  Handedness:Right   Mood and Affect  Mood:Euthymic  Affect:Appropriate; Congruent   Thought Process  Thought Processes:Coherent; Goal Directed;  Linear  Descriptions of Associations:Intact  Orientation:Full (Time, Place and Person)  Thought Content:Logical; WDL  Diagnosis of Schizophrenia or Schizoaffective disorder in past: No   Hallucinations:None  Ideas of Reference:None  Suicidal Thoughts:No  Homicidal Thoughts:No   Sensorium  Memory:Immediate Good; Recent Fair; Remote Fair  Judgment:Fair  Insight:Fair   Executive Functions  Concentration:Good  Attention Span:Good  Recall:Good  Fund of Knowledge:Good  Language:Good   Psychomotor Activity  Psychomotor Activity:Normal   Assets  Assets:Communication Skills; Financial Resources/Insurance; Housing; Intimacy; Leisure Time; Social Support   Sleep  Sleep:Fair  Number of hours: No data recorded  No data recorded  Physical Exam: Physical Exam Vitals and nursing note reviewed.  Constitutional:      Appearance: Normal appearance. She is well-developed. She is obese.  HENT:     Head: Normocephalic and atraumatic.     Nose: Nose normal.  Cardiovascular:     Rate and Rhythm: Normal rate.  Pulmonary:     Effort: Pulmonary effort is normal.  Musculoskeletal:        General: Normal range of motion.     Cervical back: Normal range of motion.  Skin:    General: Skin is warm and dry.  Neurological:     Mental Status: She is alert and oriented to person, place, and time.  Psychiatric:        Attention and Perception: Attention and perception normal.        Mood and Affect: Mood and affect normal.        Speech: Speech normal.        Behavior: Behavior normal. Behavior is cooperative.        Thought Content: Thought content normal.        Cognition and Memory: Cognition and memory normal.        Judgment: Judgment normal.   Review of Systems  Constitutional: Negative.   HENT: Negative.    Eyes: Negative.   Respiratory: Negative.    Cardiovascular: Negative.   Gastrointestinal: Negative.   Genitourinary: Negative.   Musculoskeletal:  Negative.   Skin: Negative.   Neurological: Negative.   Endo/Heme/Allergies: Negative.   Psychiatric/Behavioral:  Positive for substance abuse.   Blood pressure 116/79, pulse 94, temperature 98.6 F (37 C), temperature source Oral, resp. rate 19, SpO2 100 %. There is no height or weight on file to calculate BMI.  Musculoskeletal: Strength & Muscle Tone: within normal limits Gait & Station: normal Patient leans:    [facility] MSE Discharge Disposition for Follow up and Recommendations: Based on my evaluation the patient does not appear to have an emergency medical condition and can be discharged with resources and follow up care in outpatient services for Medication Management and Individual Therapy Patient reviewed with Dr. Bronwen Betters. Follow-up with outpatient psychiatry, resources provided. Follow-up with substance use treatment resources provided.   Lenard Lance, FNP 01/05/2021, 6:29 PM

## 2021-01-05 NOTE — Discharge Instructions (Signed)

## 2021-01-05 NOTE — Progress Notes (Signed)
   01/05/21 1821  [facility] Triage Screening (Walk-ins at [facility] only)  How Did You Hear About [place]? Family/Friend  What Is the Reason for Your Visit/Call Today? Pt Lauren Moore was IVC'd and accompanied by [employer]. Patient states that she does not know why she is here, "cops just showed up." She denies suicidal ideation and has no plans. She denies homicidal ideation.Pt reports that she has experienced what she thinks might be a visual hallucination. She reports she saw a basket of flowers on her front porch and then came back outside and they were gone. No other reports of visual hallucinations. No other reports of auditory or other hallucinations. Pt reports that she does drink one 16 ounce beer a day but no other alcohol or substances. She reports she does not have a therapist or psychiatrist.  How Long Has This Been Causing You Problems? <Week  Have You Recently Had Any Thoughts About Hurting Yourself? No  Are You Planning to Commit Suicide/Harm Yourself At This time? No  Have you Recently Had Thoughts About Hurting Someone Karolee Ohs? No  Are You Planning To Harm Someone At This Time? No  Are you currently experiencing any auditory, visual or other hallucinations? No  Have You Used Any Alcohol or Drugs in the Past 24 Hours? No  Do you have any current medical co-morbidities that require immediate attention? No  Clinician description of patient physical appearance/behavior: Pt has a clean and calm appearance. Normal speech and thought process. Mood was somewhat anxious, affect congruent with mood. Pt reports hallucinations but was not responding to any stimuli. Insight was fair. Oriented x4.  What Do You Feel Would Help You the Most Today?  (Pt states "I don't know")  If access to [facility] was not available, would you have sought care in the Emergency Department? Yes  Determination of Need Emergent (2 hours)  Options For Referral Inpatient Hospitalization

## 2021-01-05 NOTE — ED Notes (Signed)
Pt discharged with  AVS.  AVS reviewed prior to discharge.  Pt alert, oriented, and ambulatory.  Safety maintained.  °

## 2021-01-14 ENCOUNTER — Telehealth (HOSPITAL_COMMUNITY): Payer: Self-pay | Admitting: Nurse Practitioner

## 2021-01-14 NOTE — BH Assessment (Signed)
Care Management [facility] Discharge Follow Up    Writer attempted to make contact with patient today and was unsuccessful.  Writer left a HIPPA compliant voice message.   Per chart review, patient was provided with outpatient resources.

## 2021-04-15 ENCOUNTER — Other Ambulatory Visit: Payer: Self-pay

## 2021-04-15 ENCOUNTER — Telehealth: Payer: Self-pay

## 2021-04-15 ENCOUNTER — Emergency Department (HOSPITAL_COMMUNITY): Payer: 59

## 2021-04-15 ENCOUNTER — Emergency Department (HOSPITAL_COMMUNITY)
Admission: EM | Admit: 2021-04-15 | Discharge: 2021-04-15 | Disposition: A | Discharge status: Home / Self Care | Payer: 59 | Attending: Emergency Medicine | Admitting: Emergency Medicine

## 2021-04-15 ENCOUNTER — Encounter (HOSPITAL_COMMUNITY): Payer: Self-pay

## 2021-04-15 DIAGNOSIS — W01198A Fall on same level from slipping, tripping and stumbling with subsequent striking against other object, initial encounter: Secondary | ICD-10-CM | POA: Insufficient documentation

## 2021-04-15 DIAGNOSIS — Y92000 Kitchen of unspecified non-institutional (private) residence as  the place of occurrence of the external cause: Secondary | ICD-10-CM | POA: Insufficient documentation

## 2021-04-15 DIAGNOSIS — M533 Sacrococcygeal disorders, not elsewhere classified: Secondary | ICD-10-CM | POA: Diagnosis present

## 2021-04-15 DIAGNOSIS — I10 Essential (primary) hypertension: Secondary | ICD-10-CM | POA: Diagnosis not present

## 2021-04-15 DIAGNOSIS — Y9301 Activity, walking, marching and hiking: Secondary | ICD-10-CM | POA: Insufficient documentation

## 2021-04-15 MED ORDER — OXYCODONE-ACETAMINOPHEN 5-325 MG PO TABS: 1.0000 | ORAL_TABLET | Freq: Four times a day (QID) | ORAL | 0 refills | Status: DC | PRN

## 2021-04-15 MED ORDER — OXYCODONE-ACETAMINOPHEN 5-325 MG PO TABS: 1.0000 | ORAL_TABLET | Freq: Four times a day (QID) | ORAL | 0 refills | Status: AC | PRN

## 2021-04-15 MED ORDER — OXYCODONE-ACETAMINOPHEN 5-325 MG PO TABS
1.0000 | ORAL_TABLET | Freq: Once | ORAL | Status: AC
  Administered 2021-04-15: 1 via ORAL
  Filled 2021-04-15: qty 1

## 2021-04-15 NOTE — ED Provider Notes (Signed)
?[facility] EMERGENCY DEPARTMENT ?Provider Note ? ? ?CSN: [id number] ?Arrival date & time: 04/15/21  1202 ? ?  ? ?History ?PMH: obesity, HTN, tendonitis of the left hip, hx of alcohol abuse ?Chief Complaint  ?Patient presents with  ? Fall  ? Tailbone Pain  ? ? ?Lauren Moore is a 61 y.o. female. ?Patient presents the emergency department with tailbone pain.  She says that 2 days ago she sustained a fall.  She says she was walking in the kitchen when she tripped and fell backwards onto her bottom.  She denies hitting her head or any loss of consciousness.  She was able to get up without any help and ambulate with her walker as per normal.  She says that the tailbone pain continued and has not gotten any better.  She says it hurts when she moves her hips, lays in bed, and turns. She denies any numbness, weakness, saddle anesthesia, bowel or bladder dysfunction, fevers, chills, IVDU, DM, or cancer. ? ?Fall ? ? ?  ? ?Home Medications ?Prior to Admission medications   ?Medication Sig Start Date End Date Taking? Authorizing Provider  ?oxyCODONE-acetaminophen (PERCOCET/ROXICET) 5-325 MG tablet Take 1 tablet by mouth every 6 (six) hours as needed for up to 5 days for severe pain. 04/15/21 04/20/21 Yes [place] Berkley, Finis Bud, PA-C  ?albuterol (VENTOLIN HFA) 108 (90 Base) MCG/ACT inhaler Inhale 1-2 puffs into the lungs every 6 (six) hours as needed for wheezing or shortness of breath. ?Patient not taking: No sig reported 09/26/20   Nche, Bonna Gains, NP  ?amoxicillin (AMOXIL) 500 MG capsule Take 500 mg by mouth 3 (three) times daily.    [provider]  ?chlorhexidine (PERIDEX) 0.12 % solution Use as directed 15 mLs in the mouth or throat 2 (two) times daily.    [provider]  ?cyanocobalamin (,VITAMIN B-12,) 1000 MCG/ML injection Injection once weekly x 4weeks then monthly. 09/27/20   Nche, Bonna Gains, NP  ?folic acid (FOLVITE) 1 MG tablet Take 1 tablet (1 mg total) by mouth daily.  09/27/20   Nche, Bonna Gains, NP  ?ibuprofen (ADVIL) 200 MG tablet Take 400 mg by mouth every 6 (six) hours as needed for mild pain.    [provider]  ?ibuprofen (ADVIL) 600 MG tablet Take 600 mg by mouth See admin instructions. Every 6 to 8 hours as needed for pain    [provider]  ?methylPREDNISolone (MEDROL DOSEPAK) 4 MG TBPK tablet Take 4-24 mg by mouth See admin instructions. 6 day taper    [provider]  ?Vitamin D, Ergocalciferol, (DRISDOL) 1.25 MG (50000 UNIT) CAPS capsule Take 1 capsule (50,000 Units total) by mouth every 7 (seven) days. 09/27/20   Nche, Bonna Gains, NP  ?   ? ?Allergies    ?Patient has no known allergies.   ? ?Review of Systems   ?Review of Systems  ?Musculoskeletal:  Positive for arthralgias.  ?All other systems reviewed and are negative. ? ?Physical Exam ?Updated Vital Signs ?BP (!) 154/102 (BP Location: Left Arm)   Pulse (!) 102   Temp 98.1 ?F (36.7 ?C) (Oral)   Resp 18   Ht 5\' 2"  (1.575 m)   Wt 99.8 kg   SpO2 94%   BMI 40.24 kg/m?  ?Physical Exam ?Vitals and nursing note reviewed.  ?Constitutional:   ?   General: She is not in acute distress. ?   Appearance: Normal appearance. She is well-developed. She is not ill-appearing, toxic-appearing or diaphoretic.  ?HENT:  ?  Head: Normocephalic and atraumatic.  ?   Nose: No nasal deformity.  ?   Mouth/Throat:  ?   Lips: Pink. No lesions.  ?Eyes:  ?   General: Gaze aligned appropriately. No scleral icterus.    ?   Right eye: No discharge.     ?   Left eye: No discharge.  ?   Conjunctiva/sclera: Conjunctivae normal.  ?   Right eye: Right conjunctiva is not injected. No exudate or hemorrhage. ?   Left eye: Left conjunctiva is not injected. No exudate or hemorrhage. ?Pulmonary:  ?   Effort: Pulmonary effort is normal. No respiratory distress.  ?Musculoskeletal:  ?   Comments: There is no C, T, or L-spine TTP or step-offs.  Patient does have reproducible sacral bone pain.  Pain is not present at  bilateral paraspinal musculature.  Patient is able to range both hips normally.  She has 2+ pedal pulses bilaterally.  Normal sensation distal. ?There is no other evidence of any bony trauma on exam.  ?Skin: ?   General: Skin is warm and dry.  ?Neurological:  ?   Mental Status: She is alert and oriented to person, place, and time.  ?Psychiatric:     ?   Mood and Affect: Mood normal.     ?   Speech: Speech normal.     ?   Behavior: Behavior normal. Behavior is cooperative.  ? ? ?ED Results / Procedures / Treatments   ?Labs ?(all labs ordered are listed, but only abnormal results are displayed) ?Labs Reviewed - No data to display ? ?EKG ?None ? ?Radiology ?DG Lumbar Spine Complete ? ?Result Date: 04/15/2021 ?CLINICAL DATA:  Tailbone pain after fall on Wednesday. EXAM: SACRUM AND COCCYX - 2+ VIEW; LUMBAR SPINE - COMPLETE 4+ VIEW COMPARISON:  MRI lumbar spine dated July 10, 2013. CT abdomen pelvis dated Jun 13, 2006. FINDINGS: Age-indeterminate mild superior endplate compression fractures at L3, L4, and L5, new since 2015. Chronic mild L1 compression deformity is unchanged. Alignment is normal. Disc heights are relatively preserved. Lower lumbar facet arthropathy. No sacral fracture. IMPRESSION: 1. Age-indeterminate mild L3, L4, and L5 superior endplate compression fractures, new since 2015. Correlate with point tenderness and consider further evaluation with MRI. 2. Unchanged chronic mild L1 compression deformity. 3. No acute osseous abnormality of the sacrum. Electronically Signed   By: Obie DredgeWilliam T Derry M.D.   On: 04/15/2021 15:34  ? ?DG Sacrum/Coccyx ? ?Result Date: 04/15/2021 ?CLINICAL DATA:  Tailbone pain after fall on Wednesday. EXAM: SACRUM AND COCCYX - 2+ VIEW; LUMBAR SPINE - COMPLETE 4+ VIEW COMPARISON:  MRI lumbar spine dated July 10, 2013. CT abdomen pelvis dated Jun 13, 2006. FINDINGS: Age-indeterminate mild superior endplate compression fractures at L3, L4, and L5, new since 2015. Chronic mild L1 compression  deformity is unchanged. Alignment is normal. Disc heights are relatively preserved. Lower lumbar facet arthropathy. No sacral fracture. IMPRESSION: 1. Age-indeterminate mild L3, L4, and L5 superior endplate compression fractures, new since 2015. Correlate with point tenderness and consider further evaluation with MRI. 2. Unchanged chronic mild L1 compression deformity. 3. No acute osseous abnormality of the sacrum. Electronically Signed   By: Obie DredgeWilliam T Derry M.D.   On: 04/15/2021 15:34  ? ?DG Hips Bilat W or Wo Pelvis 3-4 Views ? ?Result Date: 04/15/2021 ?CLINICAL DATA:  Tailbone pain after recent fall. EXAM: DG HIP (WITH OR WITHOUT PELVIS) 3-4V BILAT COMPARISON:  Right hip x-rays dated August 23, 2020. FINDINGS: No acute fracture or dislocation. Unchanged mild bilateral  hip joint space narrowing with small marginal osteophytes. The pubic symphysis and sacroiliac joints are intact. Soft tissues are unremarkable. IMPRESSION: 1. No acute osseous abnormality. 2. Unchanged mild bilateral hip osteoarthritis. Electronically Signed   By: Obie Dredge M.D.   On: 04/15/2021 15:35   ? ?Procedures ?Procedures  ? ?Medications Ordered in ED ?Medications  ?oxyCODONE-acetaminophen (PERCOCET/ROXICET) 5-325 MG per tablet 1 tablet (1 tablet Oral Given 04/15/21 1449)  ? ? ?ED Course/ Medical Decision Making/ A&P ?  ?                        ?Medical Decision Making ?Amount and/or Complexity of Data Reviewed ?Radiology: ordered. ? ?Risk ?Prescription drug management. ? ? ? ?MDM  ?This is a 61 y.o. female who presents to the ED with tail bone pain after a fall. ? ?My Impression, Plan, and ED Course: This was reportedly a mechanical fall 2 days ago.  Patient has sustained a tailbone pain.  She has ambulated normal since then with her walker.  She has no evidence of any head injury, spinal injury, no red flag symptoms for lower back pain, no other bony injury, no evidence of bleeding.  We will proceed with pelvic, sacrum, lumbar spine plain  films. ? ?I personally ordered, reviewed, and interpreted all laboratory work and imaging and agree with radiologist interpretation. Results interpreted below: Hip/Pelvic films normal ?Lumbar films with age indetermina

## 2021-04-15 NOTE — Telephone Encounter (Signed)
Patients sister called to state that [facility] [address] [facility] not [facility] prescribed [facility] in [facility]. [facility] [address] [facility] ( [facility] double checked [facility] [address]. ). Messaged provider to change prescription to the [address] [place] location. ?

## 2021-04-15 NOTE — ED Triage Notes (Signed)
Pt reports fall from standing on Wednesday where she injured her tailbone. Denies hitting head, no LOC, no blood thinners.  ?

## 2021-04-15 NOTE — Discharge Instructions (Addendum)
There is concern for a possible compression fracture of your lower spine. We cannot really tell if this is an old or a new injury on plain films. I suspect that these are old because you did not have any tenderness in that area. There is no fracture to your sacrum which is the area where your pain is at.  I will discharge you home with a course of pain meds. Please do not take these prior to driving or operating heavy machinery. You should follow up with orthopedics, Dr. Marcelino Scot. Please call on Monday. ?

## 2021-04-24 ENCOUNTER — Other Ambulatory Visit: Payer: Self-pay | Admitting: Orthopedic Surgery

## 2021-04-24 DIAGNOSIS — S32000A Wedge compression fracture of unspecified lumbar vertebra, initial encounter for closed fracture: Secondary | ICD-10-CM

## 2021-04-25 ENCOUNTER — Other Ambulatory Visit: Payer: Self-pay | Admitting: Orthopedic Surgery

## 2021-04-25 DIAGNOSIS — S32000A Wedge compression fracture of unspecified lumbar vertebra, initial encounter for closed fracture: Secondary | ICD-10-CM

## 2021-04-25 DIAGNOSIS — M810 Age-related osteoporosis without current pathological fracture: Secondary | ICD-10-CM

## 2021-04-27 ENCOUNTER — Telehealth: Payer: Self-pay

## 2021-04-27 NOTE — Telephone Encounter (Signed)
Patient contacted to schedule mammogram.  ? ?RE: Mobile Mammo event located at: ? ?[facility]  ?      ?[address]    ?[place] [address]    ? ?Date: April 7th  ? ? ?LVM for pt to call back as soon as possible.  ? ?

## 2021-05-03 ENCOUNTER — Ambulatory Visit
Admission: RE | Admit: 2021-05-03 | Discharge: 2021-05-03 | Disposition: A | Discharge status: Home / Self Care | Payer: 59 | Source: Ambulatory Visit | Attending: Orthopedic Surgery | Admitting: Orthopedic Surgery

## 2021-05-03 DIAGNOSIS — S32000A Wedge compression fracture of unspecified lumbar vertebra, initial encounter for closed fracture: Secondary | ICD-10-CM

## 2021-05-03 DIAGNOSIS — M810 Age-related osteoporosis without current pathological fracture: Secondary | ICD-10-CM

## 2021-05-17 ENCOUNTER — Other Ambulatory Visit: Payer: Self-pay

## 2021-06-13 ENCOUNTER — Other Ambulatory Visit: Payer: Self-pay | Admitting: Orthopedic Surgery

## 2021-06-13 DIAGNOSIS — M545 Low back pain, unspecified: Secondary | ICD-10-CM

## 2021-07-08 ENCOUNTER — Ambulatory Visit
Admission: RE | Admit: 2021-07-08 | Discharge: 2021-07-08 | Disposition: A | Discharge status: Home / Self Care | Payer: 59 | Source: Ambulatory Visit | Attending: Orthopedic Surgery | Admitting: Orthopedic Surgery

## 2021-07-08 DIAGNOSIS — M545 Low back pain, unspecified: Secondary | ICD-10-CM

## 2021-09-22 NOTE — Telephone Encounter (Signed)
Na

## 2021-12-06 ENCOUNTER — Ambulatory Visit (INDEPENDENT_AMBULATORY_CARE_PROVIDER_SITE_OTHER): Payer: 59 | Admitting: Pulmonary Disease

## 2021-12-06 ENCOUNTER — Encounter: Payer: Self-pay | Admitting: Pulmonary Disease

## 2021-12-06 VITALS — BP 128/80 | HR 84 | Temp 98.0°F | Ht 66.0 in | Wt 282.4 lb

## 2021-12-06 DIAGNOSIS — G4733 Obstructive sleep apnea (adult) (pediatric): Secondary | ICD-10-CM | POA: Diagnosis not present

## 2021-12-06 DIAGNOSIS — G4719 Other hypersomnia: Secondary | ICD-10-CM | POA: Diagnosis not present

## 2021-12-06 DIAGNOSIS — R0602 Shortness of breath: Secondary | ICD-10-CM

## 2021-12-06 NOTE — Progress Notes (Signed)
Lauren Moore    SD:[id number]    1960/06/16  Primary Care Physician:Jackson, Verl Dicker, PA-C  Referring Physician: No referring provider defined for this encounter.  Chief complaint:   Patient being seen for shortness of breath, sleep disordered breathing  HPI:  Had a sleep study done in 2019 showing severe obstructive sleep apnea Did not have any follow-up after that, not on CPAP  Weight as continue to increase Assistant notices some difficulty breathing when she is just sitting there  Usually goes to bed between 9 and 10, falls asleep in about 15 minutes About 4 awakenings Final wake up time about 9 AM in the morning  She will take a nap multiple times during the day Has not been as active  She is limited with activities of daily living only able to walk about a block She will feel weak with exertion  Denies any significant dryness of her mouth in the morning Occasional headaches No chest pains or chest discomfort Memory is fair  Reformed smoker quit over 10 years ago was smoking only a few cigarettes a day  Admits to shortness of breath with activity, occasional nonproductive cough weight continues to increase She does have some joint pains   Outpatient Encounter Medications as of 12/06/2021  Medication Sig   albuterol (VENTOLIN HFA) 108 (90 Base) MCG/ACT inhaler Inhale 1-2 puffs into the lungs every 6 (six) hours as needed for wheezing or shortness of breath. (Patient not taking: Reported on 09/27/2020)   amoxicillin (AMOXIL) 500 MG capsule Take 500 mg by mouth 3 (three) times daily. (Patient not taking: Reported on 12/06/2021)   chlorhexidine (PERIDEX) 0.12 % solution Use as directed 15 mLs in the mouth or throat 2 (two) times daily. (Patient not taking: Reported on 12/06/2021)   cyanocobalamin (,VITAMIN B-12,) 1000 MCG/ML injection Injection once weekly x 4weeks then monthly. (Patient not taking: Reported on [id number])   folic acid (FOLVITE) 1 MG  tablet Take 1 tablet (1 mg total) by mouth daily. (Patient not taking: Reported on 12/06/2021)   ibuprofen (ADVIL) 200 MG tablet Take 400 mg by mouth every 6 (six) hours as needed for mild pain. (Patient not taking: Reported on 12/06/2021)   ibuprofen (ADVIL) 600 MG tablet Take 600 mg by mouth See admin instructions. Every 6 to 8 hours as needed for pain (Patient not taking: Reported on 12/06/2021)   methylPREDNISolone (MEDROL DOSEPAK) 4 MG TBPK tablet Take 4-24 mg by mouth See admin instructions. 6 day taper (Patient not taking: Reported on 12/06/2021)   Vitamin D, Ergocalciferol, (DRISDOL) 1.25 MG (50000 UNIT) CAPS capsule Take 1 capsule (50,000 Units total) by mouth every 7 (seven) days. (Patient not taking: Reported on 12/06/2021)   No facility-administered encounter medications on file as of 12/06/2021.    Allergies as of 12/06/2021   (No Known Allergies)    Past Medical History:  Diagnosis Date   Brain injury    Hypertension    Obesity     Past Surgical History:  Procedure Laterality Date   BRAIN SURGERY     COLONOSCOPY N/A 06/26/2012   Procedure: COLONOSCOPY;  Surgeon: Missy Sabins, MD;  Location: [facility];  Service: Endoscopy;  Laterality: N/A;   TYMPANOPLASTY Right 11/25/2017   Procedure: TYMPANOPLASTY;  Surgeon: Izora Gala, MD;  Location: [facility];  Service: ENT;  Laterality: Right;    Family History  Problem Relation Age of Onset   Cancer Sister  Lung   Hypertension Sister    Diabetes Sister    Cancer Brother        Lung   Stroke Brother    Hypertension Brother     Social History   Socioeconomic History   Marital status: Single    Spouse name: Not on file   Number of children: Not on file   Years of education: Not on file   Highest education level: Not on file  Occupational History   Not on file  Tobacco Use   Smoking status: Never   Smokeless tobacco: Never  Vaping Use   Vaping Use: Never used  Substance and Sexual Activity    Alcohol use: Yes    Comment: daily beer and liquor/3 beers and 5 shots of liquor   Drug use: Never    Types: Marijuana    Comment: states not smoked in one year   Sexual activity: Not Currently  Other Topics Concern   Not on file  Social History Narrative   ** Merged History Encounter **       Lives in boarding house with friends   Social Determinants of Health   Financial Resource Strain: Not on file  Food Insecurity: Not on file  Transportation Needs: Not on file  Physical Activity: Not on file  Stress: Not on file  Social Connections: Not on file  Intimate Partner Violence: Not on file    Review of Systems  Constitutional:  Positive for fatigue.  Respiratory:  Positive for shortness of breath.   Psychiatric/Behavioral:  Positive for sleep disturbance.     There were no vitals filed for this visit.   Physical Exam Constitutional:      Appearance: She is obese.  HENT:     Head: Normocephalic.     Mouth/Throat:     Mouth: Mucous membranes are moist.     Comments: Mallampati 3, crowded oropharynx Eyes:     Pupils: Pupils are equal, round, and reactive to light.  Cardiovascular:     Rate and Rhythm: Normal rate and regular rhythm.     Heart sounds: No murmur heard.    No friction rub.  Pulmonary:     Effort: No respiratory distress.     Breath sounds: No stridor. No wheezing or rhonchi.  Musculoskeletal:     Cervical back: No rigidity or tenderness.  Neurological:     Mental Status: She is alert.  Psychiatric:        Mood and Affect: Mood normal.    Epworth Sleepiness Scale of 12  Data Reviewed: Previous sleep study from 2019 reviewed by myself showing she was titrated to CPAP therapy Study showed AHI of over 32, titrated to CPAP of 17  Assessment:  History of severe obstructive sleep apnea -Untreated at present  Excessive daytime sleepiness related to sleep disordered breathing  Shortness of breath on exertion -Likely related to  deconditioning -Overall weakness  Does have a past history of smoking  Pathophysiology of sleep disordered breathing discussed with her and her sister  Treatment options discussed  Plan/Recommendations: Risks with not treating sleep disordered breathing discussed  We will schedule patient for an in lab sleep study  There is potential for hypoventilation because of increasing weight  Graded exercises recommended  Weight loss efforts recommended  I will see her back in about 3 to 4 months  We will get a PFT at next visit  Encouraged to call with significant concerns    Virl Diamond MD [facility] 12/06/2021, 8:38 AM  CC: No ref. provider found

## 2021-12-06 NOTE — Addendum Note (Signed)
Addended by: Glynda Jaeger on: 12/06/2021 09:36 AM   Modules accepted: Orders

## 2021-12-06 NOTE — Patient Instructions (Addendum)
Schedule for split-night study  Encouraged regular exercises and weight loss as tolerated  Call us with significant concerns  Follow-up in 3 to 4 months  Schedule PFT at next visit  Living With Sleep Apnea Sleep apnea is a condition in which breathing pauses or becomes shallow during sleep. Sleep apnea is most commonly caused by a collapsed or blocked airway. People with sleep apnea usually snore loudly. They may have times when they gasp and stop breathing for 10 seconds or more during sleep. This may happen many times during the night. The breaks in breathing also interrupt the deep sleep that you need to feel rested. Even if you do not completely wake up from the gaps in breathing, your sleep may not be restful and you feel tired during the day. You may also have a headache in the morning and low energy during the day, and you may feel anxious or depressed. How can sleep apnea affect me? Sleep apnea increases your chances of extreme tiredness during the day (daytime fatigue). It can also increase your risk for health conditions, such as: Heart attack. Stroke. Obesity. Type 2 diabetes. Heart failure. Irregular heartbeat. High blood pressure. If you have daytime fatigue as a result of sleep apnea, you may be more likely to: Perform poorly at school or work. Fall asleep while driving. Have difficulty with attention. Develop depression or anxiety. Have sexual dysfunction. What actions can I take to manage sleep apnea? Sleep apnea treatment  If you were given a device to open your airway while you sleep, use it only as told by your health care provider. You may be given: An oral appliance. This is a custom-made mouthpiece that shifts your lower jaw forward. A continuous positive airway pressure (CPAP) device. This device blows air through a mask when you breathe out (exhale). A nasal expiratory positive airway pressure (EPAP) device. This device has valves that you put into each  nostril. A bi-level positive airway pressure (BIPAP) device. This device blows air through a mask when you breathe in (inhale) and breathe out (exhale). You may need surgery if other treatments do not work for you. Sleep habits Go to sleep and wake up at the same time every day. This helps set your internal clock (circadian rhythm) for sleeping. If you stay up later than usual, such as on weekends, try to get up in the morning within 2 hours of your normal wake time. Try to get at least 7-9 hours of sleep each night. Stop using a computer, tablet, and mobile phone a few hours before bedtime. Do not take long naps during the day. If you nap, limit it to 30 minutes. Have a relaxing bedtime routine. Reading or listening to music may relax you and help you sleep. Use your bedroom only for sleep. Keep your television and computer out of your bedroom. Keep your bedroom cool, dark, and quiet. Use a supportive mattress and pillows. Follow your health care provider's instructions for other changes to sleep habits. Nutrition Do not eat heavy meals in the evening. Do not have caffeine in the later part of the day. The effects of caffeine can last for more than 5 hours. Follow your health care provider's or dietitian's instructions for any diet changes. Lifestyle     Do not drink alcohol before bedtime. Alcohol can cause you to fall asleep at first, but then it can cause you to wake up in the middle of the night and have trouble getting back to sleep. Do  not use any products that contain nicotine or tobacco. These products include cigarettes, chewing tobacco, and vaping devices, such as e-cigarettes. If you need help quitting, ask your health care provider. Medicines Take over-the-counter and prescription medicines only as told by your health care provider. Do not use over-the-counter sleep medicine. You can become dependent on this medicine, and it can make sleep apnea worse. Do not use medicines,  such as sedatives and narcotics, unless told by your health care provider. Activity Exercise on most days, but avoid exercising in the evening. Exercising near bedtime can interfere with sleeping. If possible, spend time outside every day. Natural light helps regulate your circadian rhythm. General information Lose weight if you need to, and maintain a healthy weight. Keep all follow-up visits. This is important. If you are having surgery, make sure to tell your health care provider that you have sleep apnea. You may need to bring your device with you. Where to find more information Learn more about sleep apnea and daytime fatigue from: American Sleep Association: sleepassociation.org National Sleep Foundation: sleepfoundation.org National Heart, Lung, and Blood Institute: [employer] Summary Sleep apnea is a condition in which breathing pauses or becomes shallow during sleep. Sleep apnea can cause daytime fatigue and other serious health conditions. You may need to wear a device while sleeping to help keep your airway open. If you are having surgery, make sure to tell your health care provider that you have sleep apnea. You may need to bring your device with you. Making changes to sleep habits, diet, lifestyle, and activity can help you manage sleep apnea. This information is not intended to replace advice given to you by your health care provider. Make sure you discuss any questions you have with your health care provider. Document Revised: 08/24/2020 Document Reviewed: 12/25/2019 Elsevier Patient Education  2023 [employer].

## 2022-01-02 ENCOUNTER — Ambulatory Visit (HOSPITAL_BASED_OUTPATIENT_CLINIC_OR_DEPARTMENT_OTHER): Payer: 59 | Attending: Pulmonary Disease | Admitting: Pulmonary Disease

## 2022-01-02 DIAGNOSIS — G4719 Other hypersomnia: Secondary | ICD-10-CM | POA: Insufficient documentation

## 2022-01-02 DIAGNOSIS — I493 Ventricular premature depolarization: Secondary | ICD-10-CM | POA: Insufficient documentation

## 2022-01-02 DIAGNOSIS — G4733 Obstructive sleep apnea (adult) (pediatric): Secondary | ICD-10-CM | POA: Diagnosis present

## 2022-01-09 ENCOUNTER — Other Ambulatory Visit: Payer: Self-pay | Admitting: Sports Medicine

## 2022-01-09 ENCOUNTER — Ambulatory Visit
Admission: RE | Admit: 2022-01-09 | Discharge: 2022-01-09 | Disposition: A | Discharge status: Home / Self Care | Payer: 59 | Source: Ambulatory Visit | Attending: Sports Medicine | Admitting: Sports Medicine

## 2022-01-09 ENCOUNTER — Ambulatory Visit (INDEPENDENT_AMBULATORY_CARE_PROVIDER_SITE_OTHER): Payer: 59 | Admitting: Sports Medicine

## 2022-01-09 VITALS — BP 140/98 | Ht 66.0 in | Wt 282.0 lb

## 2022-01-09 DIAGNOSIS — M25511 Pain in right shoulder: Secondary | ICD-10-CM

## 2022-01-09 DIAGNOSIS — M25561 Pain in right knee: Secondary | ICD-10-CM | POA: Diagnosis not present

## 2022-01-09 DIAGNOSIS — M25562 Pain in left knee: Secondary | ICD-10-CM

## 2022-01-09 DIAGNOSIS — M25512 Pain in left shoulder: Secondary | ICD-10-CM | POA: Diagnosis not present

## 2022-01-09 MED ORDER — MELOXICAM 15 MG PO TABS: ORAL_TABLET | ORAL | 0 refills | Status: DC

## 2022-01-09 NOTE — Progress Notes (Addendum)
SUBJECTIVE:   Chief Complaint: shoulder and knee pain  History of Presenting Illness:   Lauren Moore is a 61 year old female with a past medical history of hip pain who presents with bilateral shoulder and knee pain.   She states that her shoulder pain started about five years ago. Denies injury, fall, or obvious inciting event. Pain is located diffusely over lateral aspect of shoulder over deltoid. Describes it as achy, intense, constant, and it awakens her at night. She has tried an oral aspirin-caffeine medication and acetaminophen, neither of which have provided any relief. Additionally, she has tried [facility], which has not helped. Denies numbness, tingling, and radiation into the back or arm.  Her knee pain also started several years ago. She describes it as achy and located on the medial aspect of both knees at the level of the patella. Standing for long periods of time exacerbates the pain. She has tried aspirin-caffeine, acetaminophen, Icy Hot cream, and a knee brace but none of these interventions have provided any relief. Denies numbness, tingling, and radiation into the lower back or feet.  Of note, the patient experienced three mechanical falls within the past year. She now intermittently uses a walker that she borrows from her sister. A non-weight-bearing radiograph of her knees performed on 08-2020 demonstrated bulky osteophyte arising from the medial femoral condyle without involvement of joint space.   MRI 06-2021: 1. Diffuse spinal compression fractures T12 through L5, largely new since 2015 and associated with heterogeneous underlying bone marrow signal. It is difficult to exclude pathologic fractures due to Metastatic disease or Multiple Myeloma. However, the lumbar spinal compression does not appear significantly changed from an April Lumbar Spine CT. 2. Underlying capacious spinal canal. No significant retropulsion or spinal stenosis. 3. Conspicuous posterior paraspinal  muscle atrophy is nonspecific. There is generally mild lumbar spine degeneration, except at L5-S1 where chronic facet arthropathy and developing facet ankylosis are demonstrated.   Pertinent Medical History:   Past Medical History:  Diagnosis Date   Brain injury (HCC)    Hypertension    Obesity       OBJECTIVE:   There were no vitals taken for this visit.  Physical Examination:  Shoulders   Inspection ----- Symmetric, no swelling or discoloration Motion --------- Limited bilaterally more on left   Right: abduction 100, flexion 120, internal rotation 90, external rotation 40   Left: abduction 80, flexion 85, internal rotation 90, external rotation 30 Palpation ------ Tenderness present over deltoid bilaterally Sensation ----- Light touch intact across dermatomes C5-C8 Strength ------- 5-/5 with flexion, extension, internal rotation, and external rotation bilaterally   Pain reproduced with flexion and external rotation against resistance on L Tests ----------- Empty can + on L, Hawkins + on L, bear hug - bilaterally, lift off - bilaterally  Knees   Inspection ----- Symmetric, no swelling or discoloration Motion --------- Symmetric extension 0 Palpation ------ Focal joint line tenderness lateral to patella bilaterally Sensation ----- Light touch intact across dermatomes L4-S1 Strength ------- 5/5 extension and 5-/5 flexion Tests ----------- No varus-valgus instability   ASSESSMENT/PLAN:   Patient's presentation is concerning for a left-sided rotator cuff injury and possible bilateral knee osteoarthritis. Further diagnostic workup is warranted and will begin with radiographic evaluations of bilateral shoulders plus knees to identify presence or extent of osteoarthritis in these joints.  > Radiograph of shoulders: bilateral AP and axillary views > Radiograph of knees: bilateral standing AP, lateral, and sunrise views > Meloxicam 15mg  q24, one week duration as needed  for  symptomatic relief    There are no diagnoses linked to this encounter.  No follow-ups on file.  Lajuana Ripple, MD  01/09/2022, 1:31 PM    Patient seen and evaluated with the resident.  I agree with the above plan of care.  Bilateral shoulder and bilateral knee pain are chronic issues for this patient.  I will start with getting x-rays to evaluate the degree of osteoarthritis present.  I will then call her sister with those results (this is at the patient's request).  In the meantime, we will trial meloxicam 15 mg daily for 1 week.  This note was dictated using Dragon naturally speaking software and may contain errors in syntax, spelling, or content which have not been identified prior to signing this note.

## 2022-01-15 ENCOUNTER — Other Ambulatory Visit: Payer: Self-pay

## 2022-01-15 ENCOUNTER — Telehealth: Payer: Self-pay | Admitting: Sports Medicine

## 2022-01-15 DIAGNOSIS — G8929 Other chronic pain: Secondary | ICD-10-CM

## 2022-01-15 NOTE — Telephone Encounter (Signed)
  I left a message on the patient's sisters voicemail (per the patient's request) after reviewing x-ray findings of both shoulders and knees.  X-ray of the left shoulder shows subcortical cystic changes in the lateral humeral head which is worrisome for rotator cuff arthropathy.  I recommended that we evaluate this further with an MRI.  Right shoulder x-ray also shows some arthritic change but we will hold on further diagnostic imaging here for now.  X-rays of both knees show only mild degenerative changes.  I will plan on scheduling a follow-up visit with this patient and her sister after her MRI of her shoulder.  We may plan on injecting each knee with cortisone at that visit as well.  This note was dictated using Dragon naturally speaking software and may contain errors in syntax, spelling, or content which have not been identified prior to signing this note.

## 2022-01-19 ENCOUNTER — Telehealth: Payer: Self-pay | Admitting: Pulmonary Disease

## 2022-01-19 DIAGNOSIS — G4733 Obstructive sleep apnea (adult) (pediatric): Secondary | ICD-10-CM

## 2022-01-19 NOTE — Telephone Encounter (Signed)
Call patient  Sleep study result  Date of study: 01/02/2022  Impression: Severe obstructive sleep apnea, most prominent during supine sleep  Recommendation: DME referral  - Auto CPAP with pressure settings of 10-20 may be tried for position variable obstructive sleep apnea. -Medium sized Fisher-Paykel fullface Vitera mask and heated humidification  Encourage none supine sleep  Encourage weight loss measures  Follow-up in the office 4 to 6 weeks following initiation of treatment

## 2022-01-19 NOTE — Telephone Encounter (Signed)
Called the pt and there was no answer- LMTCB    

## 2022-01-19 NOTE — Procedures (Signed)
POLYSOMNOGRAPHY  Last, First: Lauren, Moore MRN: [id number] Gender: Female Age (years): 61 Weight (lbs): 280 DOB: 06-24-1960 BMI: 45 Primary Care: No PCP Epworth Score: 9 Referring: Tomma Lightning MD Technician: Shelah Lewandowsky Interpreting: Tomma Lightning MD Study Type: Split Night CPAP Ordered Study Type: Split Night CPAP Study date: 01/02/2022 Location: [place] CLINICAL INFORMATION Lauren Moore is a 61 year old Female and was referred to the sleep center for evaluation of G47.33 OSA: Adult and Pediatric (327.23), G47.80 Other Sleep Disorders. Indications include Excessive Daytime Sleepiness, Fatigue, Obesity, Snoring.   Most recent polysomnogram dated 01/01/2018 revealed an AHI of 32.6/h and RDI of 45.9/h. Most recent titration study dated 01/01/2018 was optimal at 17cm H2O with an AHI of 11.4/h. MEDICATIONS Patient self administered medications include: N/A. Medications administered during study include No sleep medicine administered.  SLEEP STUDY TECHNIQUE The patient underwent an attended overnight level one polysomnography titration to assess the effects of CPAP therapy. The following variables were monitored: EEG (C4-A1, C3-A2, O1-A2, O2-A1), EOG, submental and leg EMG, ECG, oxyhemoglobin saturation by pulse oximetry, thoracic and abdominal respiratory effort belts, nasal/oral airflow by pressure sensor, body position sensor and snoring sensor. CPAP pressure was titrated to eliminate apneas, hypopneas and oxygen desaturation. Hypopneas were scored per AASM definition IB (4% desaturation)  The NPSG portion of the study ended at 1:58:34 AM . The CPAP titration was initiated at 2:02:33 AM AM with the CPAP portion of the study ending at 5:14:24 AM.  TECHNICIAN COMMENTS Comments added by Technician: NONE Comments added by Scorer: N/A SLEEP ARCHITECTURE The recording time for the entire night was 380.9 minutes. The diagnostic portion was initiated at 10:53:32 PM and  terminated at 1:58:34 AM. The time in bed was 185.0 minutes. EEG confirmed total sleep time was 124.0 minutes yielding a sleep efficiency of 67.0%. Sleep onset after lights out was 23.2 minutes with a REM latency of 53.0 minutes. The patient spent 15.3% of the night in stage N1 sleep, 64.9% in stage N2 sleep, 0.0% in stage N3 and 19.8% in REM. The Arousal Index was 18.4/hour.  The titration portion was initiated at 2:02:33 AM and terminated at 5:14:24 AM. The time in bed was 191.8 minutes. EEG confirmed total sleep time was 173.0 minutes yielding a sleep efficiency of 90.2%. Sleep onset after CPAP initiation was 6.1 minutes with a REM latency of 14.5 minutes. The patient spent 4.9% of the night in stage N1 sleep, 51.4% in stage N2 sleep, 0.0% in stage N3 and 43.6% in REM. The Arousal Index was 10.4/hour. RESPIRATORY PARAMETERS During the diagnostic portion, there were a total of 106 respiratory disturbances recorded; 2 apneas ( 2 obstructive, 0 mixed, 0 central), 91 hypopneas and 13 RERAs. The apnea/hypopnea index 45.0 was events/hour and the RDI was 51.3 events/hour. The central sleep apnea index was 0.0 events/hour. The REM AHI was 71.0 /h and NREM AHI was 38.6/h. The REM RDI was 71.0 /h and NREM RDI was 46.4 /h. The supine AHI was 39.6/h, and the non supine AHI was 48.3/h; supine during 37.9% of sleep. The supine RDI was 47.2/h, and the non supine RDI was 53.77/h. Respiratory disturbances were associated with oxygen desaturation down to a nadir of 51.0 % during sleep. The mean oxygen saturation during the study was 87.1%. The cumulative time under 88% oxygen saturation was 61.7 minutes.  During the titration portion, the apnea/hypopnea index (AHI) was 34.0 events/hour and the RDI was 36.1 events/hour. The central sleep apnea index was events/hour. The most appropriate  setting of CPAP was IPAP/EPAP 20/20 cm H2O. At this setting, the sleep efficiency was 70% and the patient was supine for 100%. The AHI was  49.2 events per hour(with 0 central events). Oxygen nadir was 85.0. LEG MOVEMENT DATA The periodic limb movement index was 0.0/hour with an associated arousal index of /hour. CARDIAC DATA The underlying cardiac rhythm was most consistent with sinus rhythm. Mean heart rate was 74.6 during diagnostic portion and 69.5 during titration portion of study. Additional rhythm abnormalities include PVCs.  IMPRESSIONS - Severe Obstructive Sleep apnea(OSA) Optimal pressure attained. - Electrocardiographic data showed presence of PVCs. - No Significant Central Sleep Apnea (CSA) - Severe Oxygen Desaturation - The patient snored with moderate snoring volume. - EEG did not show alpha intrusion. - No significant periodic leg movements(PLMs) during sleep. However, no significant associated arousals. - Reduced sleep efficiency, long primary sleep latency, short REM sleep latency and no slow wave latency.  DIAGNOSIS - Obstructive Sleep Apnea (G47.33)  RECOMMENDATIONS - Trial of CPAP therapy on 20 cm H2O with a Medium size Fisher&Paykel Full Face Vitera mask and heated humidification, there were events noted with supine sleep. - Auto CPAP with pressure settings of 10-20 may be tried for position variable obstructive sleep apnea. -Encourage non supine sleep. - Avoid alcohol, sedatives and other CNS depressants that may worsen sleep apnea and disrupt normal sleep architecture. - Sleep hygiene should be reviewed to assess factors that may improve sleep quality. - Weight management and regular exercise should be initiated or continued. - Return to Sleep Center for re-evaluation after 4 weeks of therapy  [Electronically signed] 01/19/2022 05:14 AM  Virl Diamond MD NPI: [id number]

## 2022-01-23 NOTE — Telephone Encounter (Signed)
Patient is aware of results and voiced her understanding.  She agrees with treatment plan. Order has been placed to adapt for cpap.  She will call nack to schedule f/u once setup on machine.  Nothing further needed.

## 2022-01-23 NOTE — Addendum Note (Signed)
Addended by: Lajoyce Lauber A on: 01/23/2022 11:33 AM   Modules accepted: Orders

## 2022-01-25 ENCOUNTER — Other Ambulatory Visit: Payer: Self-pay | Admitting: Physician Assistant

## 2022-01-25 DIAGNOSIS — Z1231 Encounter for screening mammogram for malignant neoplasm of breast: Secondary | ICD-10-CM

## 2022-02-13 ENCOUNTER — Ambulatory Visit
Admission: RE | Admit: 2022-02-13 | Discharge: 2022-02-13 | Disposition: A | Discharge status: Home / Self Care | Payer: 59 | Source: Ambulatory Visit | Attending: Sports Medicine | Admitting: Sports Medicine

## 2022-02-13 DIAGNOSIS — G8929 Other chronic pain: Secondary | ICD-10-CM

## 2022-02-19 ENCOUNTER — Telehealth: Payer: Self-pay

## 2022-02-19 NOTE — Telephone Encounter (Signed)
Per Dr. Micheline Chapman: Lauren Moore with pt's sister and let her know the MRI for [name] shows a rotator cuff tear that may need surgery.  He would recommend either Dr. Griffin Basil or Dr. Mardelle Matte unless they have a preference.  Pt ok with Dr. Alinda Money recommendation.  Appt scheduled with Dr. Griffin Basil on 02/20/22 @ 10:45 am.  [name]

## 2022-02-20 ENCOUNTER — Other Ambulatory Visit: Payer: Self-pay | Admitting: Orthopaedic Surgery

## 2022-02-20 DIAGNOSIS — Z01818 Encounter for other preprocedural examination: Secondary | ICD-10-CM

## 2022-02-26 ENCOUNTER — Ambulatory Visit: Payer: 59 | Admitting: Pulmonary Disease

## 2022-03-20 ENCOUNTER — Ambulatory Visit
Admission: RE | Admit: 2022-03-20 | Discharge: 2022-03-20 | Disposition: A | Discharge status: Home / Self Care | Payer: 59 | Source: Ambulatory Visit | Attending: Physician Assistant | Admitting: Physician Assistant

## 2022-03-20 DIAGNOSIS — Z1231 Encounter for screening mammogram for malignant neoplasm of breast: Secondary | ICD-10-CM

## 2022-03-23 ENCOUNTER — Other Ambulatory Visit: Payer: 59

## 2022-04-12 ENCOUNTER — Ambulatory Visit
Admission: RE | Admit: 2022-04-12 | Discharge: 2022-04-12 | Disposition: A | Discharge status: Home / Self Care | Payer: 59 | Source: Ambulatory Visit | Attending: Orthopaedic Surgery | Admitting: Orthopaedic Surgery

## 2022-04-12 DIAGNOSIS — Z01818 Encounter for other preprocedural examination: Secondary | ICD-10-CM

## 2022-05-10 ENCOUNTER — Ambulatory Visit (INDEPENDENT_AMBULATORY_CARE_PROVIDER_SITE_OTHER): Payer: 59 | Admitting: Pulmonary Disease

## 2022-05-10 ENCOUNTER — Encounter: Payer: Self-pay | Admitting: Pulmonary Disease

## 2022-05-10 VITALS — BP 138/82 | HR 63 | Ht 64.0 in | Wt 287.0 lb

## 2022-05-10 DIAGNOSIS — G4733 Obstructive sleep apnea (adult) (pediatric): Secondary | ICD-10-CM | POA: Diagnosis not present

## 2022-05-10 NOTE — Progress Notes (Signed)
Lauren Moore    [id number]    [birth year]  Primary Care Physician:Jackson, Rondel OhKerra J, PA-C  Referring Physician: Barnie MortJackson, Kerra J, PA-C [address] [place] [id number]  Chief complaint:   Patient being seen for shortness of breath, sleep disordered breathing  HPI:  Had a sleep study in 2019 Had a repeat sleep study recently December 2023 showing severe obstructive sleep apnea Now currently on auto CPAP-5-20  She is sleeping very well Waking up feeling like she is at a good nights rest Still does require to take a nap during the day but overall feeling much better  Tolerating CPAP well with no significant problems  Usually goes to bed between 9 and 10, final wake up time about 9 AM Feels she is tolerating the CPAP well  She is limited with activities of daily living only able to walk about a block She will feel weak with exertion Does have a lot of knee pain with the knee giving out on once in a while  Denies any significant dryness of her mouth in the morning Occasional headaches No chest pains or chest discomfort Memory is fair  Reformed smoker quit over 10 years ago was smoking only a few cigarettes a day  Admits to shortness of breath with activity, occasional nonproductive cough weight continues to increase She does have some joint pains   Outpatient Encounter Medications as of 05/10/2022  Medication Sig   albuterol (VENTOLIN HFA) 108 (90 Base) MCG/ACT inhaler Inhale 1-2 puffs into the lungs every 6 (six) hours as needed for wheezing or shortness of breath. (Patient not taking: Reported on 05/10/2022)   amoxicillin (AMOXIL) 500 MG capsule Take 500 mg by mouth 3 (three) times daily. (Patient not taking: Reported on 05/10/2022)   chlorhexidine (PERIDEX) 0.12 % solution Use as directed 15 mLs in the mouth or throat 2 (two) times daily. (Patient not taking: Reported on 05/10/2022)   cyanocobalamin (,VITAMIN B-12,) 1000 MCG/ML  injection Injection once weekly x 4weeks then monthly. (Patient not taking: Reported on 05/10/2022)   folic acid (FOLVITE) 1 MG tablet Take 1 tablet (1 mg total) by mouth daily. (Patient not taking: Reported on 05/10/2022)   ibuprofen (ADVIL) 200 MG tablet Take 400 mg by mouth every 6 (six) hours as needed for mild pain. (Patient not taking: Reported on 05/10/2022)   ibuprofen (ADVIL) 600 MG tablet Take 600 mg by mouth See admin instructions. Every 6 to 8 hours as needed for pain (Patient not taking: Reported on 05/10/2022)   meloxicam (MOBIC) 15 MG tablet Take one pill a day with food for 7 days and then prn thereafter (Patient not taking: Reported on 05/10/2022)   methylPREDNISolone (MEDROL DOSEPAK) 4 MG TBPK tablet Take 4-24 mg by mouth See admin instructions. 6 day taper (Patient not taking: Reported on 05/10/2022)   Vitamin D, Ergocalciferol, (DRISDOL) 1.25 MG (50000 UNIT) CAPS capsule Take 1 capsule (50,000 Units total) by mouth every 7 (seven) days. (Patient not taking: Reported on 05/10/2022)   No facility-administered encounter medications on file as of 05/10/2022.    Allergies as of 05/10/2022   (No Known Allergies)    Past Medical History:  Diagnosis Date   Brain injury    Hypertension    Obesity     Past Surgical History:  Procedure Laterality Date   BRAIN SURGERY     COLONOSCOPY N/A 06/26/2012   Procedure: COLONOSCOPY;  Surgeon: Barrie FolkJohn C Hayes, MD;  Location: [facility];  Service: Endoscopy;  Laterality: N/A;   TYMPANOPLASTY Right 11/25/2017   Procedure: TYMPANOPLASTY;  Surgeon: Serena Colonel, MD;  Location: [facility];  Service: ENT;  Laterality: Right;    Family History  Problem Relation Age of Onset   Cancer Sister        Lung   Hypertension Sister    Diabetes Sister    Cancer Brother        Lung   Stroke Brother    Hypertension Brother     Social History   Socioeconomic History   Marital status: Single    Spouse name: Not on file   Number of  children: Not on file   Years of education: Not on file   Highest education level: Not on file  Occupational History   Not on file  Tobacco Use   Smoking status: Never   Smokeless tobacco: Never  Vaping Use   Vaping Use: Never used  Substance and Sexual Activity   Alcohol use: Yes    Comment: daily beer and liquor/3 beers and 5 shots of liquor   Drug use: Never    Types: Marijuana    Comment: states not smoked in one year   Sexual activity: Not Currently  Other Topics Concern   Not on file  Social History Narrative   ** Merged History Encounter **       Lives in boarding house with friends   Social Determinants of Health   Financial Resource Strain: Not on file  Food Insecurity: Not on file  Transportation Needs: Not on file  Physical Activity: Not on file  Stress: Not on file  Social Connections: Not on file  Intimate Partner Violence: Not on file    Review of Systems  Constitutional:  Positive for fatigue.  Respiratory:  Positive for apnea and shortness of breath.   Psychiatric/Behavioral:  Positive for sleep disturbance.     Vitals:   05/10/22 0922  BP: 138/82  Pulse: 63  SpO2: 95%     Physical Exam Constitutional:      Appearance: She is obese.  HENT:     Head: Normocephalic.     Mouth/Throat:     Mouth: Mucous membranes are moist.     Comments: Mallampati 3, crowded oropharynx Eyes:     Pupils: Pupils are equal, round, and reactive to light.  Cardiovascular:     Rate and Rhythm: Normal rate and regular rhythm.     Heart sounds: No murmur heard.    No friction rub.  Pulmonary:     Effort: No respiratory distress.     Breath sounds: No stridor. No wheezing or rhonchi.  Musculoskeletal:     Cervical back: No rigidity or tenderness.  Neurological:     Mental Status: She is alert.  Psychiatric:        Mood and Affect: Mood normal.   Epworth Sleepiness Scale of 12  Data Reviewed: Previous sleep study from 2019 reviewed by myself showing she  was titrated to CPAP therapy Study showed AHI of over 32, titrated to CPAP of 17  CPAP compliance reviewed today showing 92% compliance AutoSet of 4-20 Residual AHI of 1.6  Assessment:  Severe obstructive sleep apnea Adequately treated with CPAP therapy -She is benefiting from CPAP therapy  Excessive daytime sleepiness related to obstructive sleep apnea -Symptoms are improved She is waking up feeling rested  Shortness of breath on exertion -This is multifactorial Deconditioning is likely playing a role  Does have a past history of smoking  She is benefiting from CPAP use  Plan/Recommendations: Continue using CPAP on a nightly basis Continues to benefit from CPAP  Weight loss efforts as tolerated  Follow-up in 6 months  Encouraged to call with significant concerns   Virl Diamond MD [facility] 05/10/2022, 9:30 AM  CC: Barnie Mort, PA-C

## 2022-05-10 NOTE — Patient Instructions (Signed)
Continue using the CPAP on a nightly basis  The download from the machine shows it is working well  Make sure you get enough hours of sleep at night Try and stay active during the day  I will see you in 6 months  Call us with any significant concerns

## 2022-07-09 NOTE — Progress Notes (Addendum)
Anesthesia Review:  PCP: Olive Bass clearance 06/14/22 on chart LOV 06/12/22 on chart  Cardiologist : none  Chest x-ray : EKG : 07/13/22  Echo : Stress test: Cardiac Cath :  Activity level: can do a flight of stairs without difficutly  Sleep Study/ CPAP : has cpap  Fasting Blood Sugar :      / Checks Blood Sugar -- times a day:   Blood Thinner/ Instructions /Last Dose: ASA / Instructions/ Last Dose :    Hgba1c- 06/12/22- 5.0 on chart    PT has hx of brain injury when in her 30s no surgery. NO surgery .  PT answers most questions without any difficutly .  A and  O  x3.    Sister with her at preop appt who helps her and she lives with sister.  Sister does not have HCPOA,  Paperwork given to pt in regards to Scientist, water quality and HCPOA.  Sister plans to have completed prior to surgery and will bring copy DOS.  PT has one daughter who she has not been in contact with in over 3 years.     Blood pressure was 155/103 at preop on 07/13/22.  Pt denies any chest pain, shortness of breath, dizziness, headache or blurred vision.  Sister monitors blood pressure at home daily. Sister to monitor blood pressure and to notify PCP if remains elevated.  Sister states at preop appt that blood pressure elevates when goes to MD appts.

## 2022-07-10 NOTE — Patient Instructions (Signed)
SURGICAL WAITING ROOM VISITATION  Patients having surgery or a procedure may have no more than 2 support people in the waiting area - these visitors may rotate.    Children under the age of 46 must have an adult with them who is not the patient.  Due to an increase in RSV and influenza rates and associated hospitalizations, children ages 44 and under may not visit patients in [facility] hospitals.  If the patient needs to stay at the hospital during part of their recovery, the visitor guidelines for inpatient rooms apply. Pre-op nurse will coordinate an appropriate time for 1 support person to accompany patient in pre-op.  This support person may not rotate.    Please refer to the [facility] website for the visitor guidelines for Inpatients (after your surgery is over and you are in a regular room).       Your procedure is scheduled on:  07/25/22    Report to [facility] Main Entrance    Report to admitting at   0645 AM   Call this number if you have problems the morning of surgery [phone]   Do not eat food :After Midnight.           Maay have liquids from 12 midnite until 0615 am of surgery.              Nothing after 0615am morning of surgery.    Water Non-Citrus Juices (without pulp, NO RED-Apple, White grape, White cranberry) Black Coffee (NO MILK/CREAM OR CREAMERS, sugar ok)  Clear Tea (NO MILK/CREAM OR CREAMERS, sugar ok) regular and decaf                             Plain Jell-O (NO RED)                                           Fruit ices (not with fruit pulp, NO RED)                                     Popsicles (NO RED)                                                               Sports drinks like Gatorade (NO RED)                          If you have questions, please contact your surgeon's office.       Oral Hygiene is also important to reduce your risk of infection.                                    Remember - BRUSH YOUR TEETH THE MORNING OF  SURGERY WITH YOUR REGULAR TOOTHPASTE  DENTURES WILL BE REMOVED PRIOR TO SURGERY PLEASE DO NOT APPLY "Poly grip" OR ADHESIVES!!!   Do NOT smoke after Midnight   Take these medicines the morning of surgery with A SIP  OF WATER:   DO NOT TAKE ANY ORAL DIABETIC MEDICATIONS DAY OF YOUR SURGERY  Bring CPAP mask and tubing day of surgery.                              You may not have any metal on your body including hair pins, jewelry, and body piercing             Do not wear make-up, lotions, powders, perfumes/cologne, or deodorant  Do not wear nail polish including gel and S&S, artificial/acrylic nails, or any other type of covering on natural nails including finger and toenails. If you have artificial nails, gel coating, etc. that needs to be removed by a nail salon please have this removed prior to surgery or surgery may need to be canceled/ delayed if the surgeon/ anesthesia feels like they are unable to be safely monitored.   Do not shave  48 hours prior to surgery.               Men may shave face and neck.   Do not bring valuables to the hospital. [facility] IS NOT             RESPONSIBLE   FOR VALUABLES.   Contacts, glasses, dentures or bridgework may not be worn into surgery.   Bring small overnight bag day of surgery.   DO NOT BRING YOUR HOME MEDICATIONS TO THE HOSPITAL. PHARMACY WILL DISPENSE MEDICATIONS LISTED ON YOUR MEDICATION LIST TO YOU DURING YOUR ADMISSION IN THE HOSPITAL!    Patients discharged on the day of surgery will not be allowed to drive home.  Someone NEEDS to stay with you for the first 24 hours after anesthesia.   Special Instructions: Bring a copy of your healthcare power of attorney and living will documents the day of surgery if you haven't scanned them before.              Please read over the following fact sheets you were given: IF YOU HAVE QUESTIONS ABOUT YOUR PRE-OP INSTRUCTIONS PLEASE CALL [phone]   If you received a COVID test during your  pre-op visit  it is requested that you wear a mask when out in public, stay away from anyone that may not be feeling well and notify your surgeon if you develop symptoms. If you test positive for Covid or have been in contact with anyone that has tested positive in the last 10 days please notify you surgeon.      Pre-operative 5 CHG Bath Instructions   You can play a key role in reducing the risk of infection after surgery. Your skin needs to be as free of germs as possible. You can reduce the number of germs on your skin by washing with CHG (chlorhexidine gluconate) soap before surgery. CHG is an antiseptic soap that kills germs and continues to kill germs even after washing.   DO NOT use if you have an allergy to chlorhexidine/CHG or antibacterial soaps. If your skin becomes reddened or irritated, stop using the CHG and notify one of our RNs at [phone].   Please shower with the CHG soap starting 4 days before surgery using the following schedule:     Please keep in mind the following:  DO NOT shave, including legs and underarms, starting the day of your first shower.   You may shave your face at any point before/day of surgery.  Place clean sheets on your  bed the day you start using CHG soap. Use a clean washcloth (not used since being washed) for each shower. DO NOT sleep with pets once you start using the CHG.   CHG Shower Instructions:  If you choose to wash your hair and private area, wash first with your normal shampoo/soap.  After you use shampoo/soap, rinse your hair and body thoroughly to remove shampoo/soap residue.  Turn the water OFF and apply about 3 tablespoons (45 ml) of CHG soap to a CLEAN washcloth.  Apply CHG soap ONLY FROM YOUR NECK DOWN TO YOUR TOES (washing for 3-5 minutes)  DO NOT use CHG soap on face, private areas, open wounds, or sores.  Pay special attention to the area where your surgery is being performed.  If you are having back surgery, having someone  wash your back for you may be helpful. Wait 2 minutes after CHG soap is applied, then you may rinse off the CHG soap.  Pat dry with a clean towel  Put on clean clothes/pajamas   If you choose to wear lotion, please use ONLY the CHG-compatible lotions on the back of this paper.     Additional instructions for the day of surgery: DO NOT APPLY any lotions, deodorants, cologne, or perfumes.   Put on clean/comfortable clothes.  Brush your teeth.  Ask your nurse before applying any prescription medications to the skin.      CHG Compatible Lotions   Aveeno Moisturizing lotion  Cetaphil Moisturizing Cream  Cetaphil Moisturizing Lotion  Clairol Herbal Essence Moisturizing Lotion, Dry Skin  Clairol Herbal Essence Moisturizing Lotion, Extra Dry Skin  Clairol Herbal Essence Moisturizing Lotion, Normal Skin  Curel Age Defying Therapeutic Moisturizing Lotion with Alpha Hydroxy  Curel Extreme Care Body Lotion  Curel Soothing Hands Moisturizing Hand Lotion  Curel Therapeutic Moisturizing Cream, Fragrance-Free  Curel Therapeutic Moisturizing Lotion, Fragrance-Free  Curel Therapeutic Moisturizing Lotion, Original Formula  Eucerin Daily Replenishing Lotion  Eucerin Dry Skin Therapy Plus Alpha Hydroxy Crme  Eucerin Dry Skin Therapy Plus Alpha Hydroxy Lotion  Eucerin Original Crme  Eucerin Original Lotion  Eucerin Plus Crme Eucerin Plus Lotion  Eucerin TriLipid Replenishing Lotion  Keri Anti-Bacterial Hand Lotion  Keri Deep Conditioning Original Lotion Dry Skin Formula Softly Scented  Keri Deep Conditioning Original Lotion, Fragrance Free Sensitive Skin Formula  Keri Lotion Fast Absorbing Fragrance Free Sensitive Skin Formula  Keri Lotion Fast Absorbing Softly Scented Dry Skin Formula  Keri Original Lotion  Keri Skin Renewal Lotion Keri Silky Smooth Lotion  Keri Silky Smooth Sensitive Skin Lotion  Nivea Body Creamy Conditioning Oil  Nivea Body Extra Enriched Lotion  Nivea Body Original  Lotion  Nivea Body Sheer Moisturizing Lotion Nivea Crme  Nivea Skin Firming Lotion  NutraDerm 30 Skin Lotion  NutraDerm Skin Lotion  NutraDerm Therapeutic Skin Cream  NutraDerm Therapeutic Skin Lotion  ProShield Protective Hand Cream  Provon moisturizing lotion   [place]- Preparing for Total Shoulder Arthroplasty    Before surgery, you can play an important role. Because skin is not sterile, your skin needs to be as free of germs as possible. You can reduce the number of germs on your skin by using the following products. Benzoyl Peroxide Gel Reduces the number of germs present on the skin Applied twice a day to shoulder area starting two days before surgery    ==================================================================  Please follow these instructions carefully:  BENZOYL PEROXIDE 5% GEL  Please do not use if you have an allergy to benzoyl peroxide.  If your skin becomes reddened/irritated stop using the benzoyl peroxide.  Starting two days before surgery, apply as follows: Apply benzoyl peroxide in the morning and at night. Apply after taking a shower. If you are not taking a shower clean entire shoulder front, back, and side along with the armpit with a clean wet washcloth.  Place a quarter-sized dollop on your shoulder and rub in thoroughly, making sure to cover the front, back, and side of your shoulder, along with the armpit.   2 days before ____ AM   ____ PM              1 day before ____ AM   ____ PM                         Do this twice a day for two days.  (Last application is the night before surgery, AFTER using the CHG soap as described below).  Do NOT apply benzoyl peroxide gel on the day of surgery.

## 2022-07-13 ENCOUNTER — Other Ambulatory Visit: Payer: Self-pay

## 2022-07-13 ENCOUNTER — Encounter (HOSPITAL_COMMUNITY)
Admission: RE | Admit: 2022-07-13 | Discharge: 2022-07-13 | Disposition: A | Discharge status: Home / Self Care | Payer: 59 | Source: Ambulatory Visit | Attending: Orthopaedic Surgery | Admitting: Orthopaedic Surgery

## 2022-07-13 ENCOUNTER — Encounter (HOSPITAL_COMMUNITY): Payer: Self-pay

## 2022-07-13 VITALS — BP 155/103 | HR 86 | Temp 97.7°F | Resp 16 | Ht 64.0 in | Wt 274.0 lb

## 2022-07-13 DIAGNOSIS — Z01818 Encounter for other preprocedural examination: Secondary | ICD-10-CM | POA: Diagnosis present

## 2022-07-13 HISTORY — DX: Sleep apnea, unspecified: G47.30

## 2022-07-13 HISTORY — DX: Dyspnea, unspecified: R06.00

## 2022-07-13 HISTORY — DX: Unspecified osteoarthritis, unspecified site: M19.90

## 2022-07-13 LAB — BASIC METABOLIC PANEL
Anion gap: 9 (ref 5–15)
BUN: 16 mg/dL (ref 6–20)
CO2: 23 mmol/L (ref 22–32)
Calcium: 9 mg/dL (ref 8.9–10.3)
Chloride: 106 mmol/L (ref 98–111)
Creatinine, Ser: 0.8 mg/dL (ref 0.44–1.00)
GFR, Estimated: >60 mL/min (ref >60)
Glucose, Bld: 89 mg/dL (ref 70–99)
Potassium: 3.7 mmol/L (ref 3.5–5.1)
Sodium: 138 mmol/L (ref 135–145)

## 2022-07-13 LAB — CBC
HCT: 42.7 % (ref 36.0–46.0)
Hemoglobin: 13.7 g/dL (ref 12.0–15.0)
MCH: 28 pg (ref 26.0–34.0)
MCHC: 32.1 g/dL (ref 30.0–36.0)
MCV: 87.3 fL (ref 80.0–100.0)
Platelets: 190 10*3/uL (ref 150–400)
RBC: 4.89 MIL/uL (ref 3.87–5.11)
RDW: 13.2 % (ref 11.5–15.5)
WBC: 8.4 10*3/uL (ref 4.0–10.5)
nRBC: 0 % (ref 0.0–0.2)

## 2022-07-13 LAB — SURGICAL PCR SCREEN
MRSA, PCR: NEGATIVE
Staphylococcus aureus: NEGATIVE

## 2022-07-23 ENCOUNTER — Ambulatory Visit (INDEPENDENT_AMBULATORY_CARE_PROVIDER_SITE_OTHER): Payer: Medicare Other

## 2022-07-23 ENCOUNTER — Ambulatory Visit (INDEPENDENT_AMBULATORY_CARE_PROVIDER_SITE_OTHER): Payer: 59 | Admitting: Podiatry

## 2022-07-23 DIAGNOSIS — M79675 Pain in left toe(s): Secondary | ICD-10-CM

## 2022-07-23 DIAGNOSIS — B351 Tinea unguium: Secondary | ICD-10-CM

## 2022-07-23 DIAGNOSIS — M2041 Other hammer toe(s) (acquired), right foot: Secondary | ICD-10-CM

## 2022-07-23 DIAGNOSIS — M79674 Pain in right toe(s): Secondary | ICD-10-CM | POA: Diagnosis not present

## 2022-07-23 DIAGNOSIS — M21619 Bunion of unspecified foot: Secondary | ICD-10-CM | POA: Diagnosis not present

## 2022-07-23 DIAGNOSIS — M21611 Bunion of right foot: Secondary | ICD-10-CM | POA: Diagnosis not present

## 2022-07-23 DIAGNOSIS — M21612 Bunion of left foot: Secondary | ICD-10-CM

## 2022-07-24 NOTE — H&P (Signed)
PREOPERATIVE H&P  Chief Complaint: lt shoulder DJD  HPI: Lauren Moore is a 62 y.o. female who is scheduled for Procedure(s): REVERSE SHOULDER ARTHROPLASTY.   Patient has a past medical history significant for HTN, sleep apnea.   Patient is a 62 year-old who has had a history of pulling tobacco in the field and said she hurt her shoulder a long time ago.  She uses a walker for balance regularly and daily and needs that to avoid falls.    Symptoms are rated as moderate to severe, and have been worsening.  This is significantly impairing activities of daily living.    Please see clinic note for further details on this patient's care.    She has elected for surgical management.   Past Medical History:  Diagnosis Date   Arthritis    Brain injury (HCC)    Dyspnea    Hypertension    Obesity    Sleep apnea    cpap   Past Surgical History:  Procedure Laterality Date   BRAIN SURGERY     COLONOSCOPY N/A 06/26/2012   Procedure: COLONOSCOPY;  Surgeon: Barrie Folk, MD;  Location: [facility];  Service: Endoscopy;  Laterality: N/A;   TYMPANOPLASTY Right 11/25/2017   Procedure: TYMPANOPLASTY;  Surgeon: Serena Colonel, MD;  Location: [facility];  Service: ENT;  Laterality: Right;   Social History   Socioeconomic History   Marital status: Single    Spouse name: Not on file   Number of children: Not on file   Years of education: Not on file   Highest education level: Not on file  Occupational History   Not on file  Tobacco Use   Smoking status: Never   Smokeless tobacco: Never  Vaping Use   Vaping Use: Never used  Substance and Sexual Activity   Alcohol use: Yes    Comment: 4-5 beers per week   Drug use: Never    Comment: states not smoked in one year   Sexual activity: Not Currently  Other Topics Concern   Not on file  Social History Narrative   ** Merged History Encounter **       Lives in boarding house with friends   Social Determinants of  Health   Financial Resource Strain: Not on file  Food Insecurity: Not on file  Transportation Needs: Not on file  Physical Activity: Not on file  Stress: Not on file  Social Connections: Not on file   Family History  Problem Relation Age of Onset   Cancer Sister        Lung   Hypertension Sister    Diabetes Sister    Cancer Brother        Lung   Stroke Brother    Hypertension Brother    No Known Allergies Prior to Admission medications   Medication Sig Start Date End Date Taking? Authorizing Provider  albuterol (VENTOLIN HFA) 108 (90 Base) MCG/ACT inhaler Inhale 1-2 puffs into the lungs every 6 (six) hours as needed for wheezing or shortness of breath. Patient taking differently: Inhale 1-2 puffs into the lungs 2 (two) times daily. 09/26/20  Yes Nche, Bonna Gains, NP    ROS: All other systems have been reviewed and were otherwise negative with the exception of those mentioned in the HPI and as above.  Physical Exam: General: Alert, no acute distress Cardiovascular: No pedal edema Respiratory: No cyanosis, no use of accessory musculature GI: No organomegaly, abdomen is soft and non-tender  Skin: No lesions in the area of chief complaint Neurologic: Sensation intact distally Psychiatric: Patient is competent for consent with normal mood and affect Lymphatic: No axillary or cervical lymphadenopathy  MUSCULOSKELETAL:  Range of motion of the shoulder is to about 90.  External rotation of 0.  Internal rotation to back pocket.  Weakness with cuff testing.  Imaging: Reviewed MRI which demonstrates a tear of the supraspinatus and infraspinatus.  Retraction to the mid humeral head.  Tissue quality looks delaminated and poor.  BMI: Estimated body mass index is 47.03 kg/m as calculated from the following:   Height as of 07/13/22: 5\' 4"  (1.626 m).   Weight as of 07/13/22: 124.3 kg.  Lab Results  Component Value Date   ALBUMIN 3.9 09/26/2020   Diabetes: Patient does not have a  diagnosis of diabetes.     Smoking Status:   reports that she has never smoked. She has never used smokeless tobacco.     Assessment: lt shoulder DJD  Plan: Plan for Procedure(s): REVERSE SHOULDER ARTHROPLASTY   The risks benefits and alternatives were discussed with the patient including but not limited to the risks of nonoperative treatment, versus surgical intervention including infection, bleeding, nerve injury,  blood clots, cardiopulmonary complications, morbidity, mortality, among others, and they were willing to proceed.   We additionally specifically discussed risks of axillary nerve injury, infection, periprosthetic fracture, continued pain and longevity of implants prior to beginning procedure.    Patient will be closely monitored in PACU for medical stabilization and pain control. If found stable in PACU, patient may be discharged home with outpatient follow-up. If any concerns regarding patient's stabilization patient will be admitted for observation after surgery. The patient is planning to be discharged home with outpatient PT.   The patient acknowledged the explanation, agreed to proceed with the plan and consent was signed.   She received clearance from her PCP, Warnell Forester PA-C.   Operative Plan: Left reverse total shoulder arthroplasty Discharge Medications: standard DVT Prophylaxis: aspirin Physical Therapy: outpatient PT Special Discharge needs: Sling. IceMan   Vernetta Honey, PA-C  07/24/2022 4:28 PM

## 2022-07-25 ENCOUNTER — Other Ambulatory Visit: Payer: Self-pay

## 2022-07-25 ENCOUNTER — Encounter (HOSPITAL_COMMUNITY)
Admission: RE | Disposition: A | Discharge status: Home / Self Care | Payer: Self-pay | Source: Ambulatory Visit | Attending: Orthopaedic Surgery

## 2022-07-25 ENCOUNTER — Encounter (HOSPITAL_COMMUNITY): Payer: Self-pay | Admitting: Orthopaedic Surgery

## 2022-07-25 ENCOUNTER — Observation Stay (HOSPITAL_COMMUNITY): Payer: 59

## 2022-07-25 ENCOUNTER — Ambulatory Visit (HOSPITAL_COMMUNITY): Payer: 59 | Admitting: Physician Assistant

## 2022-07-25 ENCOUNTER — Observation Stay (HOSPITAL_COMMUNITY)
Admission: RE | Admit: 2022-07-25 | Discharge: 2022-07-26 | Disposition: A | Discharge status: Home / Self Care | Payer: 59 | Source: Ambulatory Visit | Attending: Orthopaedic Surgery | Admitting: Orthopaedic Surgery

## 2022-07-25 ENCOUNTER — Ambulatory Visit (HOSPITAL_BASED_OUTPATIENT_CLINIC_OR_DEPARTMENT_OTHER): Payer: 59 | Admitting: Certified Registered Nurse Anesthetist

## 2022-07-25 DIAGNOSIS — M19012 Primary osteoarthritis, left shoulder: Secondary | ICD-10-CM | POA: Insufficient documentation

## 2022-07-25 DIAGNOSIS — I10 Essential (primary) hypertension: Secondary | ICD-10-CM | POA: Diagnosis not present

## 2022-07-25 DIAGNOSIS — M75102 Unspecified rotator cuff tear or rupture of left shoulder, not specified as traumatic: Secondary | ICD-10-CM | POA: Diagnosis not present

## 2022-07-25 DIAGNOSIS — Z79899 Other long term (current) drug therapy: Secondary | ICD-10-CM | POA: Insufficient documentation

## 2022-07-25 DIAGNOSIS — G4730 Sleep apnea, unspecified: Secondary | ICD-10-CM

## 2022-07-25 DIAGNOSIS — Z96612 Presence of left artificial shoulder joint: Secondary | ICD-10-CM

## 2022-07-25 DIAGNOSIS — Z6841 Body Mass Index (BMI) 40.0 and over, adult: Secondary | ICD-10-CM

## 2022-07-25 DIAGNOSIS — Z01818 Encounter for other preprocedural examination: Secondary | ICD-10-CM

## 2022-07-25 HISTORY — PX: REVERSE SHOULDER ARTHROPLASTY: SHX5054

## 2022-07-25 SURGERY — ARTHROPLASTY, SHOULDER, TOTAL, REVERSE
Anesthesia: General | Site: Shoulder | Laterality: Left

## 2022-07-25 MED ORDER — LIDOCAINE 2% (20 MG/ML) 5 ML SYRINGE
INTRAMUSCULAR | Status: DC | PRN
  Administered 2022-07-25: 100 mg via INTRAVENOUS

## 2022-07-25 MED ORDER — LACTATED RINGERS IV SOLN: INTRAVENOUS | Status: DC

## 2022-07-25 MED ORDER — ALUM & MAG HYDROXIDE-SIMETH 200-200-20 MG/5ML PO SUSP: 30.0000 mL | ORAL | Status: DC | PRN

## 2022-07-25 MED ORDER — ONDANSETRON HCL 4 MG/2ML IJ SOLN
INTRAMUSCULAR | Status: AC
  Filled 2022-07-25: qty 2

## 2022-07-25 MED ORDER — OXYCODONE HCL 5 MG PO TABS
10.0000 mg | ORAL_TABLET | ORAL | Status: DC | PRN
  Administered 2022-07-25: 10 mg via ORAL
  Filled 2022-07-25: qty 2

## 2022-07-25 MED ORDER — 0.9 % SODIUM CHLORIDE (POUR BTL) OPTIME
TOPICAL | Status: DC | PRN
  Administered 2022-07-25: 1000 mL

## 2022-07-25 MED ORDER — DIPHENHYDRAMINE HCL 12.5 MG/5ML PO ELIX: 12.5000 mg | ORAL_SOLUTION | ORAL | Status: DC | PRN

## 2022-07-25 MED ORDER — OXYCODONE HCL 5 MG PO TABS: 5.0000 mg | ORAL_TABLET | ORAL | Status: DC | PRN

## 2022-07-25 MED ORDER — TRANEXAMIC ACID-NACL 1000-0.7 MG/100ML-% IV SOLN
1000.0000 mg | INTRAVENOUS | Status: AC
  Administered 2022-07-25: 1000 mg via INTRAVENOUS
  Filled 2022-07-25: qty 100

## 2022-07-25 MED ORDER — CEFAZOLIN SODIUM-DEXTROSE 2-4 GM/100ML-% IV SOLN
2.0000 g | Freq: Four times a day (QID) | INTRAVENOUS | Status: AC
  Administered 2022-07-25 – 2022-07-26 (×3): 2 g via INTRAVENOUS
  Filled 2022-07-25 (×3): qty 100

## 2022-07-25 MED ORDER — ACETAMINOPHEN 500 MG PO TABS
1000.0000 mg | ORAL_TABLET | Freq: Once | ORAL | Status: AC
  Administered 2022-07-25: 1000 mg via ORAL
  Filled 2022-07-25: qty 2

## 2022-07-25 MED ORDER — OXYCODONE HCL 5 MG PO TABS: 5.0000 mg | ORAL_TABLET | Freq: Once | ORAL | Status: DC | PRN

## 2022-07-25 MED ORDER — SODIUM CHLORIDE 0.9 % IR SOLN
Status: DC | PRN
  Administered 2022-07-25: 1000 mL

## 2022-07-25 MED ORDER — BISACODYL 10 MG RE SUPP: 10.0000 mg | Freq: Every day | RECTAL | Status: DC | PRN

## 2022-07-25 MED ORDER — CHLORHEXIDINE GLUCONATE 0.12 % MT SOLN
15.0000 mL | Freq: Once | OROMUCOSAL | Status: AC
  Administered 2022-07-25: 15 mL via OROMUCOSAL

## 2022-07-25 MED ORDER — METHOCARBAMOL 500 MG PO TABS: 500.0000 mg | ORAL_TABLET | Freq: Four times a day (QID) | ORAL | Status: DC | PRN

## 2022-07-25 MED ORDER — BUPIVACAINE HCL (PF) 0.5 % IJ SOLN
INTRAMUSCULAR | Status: DC | PRN
  Administered 2022-07-25: 20 mL via PERINEURAL

## 2022-07-25 MED ORDER — HYDROMORPHONE HCL 1 MG/ML IJ SOLN
0.5000 mg | INTRAMUSCULAR | Status: DC | PRN
  Administered 2022-07-25: 0.5 mg via INTRAVENOUS
  Filled 2022-07-25: qty 0.5

## 2022-07-25 MED ORDER — POLYETHYLENE GLYCOL 3350 17 G PO PACK: 17.0000 g | PACK | Freq: Every day | ORAL | Status: DC | PRN

## 2022-07-25 MED ORDER — METOCLOPRAMIDE HCL 5 MG PO TABS: 5.0000 mg | ORAL_TABLET | Freq: Three times a day (TID) | ORAL | Status: DC | PRN

## 2022-07-25 MED ORDER — PHENOL 1.4 % MT LIQD: 1.0000 | OROMUCOSAL | Status: DC | PRN

## 2022-07-25 MED ORDER — BUPIVACAINE LIPOSOME 1.3 % IJ SUSP
INTRAMUSCULAR | Status: DC | PRN
  Administered 2022-07-25: 10 mL via PERINEURAL

## 2022-07-25 MED ORDER — SUGAMMADEX SODIUM 200 MG/2ML IV SOLN
INTRAVENOUS | Status: DC | PRN
  Administered 2022-07-25: 250 mg via INTRAVENOUS

## 2022-07-25 MED ORDER — PANTOPRAZOLE SODIUM 40 MG PO TBEC
40.0000 mg | DELAYED_RELEASE_TABLET | Freq: Every day | ORAL | Status: DC
  Administered 2022-07-25 – 2022-07-26 (×2): 40 mg via ORAL
  Filled 2022-07-25 (×2): qty 1

## 2022-07-25 MED ORDER — STERILE WATER FOR IRRIGATION IR SOLN
Status: DC | PRN
  Administered 2022-07-25: 2000 mL

## 2022-07-25 MED ORDER — MAGNESIUM CITRATE PO SOLN: 1.0000 | Freq: Once | ORAL | Status: DC | PRN

## 2022-07-25 MED ORDER — ORAL CARE MOUTH RINSE: 15.0000 mL | Freq: Once | OROMUCOSAL | Status: AC

## 2022-07-25 MED ORDER — ONDANSETRON HCL 4 MG/2ML IJ SOLN
INTRAMUSCULAR | Status: DC | PRN
  Administered 2022-07-25: 4 mg via INTRAVENOUS

## 2022-07-25 MED ORDER — PHENYLEPHRINE HCL-NACL 20-0.9 MG/250ML-% IV SOLN
INTRAVENOUS | Status: DC | PRN
  Administered 2022-07-25: 30 ug/min via INTRAVENOUS

## 2022-07-25 MED ORDER — FENTANYL CITRATE PF 50 MCG/ML IJ SOSY
50.0000 ug | PREFILLED_SYRINGE | INTRAMUSCULAR | Status: DC
  Administered 2022-07-25: 50 ug via INTRAVENOUS
  Filled 2022-07-25: qty 2

## 2022-07-25 MED ORDER — VANCOMYCIN HCL 1000 MG IV SOLR
INTRAVENOUS | Status: AC
  Filled 2022-07-25: qty 20

## 2022-07-25 MED ORDER — DEXAMETHASONE SODIUM PHOSPHATE 10 MG/ML IJ SOLN
INTRAMUSCULAR | Status: AC
  Filled 2022-07-25: qty 1

## 2022-07-25 MED ORDER — OXYCODONE HCL 5 MG/5ML PO SOLN: 5.0000 mg | Freq: Once | ORAL | Status: DC | PRN

## 2022-07-25 MED ORDER — PROPOFOL 10 MG/ML IV BOLUS
INTRAVENOUS | Status: AC
  Filled 2022-07-25: qty 20

## 2022-07-25 MED ORDER — FENTANYL CITRATE (PF) 100 MCG/2ML IJ SOLN
INTRAMUSCULAR | Status: DC | PRN
  Administered 2022-07-25: 50 ug via INTRAVENOUS

## 2022-07-25 MED ORDER — ONDANSETRON HCL 4 MG PO TABS: 4.0000 mg | ORAL_TABLET | Freq: Four times a day (QID) | ORAL | Status: DC | PRN

## 2022-07-25 MED ORDER — METHOCARBAMOL 1000 MG/10ML IJ SOLN: 500.0000 mg | Freq: Four times a day (QID) | INTRAVENOUS | Status: DC | PRN

## 2022-07-25 MED ORDER — ALBUTEROL SULFATE (2.5 MG/3ML) 0.083% IN NEBU: 2.5000 mg | INHALATION_SOLUTION | Freq: Four times a day (QID) | RESPIRATORY_TRACT | Status: DC | PRN

## 2022-07-25 MED ORDER — CELECOXIB 200 MG PO CAPS
200.0000 mg | ORAL_CAPSULE | Freq: Two times a day (BID) | ORAL | Status: DC
  Administered 2022-07-25 – 2022-07-26 (×3): 200 mg via ORAL
  Filled 2022-07-25 (×3): qty 1

## 2022-07-25 MED ORDER — DEXAMETHASONE SODIUM PHOSPHATE 4 MG/ML IJ SOLN
INTRAMUSCULAR | Status: DC | PRN
  Administered 2022-07-25: 5 mg via INTRAVENOUS

## 2022-07-25 MED ORDER — CEFAZOLIN IN SODIUM CHLORIDE 3-0.9 GM/100ML-% IV SOLN
3.0000 g | INTRAVENOUS | Status: AC
  Administered 2022-07-25: 3 g via INTRAVENOUS
  Filled 2022-07-25: qty 100

## 2022-07-25 MED ORDER — FENTANYL CITRATE (PF) 100 MCG/2ML IJ SOLN
INTRAMUSCULAR | Status: AC
  Filled 2022-07-25: qty 2

## 2022-07-25 MED ORDER — MIDAZOLAM HCL 2 MG/2ML IJ SOLN
1.0000 mg | INTRAMUSCULAR | Status: DC
  Administered 2022-07-25: 2 mg via INTRAVENOUS
  Filled 2022-07-25: qty 2

## 2022-07-25 MED ORDER — ACETAMINOPHEN 500 MG PO TABS
1000.0000 mg | ORAL_TABLET | Freq: Four times a day (QID) | ORAL | Status: AC
  Administered 2022-07-25 – 2022-07-26 (×4): 1000 mg via ORAL
  Filled 2022-07-25 (×4): qty 2

## 2022-07-25 MED ORDER — LIDOCAINE HCL (PF) 2 % IJ SOLN
INTRAMUSCULAR | Status: AC
  Filled 2022-07-25: qty 5

## 2022-07-25 MED ORDER — PROPOFOL 10 MG/ML IV BOLUS
INTRAVENOUS | Status: DC | PRN
  Administered 2022-07-25: 140 mg via INTRAVENOUS

## 2022-07-25 MED ORDER — FENTANYL CITRATE PF 50 MCG/ML IJ SOSY: 25.0000 ug | PREFILLED_SYRINGE | INTRAMUSCULAR | Status: DC | PRN

## 2022-07-25 MED ORDER — BUPIVACAINE HCL 0.25 % IJ SOLN
INTRAMUSCULAR | Status: AC
  Filled 2022-07-25: qty 1

## 2022-07-25 MED ORDER — DOCUSATE SODIUM 100 MG PO CAPS
100.0000 mg | ORAL_CAPSULE | Freq: Two times a day (BID) | ORAL | Status: DC
  Administered 2022-07-25: 100 mg via ORAL
  Filled 2022-07-25 (×3): qty 1

## 2022-07-25 MED ORDER — ONDANSETRON HCL 4 MG/2ML IJ SOLN: 4.0000 mg | Freq: Four times a day (QID) | INTRAMUSCULAR | Status: DC | PRN

## 2022-07-25 MED ORDER — ROCURONIUM BROMIDE 10 MG/ML (PF) SYRINGE
PREFILLED_SYRINGE | INTRAVENOUS | Status: AC
  Filled 2022-07-25: qty 10

## 2022-07-25 MED ORDER — ONDANSETRON HCL 4 MG/2ML IJ SOLN: 4.0000 mg | Freq: Once | INTRAMUSCULAR | Status: DC | PRN

## 2022-07-25 MED ORDER — ROCURONIUM BROMIDE 10 MG/ML (PF) SYRINGE
PREFILLED_SYRINGE | INTRAVENOUS | Status: DC | PRN
  Administered 2022-07-25: 70 mg via INTRAVENOUS

## 2022-07-25 MED ORDER — METOCLOPRAMIDE HCL 5 MG/ML IJ SOLN: 5.0000 mg | Freq: Three times a day (TID) | INTRAMUSCULAR | Status: DC | PRN

## 2022-07-25 MED ORDER — MENTHOL 3 MG MT LOZG: 1.0000 | LOZENGE | OROMUCOSAL | Status: DC | PRN

## 2022-07-25 MED ORDER — VANCOMYCIN HCL 1 G IV SOLR
INTRAVENOUS | Status: DC | PRN
  Administered 2022-07-25: 1000 mg via TOPICAL

## 2022-07-25 SURGICAL SUPPLY — 70 items
AID PSTN UNV HD RSTRNT DISP (MISCELLANEOUS) ×1
APL PRP STRL LF DISP 70% ISPRP (MISCELLANEOUS) ×2
AUG BASEPLATE 15DEG 25 WEDGE (Joint) ×1 IMPLANT
AUGMENT BASEPLATE 15DEG 25 WDG (Joint) IMPLANT
BAG COUNTER SPONGE SURGICOUNT (BAG) ×1 IMPLANT
BAG SPNG CNTER NS LX DISP (BAG) ×1
BIT DRILL 3.2 PERIPHERAL SCREW (BIT) IMPLANT
BLADE SAW SGTL 73X25 THK (BLADE) ×1 IMPLANT
BSPLAT GLND 15D 25 FULL WDG (Joint) ×1 IMPLANT
CHLORAPREP W/TINT 26 (MISCELLANEOUS) ×2 IMPLANT
CLSR STERI-STRIP ANTIMIC 1/2X4 (GAUZE/BANDAGES/DRESSINGS) ×1 IMPLANT
COOLER ICEMAN CLASSIC (MISCELLANEOUS) IMPLANT
COVER BACK TABLE 60X90IN (DRAPES) ×1 IMPLANT
COVER SURGICAL LIGHT HANDLE (MISCELLANEOUS) ×1 IMPLANT
DRAPE C-ARM 42X120 X-RAY (DRAPES) IMPLANT
DRAPE INCISE IOBAN 66X45 STRL (DRAPES) ×1 IMPLANT
DRAPE ORTHO SPLIT 77X108 STRL (DRAPES) ×2
DRAPE POUCH INSTRU U-SHP 10X18 (DRAPES) ×1 IMPLANT
DRAPE SHEET LG 3/4 BI-LAMINATE (DRAPES) ×2 IMPLANT
DRAPE SURG ORHT 6 SPLT 77X108 (DRAPES) ×2 IMPLANT
DRESSING AQUACEL AG SP 3.5X10 (GAUZE/BANDAGES/DRESSINGS) IMPLANT
DRSG AQUACEL AG ADV 3.5X 6 (GAUZE/BANDAGES/DRESSINGS) ×1 IMPLANT
DRSG AQUACEL AG SP 3.5X10 (GAUZE/BANDAGES/DRESSINGS) ×1
ELECT BLADE TIP CTD 4 INCH (ELECTRODE) ×1 IMPLANT
ELECT PENCIL ROCKER SW 15FT (MISCELLANEOUS) IMPLANT
ELECT REM PT RETURN 15FT ADLT (MISCELLANEOUS) ×1 IMPLANT
FACESHIELD WRAPAROUND (MASK) ×2 IMPLANT
FACESHIELD WRAPAROUND OR TEAM (MASK) ×2 IMPLANT
GAUZE XEROFORM 1X8 LF (GAUZE/BANDAGES/DRESSINGS) IMPLANT
GLENOSPHERE STANDARD 39 (Joint) ×1 IMPLANT
GLENOSPHERE STD 39 (Joint) IMPLANT
GLOVE BIO SURGEON STRL SZ 6.5 (GLOVE) ×2 IMPLANT
GLOVE BIOGEL PI IND STRL 6.5 (GLOVE) ×1 IMPLANT
GLOVE BIOGEL PI IND STRL 8 (GLOVE) ×1 IMPLANT
GLOVE ECLIPSE 8.0 STRL XLNG CF (GLOVE) ×2 IMPLANT
GOWN STRL REUS W/ TWL LRG LVL3 (GOWN DISPOSABLE) ×2 IMPLANT
GOWN STRL REUS W/TWL LRG LVL3 (GOWN DISPOSABLE) ×2
GUIDEWIRE GLENOID 2.5X220 (WIRE) IMPLANT
HANDPIECE INTERPULSE COAX TIP (DISPOSABLE) ×1
IMPL REVERSE SHOULDER 0X3.5 (Shoulder) IMPLANT
IMPLANT REVERSE SHOULDER 0X3.5 (Shoulder) ×1 IMPLANT
INSERT REV KIT SHOULDER 6X39 (Screw) IMPLANT
KIT BASIN OR (CUSTOM PROCEDURE TRAY) ×1 IMPLANT
KIT STABILIZATION SHOULDER (MISCELLANEOUS) ×1 IMPLANT
KIT TURNOVER KIT A (KITS) IMPLANT
MANIFOLD NEPTUNE II (INSTRUMENTS) ×1 IMPLANT
NS IRRIG 1000ML POUR BTL (IV SOLUTION) ×1 IMPLANT
PACK SHOULDER (CUSTOM PROCEDURE TRAY) ×1 IMPLANT
PAD COLD SHLDR WRAP-ON (PAD) IMPLANT
RESTRAINT HEAD UNIVERSAL NS (MISCELLANEOUS) ×1 IMPLANT
SCREW 5.5X22 (Screw) IMPLANT
SCREW BONE THREAD 6.5X35 (Screw) IMPLANT
SCREW PERIPHERAL 30 (Screw) IMPLANT
SET HNDPC FAN SPRY TIP SCT (DISPOSABLE) ×1 IMPLANT
SLING ARM IMMOBILIZER LRG (SOFTGOODS) IMPLANT
SPONGE T-LAP 4X18 ~~LOC~~+RFID (SPONGE) ×1 IMPLANT
STEM LONG PTC HUMERALTI SZ 2B (Stem) IMPLANT
SUCTION TUBE FRAZIER 12FR DISP (SUCTIONS) IMPLANT
SUT ETHIBOND 2 V 37 (SUTURE) ×1 IMPLANT
SUT ETHIBOND NAB CT1 #1 30IN (SUTURE) ×1 IMPLANT
SUT ETHILON 3 0 PS 1 (SUTURE) IMPLANT
SUT FIBERWIRE #5 38 CONV NDL (SUTURE) ×2
SUT MNCRL AB 4-0 PS2 18 (SUTURE) ×1 IMPLANT
SUT VIC AB 0 CT1 36 (SUTURE) IMPLANT
SUT VIC AB 3-0 SH 27 (SUTURE) ×1
SUT VIC AB 3-0 SH 27X BRD (SUTURE) ×1 IMPLANT
SUTURE FIBERWR #5 38 CONV NDL (SUTURE) ×2 IMPLANT
TOWEL OR 17X26 10 PK STRL BLUE (TOWEL DISPOSABLE) ×1 IMPLANT
TUBE SUCTION HIGH CAP CLEAR NV (SUCTIONS) ×1 IMPLANT
WATER STERILE IRR 1000ML POUR (IV SOLUTION) ×2 IMPLANT

## 2022-07-25 NOTE — Plan of Care (Signed)
  Problem: Education: Goal: Knowledge of the prescribed therapeutic regimen will improve Outcome: Progressing Goal: Understanding of activity limitations/precautions following surgery will improve Outcome: Progressing   Problem: Nutrition: Goal: Adequate nutrition will be maintained Outcome: Progressing   Problem: Pain Management: Goal: Pain level will decrease with appropriate interventions Outcome: Adequate for Discharge

## 2022-07-25 NOTE — Op Note (Signed)
Orthopaedic Surgery Operative Note (CSN: [id number])  Lauren Moore  Jun 04, 1960 Date of Surgery: 07/25/2022   Diagnoses:  Left irreparable cuff tear  Procedure: Left reverse  Total Shoulder Arthroplasty   Operative Finding Successful completion of planned procedure.  Patient's overall health, body habitus and cuff pathology meant that performing a rotator cuff repair while she is walker dependent would likely provide her very little overall mobility.  We felt that reverse arthroplasty was in her benefit considering this.    the final implants and relatively tight, we were stable and tight enough that we did not feel that a retentive liner was necessary.  Though the implant was relatively difficult to reduce it moved with minimal tension and the conjoined tendon was appropriately loose.  This was all done secondary to patient's early walker use and her body habitus with a BMI nearing 50 she is at high risk for dislocation.  Post-operative plan: The patient will be NWB in sling until her block wears off and then can use her arm for weightbearing activities using a walker as well as ADLs in front of her body, no internal rotation behind the waist for 3 months however.  The patient will be will be admitted to observation due to medical complexity, monitoring and pain management.  DVT prophylaxis Aspirin 81 mg twice daily for 6 weeks.  Pain control with PRN pain medication preferring oral medicines.  Follow up plan will be scheduled in approximately 7 days for incision check and XR.  Physical therapy to start immediately.  Implants: Tornier perform humeral long size 2 stem, 0 high offset tray with a +6 polyethylene, 39 standard glenosphere with a full wedge 25 baseplate and a 35 center screw with 4 peripheral screws  Post-Op Diagnosis: Same Surgeons:Primary: Bjorn Pippin, MD Assistants:Caroline McBane PA-C Location: [facility] ROOM 06 Anesthesia: General with Exparel Interscalene Antibiotics:  Ancef 1g preop, Vancomycin 1000mg  locally Tourniquet time: None Estimated Blood Loss: 100 Complications: None Specimens: None Implants: Implant Name Type Inv. Item Serial No. Manufacturer Lot No. LRB No. Used Action  AUG BASEPLATE 15DEG 25 WEDGE - [id number] Joint AUG BASEPLATE 15DEG 25 WEDGE [id number] [employer]  Left 1 Implanted  GLENOSPHERE STANDARD 39 - [id number] Joint GLENOSPHERE STANDARD 39 [id number] [employer]  Left 1 Implanted  SCREW BONE THREAD 6.5X35 - [id number] Screw SCREW BONE THREAD 6.5X35  [employer]  Left 1 Implanted  SCREW 5.5X22 - [id number] Screw SCREW 5.5X22  [employer]  Left 3 Implanted  SCREW PERIPHERAL 30 - [id number] Screw SCREW PERIPHERAL 30  [employer]  Left 1 Implanted  STEM LONG PTC HUMERALTI SZ 2B - [id number] Stem STEM LONG PTC HUMERALTI SZ 2B [id number] [employer]  Left 1 Implanted  IMPLANT REVERSE SHOULDER 0X3.5 - [id number] Shoulder IMPLANT REVERSE SHOULDER 0X3.5 [id number] [employer]  Left 1 Implanted  INSERT REV KIT SHOULDER 6X39 - [id number] Screw INSERT REV KIT SHOULDER 6X39 [id number] [employer]  Left 1 Implanted    Indications for Surgery:   Lauren Moore is a 62 y.o. female with irreparable cuff tear and walker dependence.  Benefits and risks of operative and nonoperative management were discussed prior to surgery with patient/guardian(s) and informed consent form was completed.  Infection and need for further surgery were discussed as was prosthetic stability and cuff issues.  We additionally specifically discussed risks of axillary nerve injury, infection, periprosthetic fracture, continued pain and longevity of implants prior to beginning procedure.      Procedure:   The patient was  identified in the preoperative holding area where the surgical site was marked. Block placed by anesthesia with exparel.  The patient was taken to the OR where a procedural timeout was called and the above noted anesthesia was induced.  The patient  was positioned beachchair on allen table with spider arm positioner.  Preoperative antibiotics were dosed.  The patient's left shoulder was prepped and draped in the usual sterile fashion.  A second preoperative timeout was called.       Standard deltopectoral approach was performed with a #10 blade. We dissected down to the subcutaneous tissues and the cephalic vein was taken laterally with the deltoid. Clavipectoral fascia was incised in line with the incision. Deep retractors were placed. The long of the biceps tendon was identified and there was significant tenosynovitis present.  Tenodesis was performed to the pectoralis tendon with #2 Ethibond. The remaining biceps was followed up into the rotator interval where it was released.   The subscapularis was taken down in a full thickness layer with capsule along the humeral neck extending inferiorly around the humeral head. We continued releasing the capsule directly off of the osteophytes inferiorly all the way around the corner. This allowed [place] to dislocate the humeral head.   The rotator cuff was carefully examined and noted to be irreperably torn.  The decision was confirmed that a reverse total shoulder was indicated for this patient.  There were osteophytes along the inferior humeral neck. The osteophytes were removed with an osteotome and a rongeur.  Osteophytes were removed with a rongeur and an osteotome and the anatomic neck was well visualized.     A humeral cutting guide was inserted down the intramedullary canal. The version was set at 20 of retroversion. Humeral osteotomy was performed with an oscillating saw. The head fragment was passed off the back table. A starter awl was used to open the humeral canal. We next used T-handle straight sound reamers to ream up to an appropriate fit. A chisel was used to remove proximal humeral bone. We then broached starting with a size one broach and broaching up to 2 which obtained an appropriate fit.  The broach handle was removed. A cut protector was placed. The broach handle was removed and a cut protector was placed. The humerus was retracted posteriorly and we turned our attention to glenoid exposure.  The subscapularis was again identified and immediately we took care to palpate the axillary nerve anteriorly and verify its position with gentle palpation as well as the tug test.  We then released the SGHL with bovie cautery prior to placing a curved mayo at the junction of the anterior glenoid well above the axillary nerve and bluntly dissecting the subscapularis from the capsule.  We then carefully protected the axillary nerve as we gently released the inferior capsule to fully mobilize the subscapularis.  An anterior deltoid retractor was then placed as well as a small Hohmann retractor superiorly.   The glenoid was relatively intact in the setting of an irreparable cuff tear  The remaining labrum was removed circumferentially taking great care not to disrupt the posterior capsule.   At this point we felt based on blueprint templating that a full wedge augment was necessary.  We began by using a full wedge guide to place our center pin as was templated.  We had good position of this pin and we proceeded with our starter center drill.  This allowed for [place] to use the 15 degree full wedge reamer obtaining  circumferential witness marks and good bone preparation for ingrowth.  At this point we proceeded with our center drill and had an intact vault.  We then drilled our center screw to a length of 35 mm.    We selected a 6.5 mm x 35 mm screw and the full wedge baseplate which was placed in the same orientation as our reaming.  We double checked that we had good apposition of the base plate to bone and then proceeded to place 3 locking screws and one nonlocking screw as is typical.   We turned attention back to the humeral side. The cut protector was removed. We trialed with multiple size tray and  polyethylene options and selected a 6 which provided good stability and range of motion without excess soft tissue tension. The offset was dialed in to match the normal anatomy. The shoulder was trialed.  There was good ROM in all planes and the shoulder was stable with no inferior translation.  The real humeral implants were opened after again confirming sizes.  The trial was removed. #5 Fiberwire x4 sutures passed through the humeral neck for subscap repair. The humeral component was press-fit obtaining a secure fit. A +0 high offset tray was selected and impacted onto the stem.  A 39+6 polyethylene liner was impacted onto the stem.  The joint was reduced and thoroughly irrigated with pulsatile lavage. Subscap was repaired back with #5 Fiberwire sutures through bone tunnels. Hemostasis was obtained. The deltopectoral interval was reapproximated with #1 Ethibond. The subcutaneous tissues were closed with 2-0 Vicryl and the skin was closed with running monocryl.    The wounds were cleaned and dried and an Aquacel dressing was placed. The drapes taken down. The arm was placed into sling with abduction pillow. Patient was awakened, extubated, and transferred to the recovery room in stable condition. There were no intraoperative complications. The sponge, needle, and attention counts were  correct at the end of the case.       Alfonse Alpers, PA-C, present and scrubbed throughout the case, critical for completion in a timely fashion, and for retraction, instrumentation, closure.

## 2022-07-25 NOTE — Progress Notes (Signed)
Subjective:   Patient ID: Lauren Moore, female   DOB: 62 y.o.   MRN: [id number]   HPI Patient presents with caregiver with several problems with 1 being thickness and damage to all the toenails that they cannot cut with obesity is complicating factor and also keratotic lesion formation distal third digit bilateral that is painful when pressed with walking.  Mild structural bunion deformity that she is concerned about also.  Patient does not smoke is not active   Review of Systems  All other systems reviewed and are negative.       Objective:  Physical Exam Vitals and nursing note reviewed.  Constitutional:      Appearance: She is well-developed.  Pulmonary:     Effort: Pulmonary effort is normal.  Musculoskeletal:        General: Normal range of motion.  Skin:    General: Skin is warm.  Neurological:     Mental Status: She is alert.     Neurovascular status intact muscle strength found to be adequate range of motion moderately reduced.  Patient is found to have elongated nailbeds 1-5 both feet that are thick dystrophic and can become painful with pressure and is noted to have digital deformity with keratotic lesion distal aspect toes with structural changes to the toes themselves.     Assessment:  Poor generalized foot health with patient who does have obesity and other generalized health issues     Plan:  H&P x-rays reviewed discussed at great length do not recommend anything aggressive at this time due to overall health history and I went ahead and I debrided nailbeds 1-5 both feet I debrided lesions no iatrogenic bleeding was noted patient will be seen back for routine care  X-rays do indicate that there is mild bunion deformity bilateral and there is digital contracture with mild arthritis and depression of the arch.  No other pathology noted

## 2022-07-25 NOTE — Transfer of Care (Signed)
Immediate Anesthesia Transfer of Care Note  Patient: Lauren Moore  Procedure(s) Performed: LEFT REVERSE SHOULDER ARTHROPLASTY (Left: Shoulder)  Patient Location: PACU  Anesthesia Type:GA combined with regional for post-op pain  Level of Consciousness: awake and patient cooperative  Airway & Oxygen Therapy: Patient Spontanous Breathing and Patient connected to face mask  Post-op Assessment: Report given to RN and Post -op Vital signs reviewed and stable  Post vital signs: Reviewed and stable  Last Vitals:  Vitals Value Taken Time  BP    Temp    Pulse    Resp    SpO2      Last Pain:  Vitals:   07/25/22 0714  TempSrc:   PainSc: 0-No pain      Patients Stated Pain Goal: 4 (07/25/22 0714)  Complications: No notable events documented.

## 2022-07-25 NOTE — Interval H&P Note (Signed)
All questions answered, patient wants to proceed with procedure. ? ?

## 2022-07-25 NOTE — Anesthesia Procedure Notes (Signed)
Anesthesia Regional Block: Interscalene brachial plexus block   Pre-Anesthetic Checklist: , timeout performed,  Correct Patient, Correct Site, Correct Laterality,  Correct Procedure, Correct Position, site marked,  Risks and benefits discussed,  Surgical consent,  Pre-op evaluation,  At surgeon's request and post-op pain management  Laterality: Left  Prep: chloraprep       Needles:  Injection technique: Single-shot  Needle Type: Echogenic Stimulator Needle     Needle Length: 9cm      Additional Needles:   Procedures:,,,, ultrasound used (permanent image in chart),,     Nerve Stimulator or Paresthesia:  Response: 0.5 mA  Additional Responses:   Narrative:  Start time: 07/25/2022 8:30 AM End time: 07/25/2022 8:40 AM Injection made incrementally with aspirations every 5 mL.  Performed by: Personally  Anesthesiologist: Eilene Ghazi, MD  Additional Notes: Patient tolerated the procedure well without complications

## 2022-07-25 NOTE — Anesthesia Postprocedure Evaluation (Signed)
Anesthesia Post Note  Patient: Lauren Moore  Procedure(s) Performed: LEFT REVERSE SHOULDER ARTHROPLASTY (Left: Shoulder)     Patient location during evaluation: PACU Anesthesia Type: General Level of consciousness: awake and alert Pain management: pain level controlled Vital Signs Assessment: post-procedure vital signs reviewed and stable Respiratory status: spontaneous breathing, nonlabored ventilation, respiratory function stable and patient connected to nasal cannula oxygen Cardiovascular status: blood pressure returned to baseline and stable Postop Assessment: no apparent nausea or vomiting Anesthetic complications: no  No notable events documented.  Last Vitals:  Vitals:   07/25/22 1200 07/25/22 1215  BP: (!) 149/97 (!) 152/99  Pulse: 63 63  Resp: 18 19  Temp:  36.7 C  SpO2: 94% 96%    Last Pain:  Vitals:   07/25/22 1215  TempSrc:   PainSc: Asleep                 Naw Lasala S

## 2022-07-25 NOTE — Discharge Instructions (Signed)
Ramond Marrow MD, MPH Alfonse Alpers, PA-C [facility] [address] [phone] (tel)   [phone] (fax)   POST-OPERATIVE INSTRUCTIONS - TOTAL SHOULDER REPLACEMENT    WOUND CARE You may leave the operative dressing in place until your follow-up appointment. KEEP THE INCISIONS CLEAN AND DRY. There may be a small amount of fluid/bleeding leaking at the surgical site. This is normal after surgery.  If it fills with liquid or blood please call us immediately to change it for you. Use the provided ice machine or Ice packs as often as possible for the first 3-4 days, then as needed for pain relief.   Keep a layer of cloth or a shirt between your skin and the cooling unit to prevent frost bite as it can get very cold.  SHOWERING: - You may shower on Post-Op Day #2.  - The dressing is water resistant but do not scrub it as it may start to peel up.   - You may remove the sling for showering - Gently pat the area dry.  - Do not soak the shoulder in water.  - Do not go swimming in the pool or ocean until your incision has completely healed (about 4-6 weeks after surgery) - KEEP THE INCISIONS CLEAN AND DRY.  EXERCISES Wear the sling at all times  You may remove the sling for showering, but keep the arm across the chest or in a secondary sling.    Accidental/Purposeful External Rotation and shoulder flexion (reaching behind you) is to be avoided at all costs for the first month. It is ok to come out of your sling if your are sitting and have assistance for eating.   Do not lift anything heavier than 1 pound until we discuss it further in clinic.  It is normal for your fingers/hand to become more swollen after surgery and discolored from bruising.   This will resolve over the first few weeks usually after surgery. Please continue to ambulate and do not stay sitting or lying for too long.  Perform foot and wrist pumps to assist in circulation.  PHYSICAL  THERAPY - You will begin physical therapy soon after surgery (unless otherwise specified) - Please call to set up an appointment, if you do not already have one  - Let our office if there are any issues with scheduling your therapy  - You have a physical therapy appointment scheduled at [facility] (across the hall from our office) on June 28th   REGIONAL ANESTHESIA (NERVE BLOCKS) The anesthesia team may have performed a nerve block for you this is a great tool used to minimize pain.   The block may start wearing off overnight (between 8-24 hours postop) When the block wears off, your pain may go from nearly zero to the pain you would have had postop without the block. This is an abrupt transition but nothing dangerous is happening.   This can be a challenging period but utilize your as needed pain medications to try and manage this period. We suggest you use the pain medication the first night prior to going to bed, to ease this transition.  You may take an extra dose of narcotic when this happens if needed   POST-OP MEDICATIONS- Multimodal approach to pain control In general your pain will be controlled with a combination of substances.  Prescriptions unless otherwise discussed are electronically sent to your pharmacy.  This is a carefully made plan we use to minimize narcotic use.  Celebrex - Anti-inflammatory medication taken on a scheduled basis Acetaminophen - Non-narcotic pain medicine taken on a scheduled basis  Oxycodone - This is a strong narcotic, to be used only on an "as needed" basis for SEVERE pain. Aspirin 81mg  - This medicine is used to minimize the risk of blood clots after surgery. Omeprazole - daily medicine to protect your stomach while taking anti-inflammatories.  Zofran -  take as needed for nausea   FOLLOW-UP If you develop a Fever (>101.5), Redness or Drainage from the surgical incision site, please call our office to arrange for an evaluation. Please call the  office to schedule a follow-up appointment for a wound check, 7-10 days post-operatively.  IF YOU HAVE ANY QUESTIONS, PLEASE FEEL FREE TO CALL OUR OFFICE.  HELPFUL INFORMATION  Your arm will be in a sling following surgery. You will be in this sling for the next 4 weeks.   You may be more comfortable sleeping in a semi-seated position the first few nights following surgery.  Keep a pillow propped under the elbow and forearm for comfort.  If you have a recliner type of chair it might be beneficial.  If not that is fine too, but it would be helpful to sleep propped up with pillows behind your operated shoulder as well under your elbow and forearm.  This will reduce pulling on the suture lines.  When dressing, put your operative arm in the sleeve first.  When getting undressed, take your operative arm out last.  Loose fitting, button-down shirts are recommended.  In most states it is against the law to drive while your arm is in a sling. And certainly against the law to drive while taking narcotics.  You may return to work/school in the next couple of days when you feel up to it. Desk work and typing in the sling is fine.  We suggest you use the pain medication the first night prior to going to bed, in order to ease any pain when the anesthesia wears off. You should avoid taking pain medications on an empty stomach as it will make you nauseous.  You should wean off your narcotic medicines as soon as you are able.     Most patients will be off or using minimal narcotics before their first postop appointment.   Do not drink alcoholic beverages or take illicit drugs when taking pain medications.  Pain medication may make you constipated.  Below are a few solutions to try in this order: Decrease the amount of pain medication if you aren't having pain. Drink lots of decaffeinated fluids. Drink prune juice and/or each dried prunes  If the first 3 don't work start with additional solutions Take  Colace - an over-the-counter stool softener Take Senokot - an over-the-counter laxative Take Miralax - a stronger over-the-counter laxative   Dental Antibiotics:  In most cases prophylactic antibiotics for Dental procdeures after total joint surgery are not necessary.  Exceptions are as follows:  1. History of prior total joint infection  2. Severely immunocompromised (Organ Transplant, cancer chemotherapy, Rheumatoid biologic meds such as Humera)  3. Poorly controlled diabetes (A1C &gt; 8.0, blood glucose over 200)  If you have one of these conditions, contact your surgeon for an antibiotic prescription, prior to your dental procedure.   For more information including helpful videos and documents visit our website:   [email or web address]

## 2022-07-25 NOTE — Anesthesia Procedure Notes (Signed)
Procedure Name: Intubation Date/Time: 07/25/2022 9:35 AM  Performed by: Vanessa [place], CRNAPre-anesthesia Checklist: Patient identified, Emergency Drugs available, Suction available and Patient being monitored Patient Re-evaluated:Patient Re-evaluated prior to induction Oxygen Delivery Method: Circle system utilized Preoxygenation: Pre-oxygenation with 100% oxygen Induction Type: IV induction Ventilation: Mask ventilation without difficulty Laryngoscope Size: 2 and Miller Grade View: Grade I Tube type: Oral Tube size: 7.0 mm Number of attempts: 1 Airway Equipment and Method: Stylet Placement Confirmation: ETT inserted through vocal cords under direct vision, positive ETCO2 and breath sounds checked- equal and bilateral Secured at: 21 cm Tube secured with: Tape Dental Injury: Teeth and Oropharynx as per pre-operative assessment

## 2022-07-25 NOTE — Anesthesia Preprocedure Evaluation (Signed)
Anesthesia Evaluation  Patient identified by MRN, date of birth, ID band Patient awake    Reviewed: Allergy & Precautions, H&P , NPO status , Patient's Chart, lab work & pertinent test results  Airway Mallampati: II  TM Distance: >3 FB Neck ROM: Full    Dental no notable dental hx.    Pulmonary sleep apnea    Pulmonary exam normal breath sounds clear to auscultation       Cardiovascular hypertension, Normal cardiovascular exam Rhythm:Regular Rate:Normal     Neuro/Psych negative neurological ROS  negative psych ROS   GI/Hepatic negative GI ROS, Neg liver ROS,,,  Endo/Other    Morbid obesity  Renal/GU negative Renal ROS  negative genitourinary   Musculoskeletal negative musculoskeletal ROS (+)    Abdominal   Peds negative pediatric ROS (+)  Hematology negative hematology ROS (+)   Anesthesia Other Findings   Reproductive/Obstetrics negative OB ROS                             Anesthesia Physical Anesthesia Plan  ASA: 3  Anesthesia Plan: General   Post-op Pain Management: Regional block*   Induction: Intravenous  PONV Risk Score and Plan: 3 and Ondansetron, Dexamethasone, Midazolam and Treatment may vary due to age or medical condition  Airway Management Planned: Oral ETT  Additional Equipment:   Intra-op Plan:   Post-operative Plan: Extubation in OR  Informed Consent: I have reviewed the patients History and Physical, chart, labs and discussed the procedure including the risks, benefits and alternatives for the proposed anesthesia with the patient or authorized representative who has indicated his/her understanding and acceptance.     Dental advisory given  Plan Discussed with: CRNA and Surgeon  Anesthesia Plan Comments:        Anesthesia Quick Evaluation

## 2022-07-25 NOTE — Plan of Care (Signed)
  Problem: Activity: Goal: Ability to tolerate increased activity will improve Outcome: Progressing   Problem: Pain Management: Goal: Pain level will decrease with appropriate interventions Outcome: Progressing   Problem: Nutrition: Goal: Adequate nutrition will be maintained Outcome: Progressing   

## 2022-07-26 ENCOUNTER — Encounter (HOSPITAL_COMMUNITY): Payer: Self-pay | Admitting: Orthopaedic Surgery

## 2022-07-26 DIAGNOSIS — M75102 Unspecified rotator cuff tear or rupture of left shoulder, not specified as traumatic: Secondary | ICD-10-CM | POA: Diagnosis not present

## 2022-07-26 MED ORDER — OXYCODONE HCL 5 MG PO TABS: ORAL_TABLET | ORAL | 0 refills | Status: AC

## 2022-07-26 MED ORDER — ASPIRIN 81 MG PO CHEW: 81.0000 mg | CHEWABLE_TABLET | Freq: Two times a day (BID) | ORAL | 0 refills | Status: AC

## 2022-07-26 MED ORDER — OMEPRAZOLE 20 MG PO CPDR: 20.0000 mg | DELAYED_RELEASE_CAPSULE | Freq: Every day | ORAL | 0 refills | Status: AC

## 2022-07-26 MED ORDER — ONDANSETRON HCL 4 MG PO TABS: 4.0000 mg | ORAL_TABLET | Freq: Three times a day (TID) | ORAL | 0 refills | Status: AC | PRN

## 2022-07-26 MED ORDER — CELECOXIB 100 MG PO CAPS: 100.0000 mg | ORAL_CAPSULE | Freq: Two times a day (BID) | ORAL | 0 refills | Status: AC

## 2022-07-26 MED ORDER — ACETAMINOPHEN 500 MG PO TABS: 1000.0000 mg | ORAL_TABLET | Freq: Three times a day (TID) | ORAL | 0 refills | Status: AC

## 2022-07-26 NOTE — Progress Notes (Signed)
   ORTHOPAEDIC PROGRESS NOTE  s/p Procedure(s): LEFT REVERSE SHOULDER ARTHROPLASTY  SUBJECTIVE: Reports mild pain about operative site. Block is still working.  No chest pain. No SOB. No nausea/vomiting. No other complaints.  OBJECTIVE: PE: General: resting in hospital bed, NAD Cardiac: regular rate Pulmonary: no increased work of breathing LUE: Dressing CDI and sling well fitting,  full and painless ROM throughout hand with DPC of 0.  Axillary nerve sensation/motor altered in setting of block and unable to be fully tested.  Distal motor and sensory altered in setting of block.   Vitals:   07/26/22 0100 07/26/22 0553  BP: (!) 152/98 (!) 140/81  Pulse: 77 64  Resp: 18 17  Temp: 97.7 F (36.5 C) 97.6 F (36.4 C)  SpO2: 95% 94%   Stable post-op images.   ASSESSMENT: Lauren Moore is a 62 y.o. female doing well postoperatively. POD#1  PLAN: Weightbearing:  - NWB in sling until her block wears off  - can use her arm for weightbearing activities using a walker  - okay for ADLs in front of her body - no internal rotation behind the waist for 3 months however  Insicional and dressing care: Reinforce dressings as needed Orthopedic device(s): None Showering: post-op day #1 VTE prophylaxis: Aspirin 81 mg BID Pain control: PRN pain medications, minimize narcotics as able Follow - up plan: 1 week in office  Dispo: Plan for discharge home after OT  Contact information:  Weekdays 8-5 Dr. Ramond Marrow, Alfonse Alpers PA-C, After hours and holidays please check [email or web address] for group call information for [facility]  Alfonse Alpers, PA-C 07/26/2022

## 2022-07-26 NOTE — Discharge Summary (Signed)
   Patient ID: Lauren Moore MRN: [id number] DOB/AGE: [birth year] 62 y.o.  Admit date: 07/25/2022 Discharge date: 07/26/2022  Admission Diagnoses:Left irreparable cuff tear   Discharge Diagnoses:  Principal Problem:   Status post reverse total replacement of left shoulder   Past Medical History:  Diagnosis Date   Arthritis    Brain injury (HCC)    Dyspnea    Hypertension    Obesity    Sleep apnea    cpap    Procedures Performed: Left reverse total shoulder arthroplasty  Discharged Condition: stable  Hospital Course: Patient brought in to [facility] for scheduled procedure. She tolerated procedure well.  Was kept for monitoring overnight for pain control and medical monitoring postop and was found to be stable for DC home the morning after surgery.  Patient was instructed on specific activity restrictions and all questions were answered.  Consults: OT  Significant Diagnostic Studies: No additional pertinent studies  Treatments: Surgery  Discharge Exam: General: resting in hospital bed, NAD Cardiac: regular rate Pulmonary: no increased work of breathing LUE: Dressing CDI and sling well fitting,  full and painless ROM throughout hand with DPC of 0.  Axillary nerve sensation/motor altered in setting of block and unable to be fully tested.  Distal motor and sensory altered in setting of block.  Disposition: Discharge disposition: 01-Home or Self Care       Discharge Instructions     Call MD for:  redness, tenderness, or signs of infection (pain, swelling, redness, odor or green/yellow discharge around incision site)   Complete by: As directed    Call MD for:  severe uncontrolled pain   Complete by: As directed    Call MD for:  temperature >100.4   Complete by: As directed    Diet - low sodium heart healthy   Complete by: As directed       Allergies as of 07/26/2022   No Known Allergies      Medication List     TAKE these medications     acetaminophen 500 MG tablet Commonly known as: TYLENOL Take 2 tablets (1,000 mg total) by mouth every 8 (eight) hours for 14 days.   albuterol 108 (90 Base) MCG/ACT inhaler Commonly known as: VENTOLIN HFA Inhale 1-2 puffs into the lungs every 6 (six) hours as needed for wheezing or shortness of breath. What changed: when to take this   aspirin 81 MG chewable tablet Commonly known as: Aspirin Childrens Chew 1 tablet (81 mg total) by mouth 2 (two) times daily. For 6 weeks for DVT prophylaxis after surgery   celecoxib 100 MG capsule Commonly known as: CeleBREX Take 1 capsule (100 mg total) by mouth 2 (two) times daily. For 2 weeks. Then take as needed   omeprazole 20 MG capsule Commonly known as: PRILOSEC Take 1 capsule (20 mg total) by mouth daily. To gastric protection while taking NSAIDs   ondansetron 4 MG tablet Commonly known as: Zofran Take 1 tablet (4 mg total) by mouth every 8 (eight) hours as needed for up to 7 days for nausea or vomiting.   oxyCODONE 5 MG immediate release tablet Commonly known as: Oxy IR/ROXICODONE Take 1-2 pills every 6 hrs as needed for severe pain, no more than 6 per day        Alfonse Alpers, PA-C 07/26/2022

## 2022-07-26 NOTE — Evaluation (Signed)
Occupational Therapy Evaluation Patient Details Name: Lauren Moore MRN: [id number] DOB: 27-Oct-1960 Today's Date: 07/26/2022   History of Present Illness Ms. Freelove is a 62 yr old female who is s/p a L reverse shoulder arthroplasty on 07-25-22, due to an irreparable cuff tear.   Clinical Impression   Patient is s/p shoulder replacement without functional use of left non-dominant upper extremity. Therapist provided education and instruction to patient and her sister/caregiver with regards to LUE ROM exercises, post-op precautions, UE positioning, donning upper extremity clothing, recommendations for bathing while maintaining shoulder precautions, use of ice for pain and edema management, correct use ice machine, and donning/doffing sling. Patient and her sister verbalized and demonstrated understanding as needed. Patient needed assistance to donn shirt and pants, with instruction provided on compensatory strategies to perform ADLs. Patient to follow up with MD for further therapy needs.        Recommendations for follow up therapy are one component of a multi-disciplinary discharge planning process, led by the attending physician.  Recommendations may be updated based on patient status, additional functional criteria and insurance authorization.   Assistance Recommended at Discharge Intermittent Supervision/Assistance  Patient can return home with the following Assist for transportation;Assistance with cooking/housework;A little help with bathing/dressing/bathroom;Help with stairs or ramp for entrance    Functional Status Assessment  Patient has had a recent decline in their functional status and demonstrates the ability to make significant improvements in function in a reasonable and predictable amount of time.  Equipment Recommendations  None recommended by OT       Precautions / Restrictions Precautions Precautions: Shoulder Type of Shoulder Precautions: LUE NWB, can use LUE for  weight-bearing activities using a walker, okay for ADLs in front of body, no internal rotation behind waist for 3 months Shoulder Interventions: Shoulder sling/immobilizer Precaution Booklet Issued: Yes (comment) Required Braces or Orthoses: Sling Restrictions Weight Bearing Restrictions: Yes LUE Weight Bearing: Non weight bearing      Mobility Bed Mobility               General bed mobility comments: pt was found seated EOB    Transfers Overall transfer level: Needs assistance Equipment used: Rolling walker (2 wheels) Transfers: Sit to/from Stand Sit to Stand: Supervision                  Balance Overall balance assessment: Mild deficits observed, not formally tested              ADL either performed or assessed with clinical judgement      Pertinent Vitals/Pain Pain Assessment Pain Assessment: No/denies pain     Hand Dominance Right      Communication Communication Communication: No difficulties   Cognition Arousal/Alertness: Awake/alert Behavior During Therapy: WFL for tasks assessed/performed Overall Cognitive Status: Within Functional Limits for tasks assessed          General Comments: She reported a history of an old brain injury.           Shoulder Instructions Shoulder Instructions Donning/doffing shirt without moving shoulder: Minimal assistance (instruction provided to pt and her caregiver) Method for sponge bathing under operated UE: Caregiver independent with task Donning/doffing sling/immobilizer: Minimal assistance (instruction provided to pt and her caregiver) Correct positioning of sling/immobilizer: Minimal assistance (instruction provided to pt and her caregiver) Pendulum exercises (written home exercise program):  (N/A) ROM for elbow, wrist and digits of operated UE: Caregiver independent with task Sling wearing schedule (on at all times/off for ADL's): Caregiver independent with  task Proper positioning of operated UE  when showering: Caregiver independent with task Positioning of UE while sleeping: Caregiver independent with task    Home Living Family/patient expects to be discharged to:: Private residence Living Arrangements: Other (Comment) (Sister who is her caregiver) Available Help at Discharge: Family Type of Home: House Home Access: Stairs to enter Secretary/administrator of Steps: 3 Entrance Stairs-Rails: Right Home Layout: One level     Bathroom Shower/Tub: Walk-in shower         Home Equipment: Shower seat;Cane - single Librarian, academic (2 wheels);BSC/3in1          Prior Functioning/Environment Prior Level of Function : Independent/Modified Independent             Mobility Comments: She occasionally used a rolling walker for ambulation. ADLs Comments: She was independent with ADLs; her sister performed the cooking, cleaning, and driving.        OT Problem List: Impaired UE functional use      OT Treatment/Interventions:   N/A      OT Frequency:  N/A       AM-PAC OT "6 Clicks" Daily Activity     Outcome Measure Help from another person eating meals?: None Help from another person taking care of personal grooming?: None Help from another person toileting, which includes using toliet, bedpan, or urinal?: A Little Help from another person bathing (including washing, rinsing, drying)?: A Little Help from another person to put on and taking off regular upper body clothing?: A Little Help from another person to put on and taking off regular lower body clothing?: A Little 6 Click Score: 20   End of Session Equipment Utilized During Treatment: Rolling walker (2 wheels) Nurse Communication: Other (comment) (shoulder education provided)  Activity Tolerance: Patient tolerated treatment well Patient left: in bed;with call bell/phone within reach;with family/visitor present  OT Visit Diagnosis: Muscle weakness (generalized) (M62.81)                Time: 8469-6295 OT  Time Calculation (min): 27 min Charges:  OT General Charges $OT Visit: 1 Visit OT Evaluation $OT Eval Low Complexity: 1 Low OT Treatments $Self Care/Home Management : 8-22 mins    Reuben Likes, OTR/L 07/26/2022, 10:10 AM

## 2022-07-26 NOTE — Plan of Care (Signed)
  Problem: Activity: Goal: Ability to tolerate increased activity will improve Outcome: Progressing   Problem: Pain Management: Goal: Pain level will decrease with appropriate interventions Outcome: Progressing   Problem: Education: Goal: Knowledge of General Education information will improve Description: Including pain rating scale, medication(s)/side effects and non-pharmacologic comfort measures Outcome: Progressing   

## 2022-12-17 ENCOUNTER — Encounter: Payer: Self-pay | Admitting: Gastroenterology

## 2022-12-24 ENCOUNTER — Encounter: Payer: Self-pay | Admitting: Gastroenterology

## 2023-01-14 ENCOUNTER — Ambulatory Visit (INDEPENDENT_AMBULATORY_CARE_PROVIDER_SITE_OTHER): Payer: 59 | Admitting: Pulmonary Disease

## 2023-01-14 ENCOUNTER — Encounter: Payer: Self-pay | Admitting: Pulmonary Disease

## 2023-01-14 VITALS — BP 137/85 | HR 80 | Ht 64.0 in | Wt 281.0 lb

## 2023-01-14 DIAGNOSIS — G4719 Other hypersomnia: Secondary | ICD-10-CM | POA: Diagnosis not present

## 2023-01-14 DIAGNOSIS — G4733 Obstructive sleep apnea (adult) (pediatric): Secondary | ICD-10-CM | POA: Diagnosis not present

## 2023-01-14 NOTE — Progress Notes (Signed)
Lauren Moore    [id number]    [birth year]  Primary Care Physician:Jackson, Rondel Oh, PA-C  Referring Physician: Barnie Mort, PA-C [address] [place],  [place] [address]  Chief complaint:   Patient being seen for shortness of breath, sleep disordered breathing  HPI:  Has been doing relatively well with no significant exacerbation of symptoms  Activity level is low  Had a sleep study in 2019 Had a repeat sleep study recently December 2023 showing severe obstructive sleep apnea Now currently on auto CPAP-5-20  She is sleeping well Wakes up feeling like she is at a good nights rest Occasional naps during the day  Continues to tolerate CPAP without significant problems  Usually goes to bed between 9 and 10, final wake up time about 9 AM Feels she is tolerating the CPAP well  She is limited with activities of daily living only able to walk about a block She will feel weak with exertion Does have a lot of knee pain with the knee giving out on once in a while  Denies any significant dryness of her mouth in the morning Occasional headaches No chest pains or chest discomfort Memory is fair  Reformed smoker quit over 10 years ago was smoking only a few cigarettes a day  Outpatient Encounter Medications as of 01/14/2023  Medication Sig   albuterol (VENTOLIN HFA) 108 (90 Base) MCG/ACT inhaler Inhale 1-2 puffs into the lungs every 6 (six) hours as needed for wheezing or shortness of breath. (Patient taking differently: Inhale 1-2 puffs into the lungs 2 (two) times daily.)   No facility-administered encounter medications on file as of 01/14/2023.    Allergies as of 01/14/2023   (No Known Allergies)    Past Medical History:  Diagnosis Date   Arthritis    Brain injury (HCC)    Dyspnea    Hypertension    Obesity    Sleep apnea    cpap    Past Surgical History:  Procedure Laterality Date   BRAIN SURGERY     COLONOSCOPY N/A  06/26/2012   Procedure: COLONOSCOPY;  Surgeon: Barrie Folk, MD;  Location: [facility];  Service: Endoscopy;  Laterality: N/A;   REVERSE SHOULDER ARTHROPLASTY Left 07/25/2022   Procedure: LEFT REVERSE SHOULDER ARTHROPLASTY;  Surgeon: Bjorn Pippin, MD;  Location: [facility];  Service: Orthopedics;  Laterality: Left;   TYMPANOPLASTY Right 11/25/2017   Procedure: TYMPANOPLASTY;  Surgeon: Serena Colonel, MD;  Location: [facility];  Service: ENT;  Laterality: Right;    Family History  Problem Relation Age of Onset   Cancer Sister        Lung   Hypertension Sister    Diabetes Sister    Cancer Brother        Lung   Stroke Brother    Hypertension Brother     Social History   Socioeconomic History   Marital status: Single    Spouse name: Not on file   Number of children: Not on file   Years of education: Not on file   Highest education level: Not on file  Occupational History   Not on file  Tobacco Use   Smoking status: Never   Smokeless tobacco: Never  Vaping Use   Vaping status: Never Used  Substance and Sexual Activity   Alcohol use: Yes    Comment: 4-5 beers per week   Drug use: Never    Comment: states  not smoked in one year   Sexual activity: Not Currently  Other Topics Concern   Not on file  Social History Narrative   ** Merged History Encounter **       Lives in boarding house with friends   Social Drivers of Health   Financial Resource Strain: Low Risk  (06/12/2022)   Received from [facility]   Overall Financial Resource Strain (CARDIA)    Difficulty of Paying Living Expenses: Not hard at all  Food Insecurity: No Food Insecurity (07/25/2022)   Hunger Vital Sign    Worried About Running Out of Food in the Last Year: Never true    Ran Out of Food in the Last Year: Never true  Transportation Needs: No Transportation Needs (07/25/2022)   PRAPARE - Administrator, Civil Service (Medical): No    Lack of Transportation  (Non-Medical): No  Physical Activity: Unknown (11/15/2021)   Received from [facility]   Exercise Vital Sign    Days of Exercise per Week: 0 days    Minutes of Exercise per Session: Not on file  Stress: No Stress Concern Present (11/15/2021)   Received from [facility] - Occupational Stress Questionnaire    Feeling of Stress : Not at all  Social Connections: Unknown (11/24/2022)   Received from [facility]   Social Network    Social Network: Not on file  Intimate Partner Violence: Unknown (11/24/2022)   Received from [facility]   HITS    Physically Hurt: Not on file    Insult or Talk Down To: Not on file    Threaten Physical Harm: Not on file    Scream or Curse: Not on file    Review of Systems  Constitutional:  Positive for fatigue.  Respiratory:  Positive for apnea and shortness of breath.   Psychiatric/Behavioral:  Positive for sleep disturbance.     Vitals:   01/14/23 1555  BP: 137/85  Pulse: 80  SpO2: 98%     Physical Exam Constitutional:      Appearance: She is obese.  HENT:     Head: Normocephalic.     Mouth/Throat:     Comments: Mallampati 3, crowded oropharynx Cardiovascular:     Rate and Rhythm: Normal rate and regular rhythm.     Heart sounds: No murmur heard.    No friction rub.  Pulmonary:     Effort: No respiratory distress.     Breath sounds: No stridor. No wheezing or rhonchi.  Musculoskeletal:     Cervical back: No rigidity or tenderness.  Neurological:     Mental Status: She is alert.  Psychiatric:        Mood and Affect: Mood normal.    Data Reviewed: Previous sleep study from 2019 reviewed by myself showing she was titrated to CPAP therapy Study showed AHI of over 32, titrated to CPAP of 17  CPAP compliance reviewed today showing 92% compliance AutoSet of 4-20 Residual AHI of 1.6  CPAP compliance reviewed for the last 30 days up until 01/13/2023  showing 11 hours of sleep, AutoSet 4-20 Residual AHI 1.7  Assessment:  Severe obstructive sleep apnea Appears adequately treated with CPAP therapy -Continues to benefit from CPAP use  Excessive daytime sleepiness -She is feeling rested -Benefiting from CPAP use  Multifactorial shortness of breath -Deconditioning likely playing a role -Regular activities as tolerated  Importance of regular activities discussed  Plan/Recommendations: Weight  loss as tolerated  Continue using CPAP on a nightly basis  Follow-up in about 6 months  Encouraged to call with significant concerns  I spent 30 minutes dedicated to the care of this patient on the date of this encounter to include previsit review of records, face-to-face time with the patient discussing conditions above, post visit ordering of testing, clinical documentation with electronic health record.  Virl Diamond MD [facility] 01/14/2023, 4:20 PM  CC: Barnie Mort, PA-C

## 2023-01-14 NOTE — Patient Instructions (Signed)
I will see you 6 months from here  Continue using your CPAP on a nightly basis  Gradual graded exercises as tolerated  Call us with significant concerns

## 2023-02-05 ENCOUNTER — Ambulatory Visit (AMBULATORY_SURGERY_CENTER): Payer: 59

## 2023-02-05 VITALS — Ht 64.0 in | Wt 282.0 lb

## 2023-02-05 DIAGNOSIS — Z1211 Encounter for screening for malignant neoplasm of colon: Secondary | ICD-10-CM

## 2023-02-05 MED ORDER — NA SULFATE-K SULFATE-MG SULF 17.5-3.13-1.6 GM/177ML PO SOLN: 1.0000 | Freq: Once | ORAL | 0 refills | Status: AC

## 2023-02-05 NOTE — Progress Notes (Signed)
 No egg or soy allergy known to patient  No issues known to pt with past sedation with any surgeries or procedures Patient denies ever being told they had issues or difficulty with intubation  No FH of Malignant Hyperthermia Pt is not on diet pills Pt is not on  home 02. OSA using CPAP Pt is not on blood thinners  Pt denies issues with constipation  No A fib or A flutter Have any cardiac testing pending--no  LOA: independent  Prep: suprep   Patient's chart reviewed by Norleen Schillings CNRA prior to previsit and patient appropriate for the [facility].  Previsit completed and red dot placed by patient's name on their procedure day (on provider's schedule).     PV competed with patient and her sister Ronal (DPR on file). Prep instructions sent via mychart and home address. Goodrx coupon for provided to use for price reduction if needed.

## 2023-02-19 ENCOUNTER — Ambulatory Visit (AMBULATORY_SURGERY_CENTER): Payer: 59 | Admitting: Gastroenterology

## 2023-02-19 ENCOUNTER — Encounter: Payer: Self-pay | Admitting: Gastroenterology

## 2023-02-19 VITALS — BP 129/88 | HR 79 | Temp 97.3°F | Resp 11 | Ht 64.0 in | Wt 282.0 lb

## 2023-02-19 DIAGNOSIS — K5730 Diverticulosis of large intestine without perforation or abscess without bleeding: Secondary | ICD-10-CM | POA: Diagnosis not present

## 2023-02-19 DIAGNOSIS — D175 Benign lipomatous neoplasm of intra-abdominal organs: Secondary | ICD-10-CM | POA: Diagnosis not present

## 2023-02-19 DIAGNOSIS — Z1211 Encounter for screening for malignant neoplasm of colon: Secondary | ICD-10-CM | POA: Diagnosis not present

## 2023-02-19 DIAGNOSIS — D122 Benign neoplasm of ascending colon: Secondary | ICD-10-CM

## 2023-02-19 DIAGNOSIS — K640 First degree hemorrhoids: Secondary | ICD-10-CM

## 2023-02-19 DIAGNOSIS — K648 Other hemorrhoids: Secondary | ICD-10-CM

## 2023-02-19 MED ORDER — SODIUM CHLORIDE 0.9 % IV SOLN
500.0000 mL | Freq: Once | INTRAVENOUS | Status: DC
Start: 2023-02-19 — End: 2023-02-19

## 2023-02-19 NOTE — Op Note (Signed)
[facility] Patient Name: Lauren Moore Procedure Date: 02/19/2023 11:22 AM MRN: [id number] Endoscopist: Doristine Locks , MD, [id number] Age: 63 Referring MD:  Date of Birth: 1960/06/14 Gender: Female Account #: [id number] Procedure:                Colonoscopy Indications:              Screening for colorectal malignant neoplasm (last                            colonoscopy was 10 years ago)                           Last colonoscopy was 05/2012 and notable for mild                            diverticulosis, small internal hemorrhoids,                            otherwise normal. Medicines:                Monitored Anesthesia Care Procedure:                Pre-Anesthesia Assessment:                           - Prior to the procedure, a History and Physical                            was performed, and patient medications and                            allergies were reviewed. The patient's tolerance of                            previous anesthesia was also reviewed. The risks                            and benefits of the procedure and the sedation                            options and risks were discussed with the patient.                            All questions were answered, and informed consent                            was obtained. Prior Anticoagulants: The patient has                            taken no anticoagulant or antiplatelet agents. ASA                            Grade Assessment: III - A patient with severe  systemic disease. After reviewing the risks and                            benefits, the patient was deemed in satisfactory                            condition to undergo the procedure.                           After obtaining informed consent, the colonoscope                            was passed under direct vision. Throughout the                            procedure, the patient's blood pressure, pulse, and                             oxygen saturations were monitored continuously. The                            Olympus CF-HQ190L ([id number]) Colonoscope was                            introduced through the anus and advanced to the the                            cecum, identified by appendiceal orifice and                            ileocecal valve. The colonoscopy was performed                            without difficulty. The patient tolerated the                            procedure well. The quality of the bowel                            preparation was good. The ileocecal valve,                            appendiceal orifice, and rectum were photographed. Scope In: 11:27:50 AM Scope Out: 11:39:52 AM Scope Withdrawal Time: 0 hours 10 minutes 16 seconds  Total Procedure Duration: 0 hours 12 minutes 2 seconds  Findings:                 The perianal and digital rectal examinations were                            normal.                           A 3 mm polyp was found in the ascending colon. The  polyp was sessile. The polyp was removed with a                            cold snare. Resection and retrieval were complete.                            Estimated blood loss was minimal.                           There was a medium-sized lipoma, in the ascending                            colon.                           Multiple large-mouthed and small-mouthed                            diverticula were found in the entire colon.                           Non-bleeding internal hemorrhoids were found during                            retroflexion. The hemorrhoids were small. Complications:            No immediate complications. Estimated Blood Loss:     Estimated blood loss was minimal. Impression:               - One 3 mm polyp in the ascending colon, removed                            with a cold snare. Resected and retrieved.                           - Medium-sized lipoma in the  ascending colon.                           - Diverticulosis in the entire examined colon.                           - Non-bleeding internal hemorrhoids. Recommendation:           - Patient has a contact number available for                            emergencies. The signs and symptoms of potential                            delayed complications were discussed with the                            patient. Return to normal activities tomorrow.                            Written discharge instructions were provided to  the                            patient.                           - Resume previous diet.                           - Continue present medications.                           - Await pathology results.                           - Repeat colonoscopy for surveillance based on                            pathology results.                           - Return to GI clinic PRN. Doristine Locks, MD 02/19/2023 11:44:40 AM

## 2023-02-19 NOTE — Progress Notes (Signed)
GASTROENTEROLOGY PROCEDURE H&P NOTE   Primary Care Physician: Barnie Mort, PA-C    Reason for Procedure:  Colon Cancer screening  Plan:    Colonoscopy  Patient is appropriate for endoscopic procedure(s) in the ambulatory ([facility]) setting.  The nature of the procedure, as well as the risks, benefits, and alternatives were carefully and thoroughly reviewed with the patient. Ample time for discussion and questions allowed. The patient understood, was satisfied, and agreed to proceed.     HPI: Lauren Moore is a 63 y.o. female who presents for colonoscopy for routine Colon Cancer screening.  No active GI symptoms.  No known family history of colon cancer or related malignancy.  Patient is otherwise without complaints or active issues today.  Last colonoscopy was 05/2012 and notable for mild diverticulosis, small internal hemorrhoids, otherwise normal.   Past Medical History:  Diagnosis Date   Arthritis    Brain injury (HCC)    Dyspnea    Hypertension    Obesity    Sleep apnea    cpap    Past Surgical History:  Procedure Laterality Date   BRAIN SURGERY     COLONOSCOPY N/A 06/26/2012   Procedure: COLONOSCOPY;  Surgeon: Barrie Folk, MD;  Location: [facility];  Service: Endoscopy;  Laterality: N/A;   REVERSE SHOULDER ARTHROPLASTY Left 07/25/2022   Procedure: LEFT REVERSE SHOULDER ARTHROPLASTY;  Surgeon: Bjorn Pippin, MD;  Location: [facility];  Service: Orthopedics;  Laterality: Left;   TYMPANOPLASTY Right 11/25/2017   Procedure: TYMPANOPLASTY;  Surgeon: Serena Colonel, MD;  Location: [facility];  Service: ENT;  Laterality: Right;    Prior to Admission medications   Medication Sig Start Date End Date Taking? Authorizing Provider  albuterol (VENTOLIN HFA) 108 (90 Base) MCG/ACT inhaler Inhale 1-2 puffs into the lungs every 6 (six) hours as needed for wheezing or shortness of breath. Patient taking differently: Inhale 1-2 puffs into the lungs 2 (two) times  daily. 09/26/20   Nche, Bonna Gains, NP    Current Outpatient Medications  Medication Sig Dispense Refill   albuterol (VENTOLIN HFA) 108 (90 Base) MCG/ACT inhaler Inhale 1-2 puffs into the lungs every 6 (six) hours as needed for wheezing or shortness of breath. (Patient taking differently: Inhale 1-2 puffs into the lungs 2 (two) times daily.) 8 g 0   Current Facility-Administered Medications  Medication Dose Route Frequency Provider Last Rate Last Admin   0.9 %  sodium chloride infusion  500 mL Intravenous Once Ercell Razon V, DO        Allergies as of 02/19/2023   (No Known Allergies)    Family History  Problem Relation Age of Onset   Cancer Sister        Lung   Hypertension Sister    Diabetes Sister    Cancer Brother        Lung   Stroke Brother    Hypertension Brother    Colon cancer Neg Hx    Colon polyps Neg Hx    Rectal cancer Neg Hx    Stomach cancer Neg Hx     Social History   Socioeconomic History   Marital status: Single    Spouse name: Not on file   Number of children: Not on file   Years of education: Not on file   Highest education level: Not on file  Occupational History   Not on file  Tobacco Use   Smoking status: Never   Smokeless tobacco: Never  Vaping Use  Vaping status: Never Used  Substance and Sexual Activity   Alcohol use: Yes    Comment: 4-5 beers per week   Drug use: Never    Comment: states not smoked in one year   Sexual activity: Not Currently  Other Topics Concern   Not on file  Social History Narrative   ** Merged History Encounter **       Lives in boarding house with friends   Social Drivers of Health   Financial Resource Strain: Low Risk  (06/12/2022)   Received from [facility], [facility]   Overall Financial Resource Strain (CARDIA)    Difficulty of Paying Living Expenses: Not hard at all  Food Insecurity: No Food Insecurity (07/25/2022)   Hunger Vital Sign    Worried About Running Out of Food in the Last  Year: Never true    Ran Out of Food in the Last Year: Never true  Transportation Needs: No Transportation Needs (07/25/2022)   PRAPARE - Administrator, Civil Service (Medical): No    Lack of Transportation (Non-Medical): No  Physical Activity: Unknown (11/15/2021)   Received from [facility], [facility]   Exercise Vital Sign    Days of Exercise per Week: 0 days    Minutes of Exercise per Session: Not on file  Stress: No Stress Concern Present (11/15/2021)   Received from [facility], [facility] - Occupational Stress Questionnaire    Feeling of Stress : Not at all  Social Connections: Unknown (11/24/2022)   Received from [facility]   Social Network    Social Network: Not on file  Intimate Partner Violence: Unknown (11/24/2022)   Received from [facility]   HITS    Physically Hurt: Not on file    Insult or Talk Down To: Not on file    Threaten Physical Harm: Not on file    Scream or Curse: Not on file    Physical Exam: Vital signs in last 24 hours: @BP  113/74   Pulse 83   Temp (!) 97.3 F (36.3 C) (Temporal)   Ht 5\' 4"  (1.626 m)   Wt 282 lb (127.9 kg)   SpO2 97%   BMI 48.41 kg/m  GEN: NAD EYE: Sclerae anicteric ENT: MMM CV: Non-tachycardic Pulm: CTA b/l GI: Soft, NT/ND NEURO:  Alert & Oriented x 3   Doristine Locks, DO [facility]   02/19/2023 11:21 AM

## 2023-02-19 NOTE — Patient Instructions (Addendum)
- Resume previous diet.                           - Continue present medications.                           - Await pathology results.                           - 1 polyp removed.  Diverticulosis and hemorrhoids.                           - Repeat colonoscopy for surveillance based on                            pathology results.                           - Return to GI clinic PRN  YOU HAD AN ENDOSCOPIC PROCEDURE TODAY AT THE [facility]:   Refer to the procedure report that was given to you for any specific questions about what was found during the examination.  If the procedure report does not answer your questions, please call your gastroenterologist to clarify.  If you requested that your care partner not be given the details of your procedure findings, then the procedure report has been included in a sealed envelope for you to review at your convenience later.  YOU SHOULD EXPECT: Some feelings of bloating in the abdomen. Passage of more gas than usual.  Walking can help get rid of the air that was put into your GI tract during the procedure and reduce the bloating. If you had a lower endoscopy (such as a colonoscopy or flexible sigmoidoscopy) you may notice spotting of blood in your stool or on the toilet paper. If you underwent a bowel prep for your procedure, you may not have a normal bowel movement for a few days.  Please Note:  You might notice some irritation and congestion in your nose or some drainage.  This is from the oxygen used during your procedure.  There is no need for concern and it should clear up in a day or so.  SYMPTOMS TO REPORT IMMEDIATELY:  Following lower endoscopy (colonoscopy or flexible sigmoidoscopy):  Excessive amounts of blood in the stool  Significant tenderness or worsening of abdominal pains  Swelling of the abdomen that is new, acute  Fever of 100F or higher   For urgent or emergent issues, a gastroenterologist can  be reached at any hour by calling [phone]. Do not use MyChart messaging for urgent concerns.    DIET:  We do recommend a small meal at first, but then you may proceed to your regular diet.  Drink plenty of fluids but you should avoid alcoholic beverages for 24 hours.  ACTIVITY:  You should plan to take it easy for the rest of today and you should NOT DRIVE or use heavy machinery until tomorrow (because of the sedation medicines used during the test).    FOLLOW UP: Our staff will call the number listed on your records the next business day following your procedure.  We will call around 7:15- 8:00 am to check on you and address any questions or concerns that you may have regarding the  information given to you following your procedure. If we do not reach you, we will leave a message.     If any biopsies were taken you will be contacted by phone or by letter within the next 1-3 weeks.  Please call us at [phone] if you have not heard about the biopsies in 3 weeks.    SIGNATURES/CONFIDENTIALITY: You and/or your care partner have signed paperwork which will be entered into your electronic medical record.  These signatures attest to the fact that that the information above on your After Visit Summary has been reviewed and is understood.  Full responsibility of the confidentiality of this discharge information lies with you and/or your care-partner.

## 2023-02-19 NOTE — Progress Notes (Signed)
Report to PACU, RN, vss, BBS= Clear.  

## 2023-02-19 NOTE — Progress Notes (Signed)
Called to room to assist during endoscopic procedure.  Patient ID and intended procedure confirmed with present staff. Received instructions for my participation in the procedure from the performing physician.  

## 2023-02-19 NOTE — Progress Notes (Signed)
Pt's states no medical or surgical changes since previsit or office visit. 

## 2023-02-20 ENCOUNTER — Telehealth: Payer: Self-pay

## 2023-02-20 NOTE — Telephone Encounter (Signed)
  Follow up Call-     02/19/2023   10:15 AM  Call back number  Post procedure Call Back phone  # [phone]  Permission to leave phone message Yes     Patient questions:  Do you have a fever, pain , or abdominal swelling? No. Pain Score  0 *  Have you tolerated food without any problems? Yes.    Have you been able to return to your normal activities? Yes.    Do you have any questions about your discharge instructions: Diet   No. Medications  No. Follow up visit  No.  Do you have questions or concerns about your Care? No.  Actions: * If pain score is 4 or above: No action needed, pain <4.

## 2023-02-21 LAB — SURGICAL PATHOLOGY

## 2023-02-24 ENCOUNTER — Encounter: Payer: Self-pay | Admitting: Gastroenterology

## 2023-02-25 ENCOUNTER — Other Ambulatory Visit: Payer: Self-pay | Admitting: Physician Assistant

## 2023-02-25 DIAGNOSIS — Z1231 Encounter for screening mammogram for malignant neoplasm of breast: Secondary | ICD-10-CM

## 2023-03-26 ENCOUNTER — Ambulatory Visit: Payer: 59

## 2023-03-27 ENCOUNTER — Ambulatory Visit
Admission: RE | Admit: 2023-03-27 | Discharge: 2023-03-27 | Disposition: A | Discharge status: Home / Self Care | Payer: 59 | Source: Ambulatory Visit | Attending: Physician Assistant | Admitting: Physician Assistant

## 2023-03-27 DIAGNOSIS — Z1231 Encounter for screening mammogram for malignant neoplasm of breast: Secondary | ICD-10-CM

## 2023-04-14 IMAGING — MR MR LUMBAR SPINE W/O CM
4 of 5 series · 18 of 48 positions shown · non-contrast
Comparison: Lumbar spine CT 05/03/2021. Prior lumbar MRI
07/10/2013.

CLINICAL DATA: 60-year-old female with low back and bilateral leg
pain for 6 months. Compression fractures.

EXAM:
MRI LUMBAR SPINE WITHOUT CONTRAST
TECHNIQUE: Multiplanar, multisequence MR imaging of the lumbar spine was
performed. No intravenous contrast was administered.

[Series 5: T2 · sagittal · 4.0mm · 0.73mm/px · 6 of 15 slices shown (1 of 2)]
[im 1/15]
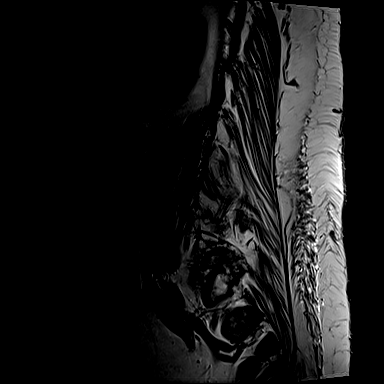
[im 3/15]
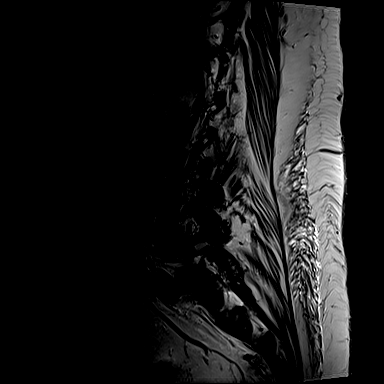
[im 6/15]
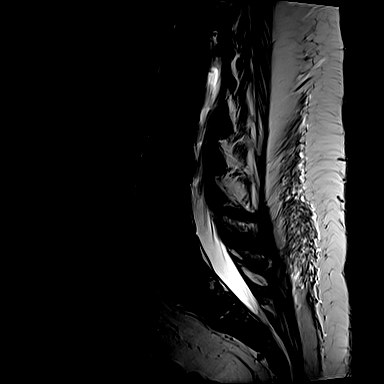
[im 9/15]
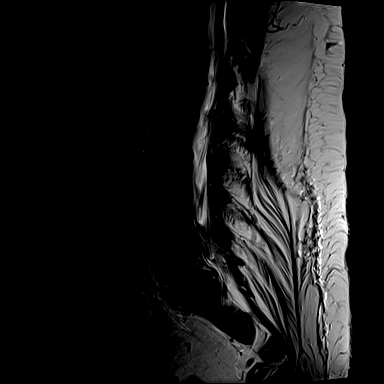
[im 12/15]
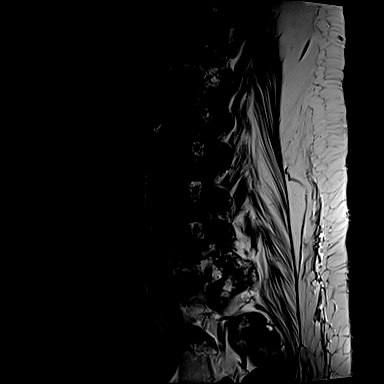
[im 15/15]
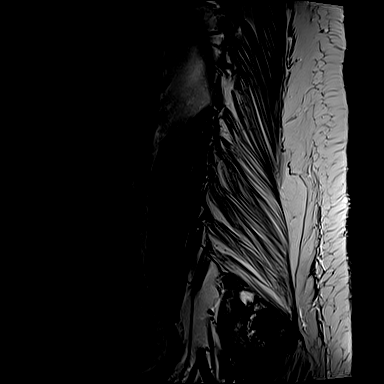

[Series 6: T1 · sagittal · 4.0mm · 0.73mm/px · 3 of 15 slices shown (1 of 2)]
[im 3/15]
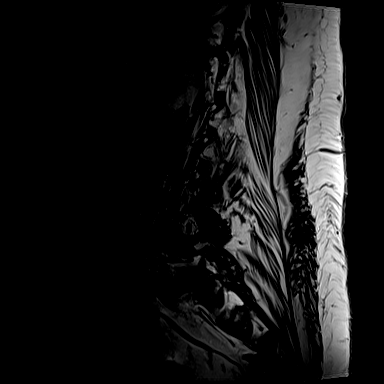
[im 9/15]
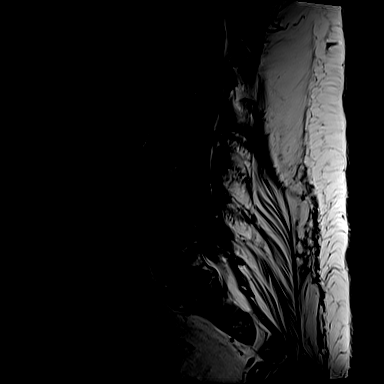
[im 15/15]
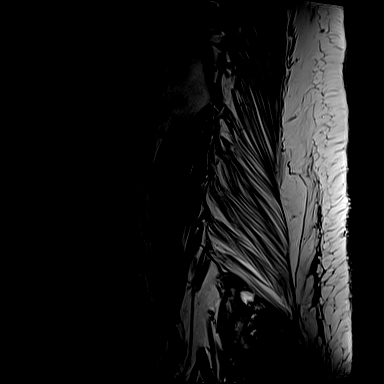

[Series 10: T1 · axial · 4.0mm · 0.28mm/px · z∈[-33,+110]mm · 3 of 39 slices shown (2 of 2)]
[im 6/39]
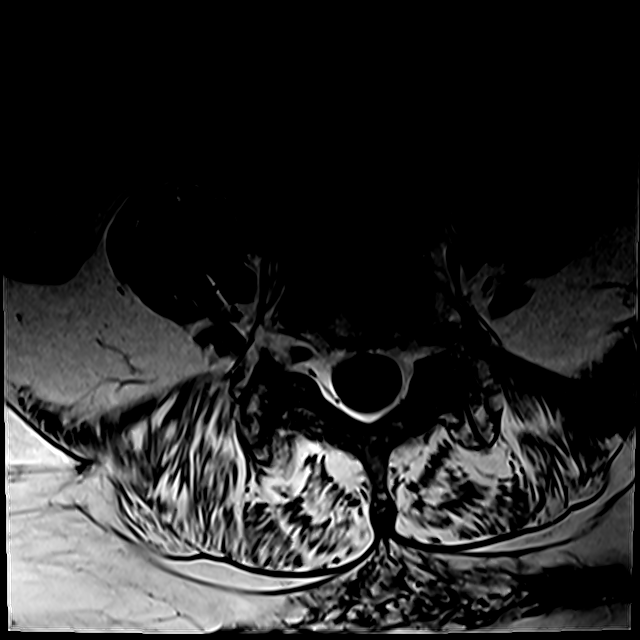
[im 20/39]
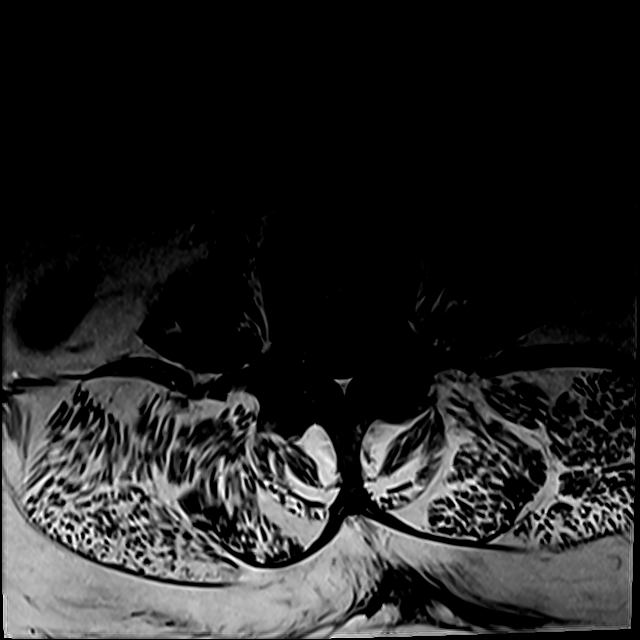
[im 33/39]
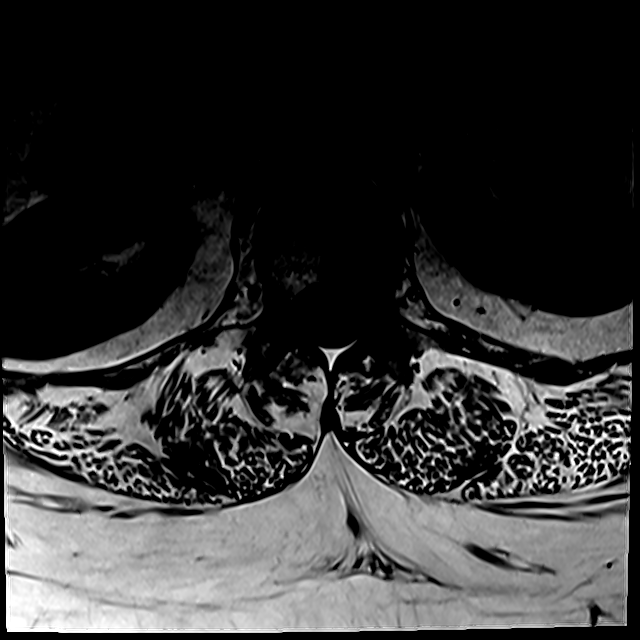

[Series 13: T2 · axial · 4.0mm · 0.28mm/px · z∈[-58,+110]mm · 6 of 39 slices shown (2 of 2)]
[im 1/39]
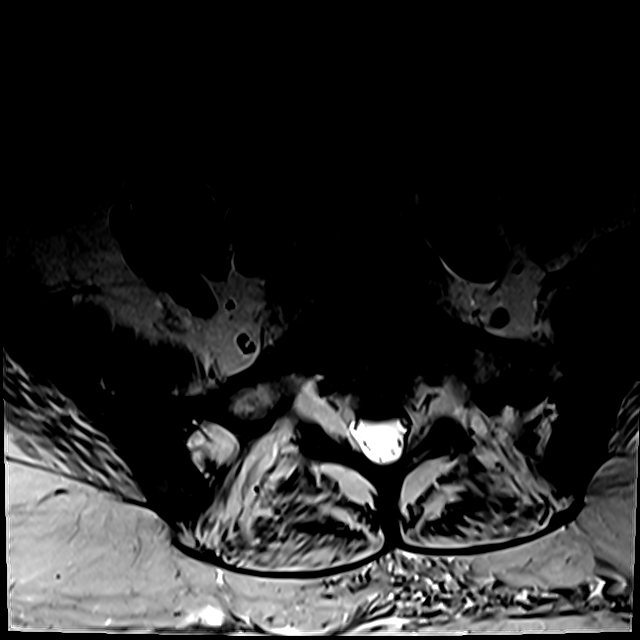
[im 6/39]
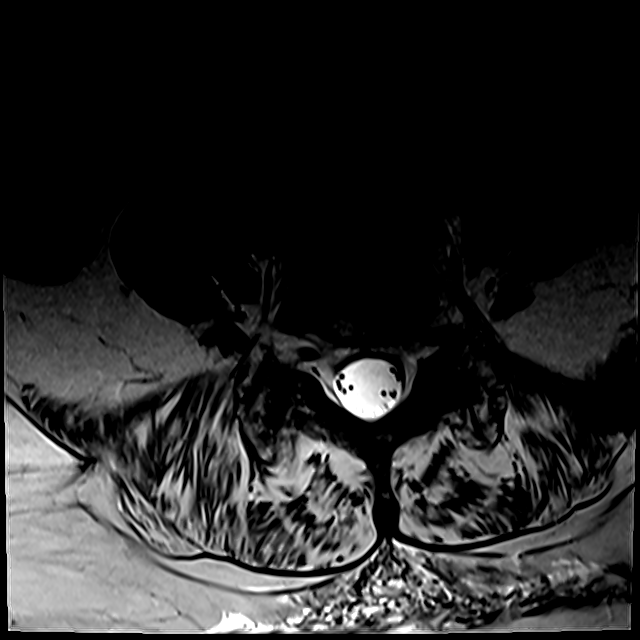
[im 11/39]
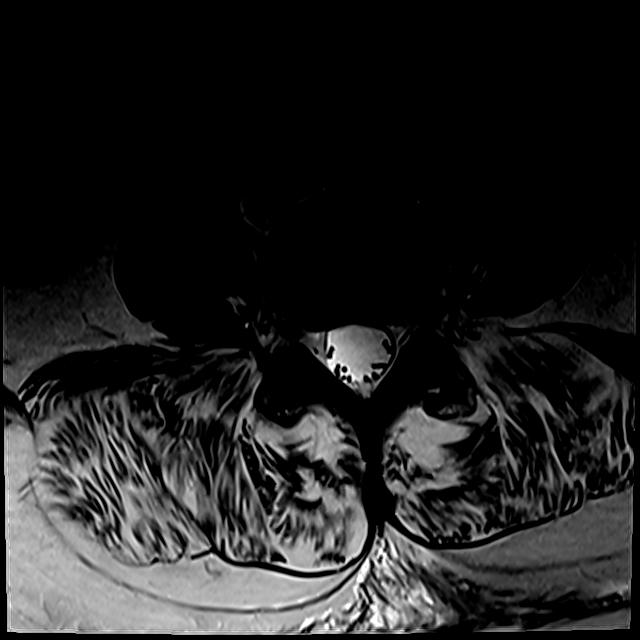
[im 17/39]
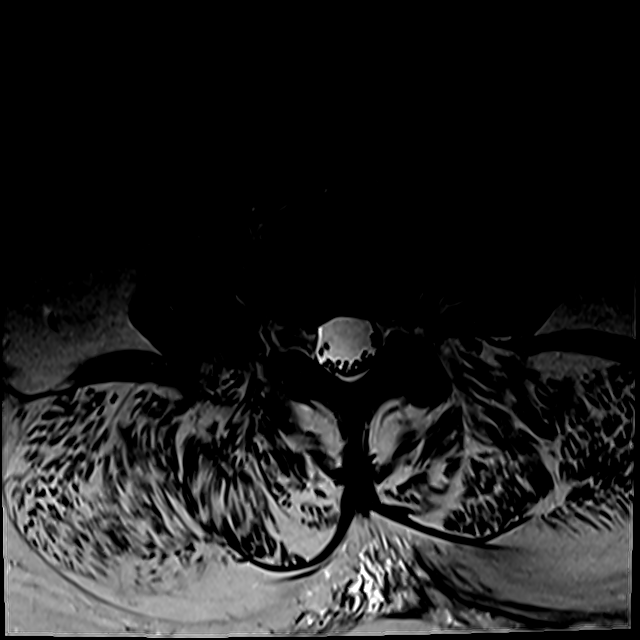
[im 20/39]
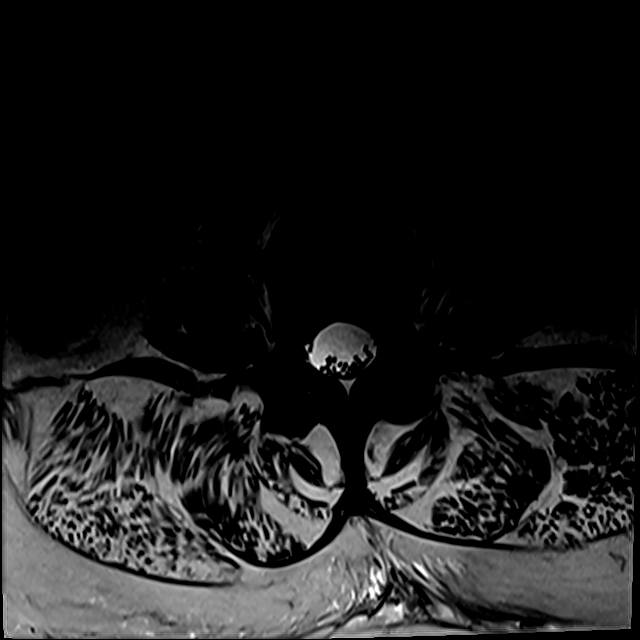
[im 33/39]
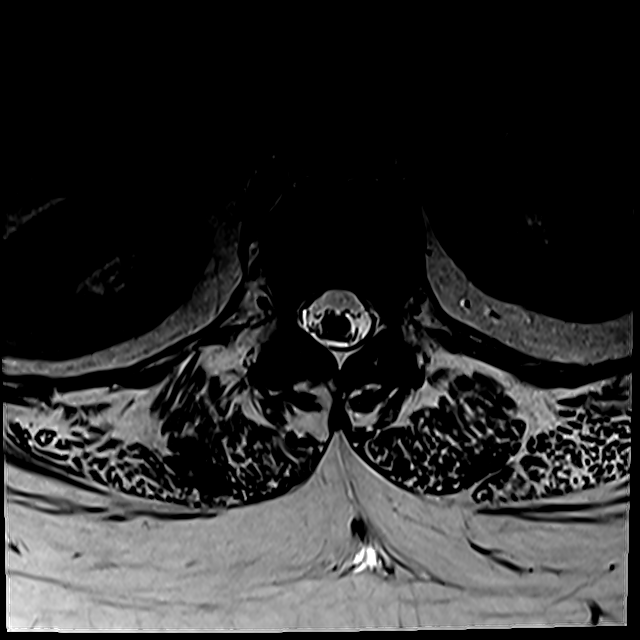

[18 of 48 positions shown; findings below may reference images not displayed]

FINDINGS: Segmentation: Normal on the Lino Anaya CT, same numbering system utilized
thin. There is a vestigial S1-S2 disc space.

Alignment: Stable lumbar lordosis from earlier this year. No
significant spondylolisthesis.

Vertebrae: Heterogeneous bone marrow signal throughout the visible
spine, although the visible sacrum and pelvis may be spared.
Widespread vertebral compression fractures T12 through L5. Of these,
the T12 level appears most chronic, but new since 3905.

The L1 level was mildly compressed in 3905, and now moderately
compressed but not significantly changed since the Lino Anaya CT. There
is associated abnormal L1 inferior endplate decreased T1 marrow
signal. And similar abnormal marrow signal at the L4 inferior and L5
superior endplates (series 6, image 8), also moderately compressed
but not significantly changed from the Lino Anaya CT. Evidence of patchy
vertebral body marrow edema on STIR [HOSPITAL] all 3 levels.

The L3 level is also moderately compressed but appears chronic and
stable since Lino Anaya.

No significant retropulsion. Underlying chronic L1 benign vertebral
body hemangioma. Visible sacrum appears intact.

Conus medullaris and cauda equina: Conus extends to the L1 level. No
lower spinal cord or conus signal abnormality. Capacious spinal
canal. Normal cauda equina nerve roots.

Paraspinal and other soft tissues: Despite the abnormal lumbar
vertebral bodies, there is no paraspinal edema. There is atrophy of
the posterior paraspinal muscles bilaterally. Psoas muscles
relatively spared.

Negative visible abdominal viscera.

Disc levels:

Capacious spinal canal. There is chronic facet arthropathy at L5-S1,
with evidence of developing bilateral facet ankylosis there today
and on the Lino Anaya CT. But no other age advanced lumbar spine
degeneration or spinal stenosis.
IMPRESSION: 1. Diffuse spinal compression fractures T12 through L5, largely new
since 3905 and associated with heterogeneous underlying bone marrow
signal.
It is difficult to exclude pathologic fractures due to Metastatic
disease or Multiple Myeloma.
However, the lumbar spinal compression does not appear significantly
changed from an Lino Anaya Lumbar Spine CT.

2. Underlying capacious spinal canal. No significant retropulsion or
spinal stenosis.

3. Conspicuous posterior paraspinal muscle atrophy is nonspecific.
There is generally mild lumbar spine degeneration, except at L5-S1
where chronic facet arthropathy and developing facet ankylosis are
demonstrated.

## 2023-04-19 ENCOUNTER — Encounter: Payer: Self-pay | Admitting: Podiatry

## 2023-04-19 ENCOUNTER — Ambulatory Visit (INDEPENDENT_AMBULATORY_CARE_PROVIDER_SITE_OTHER): Admitting: Podiatry

## 2023-04-19 DIAGNOSIS — L600 Ingrowing nail: Secondary | ICD-10-CM | POA: Diagnosis not present

## 2023-04-19 NOTE — Patient Instructions (Signed)
 Place 1/4 cup of epsom salts in a quart of warm tap water.  Submerge your foot or feet in the solution and soak for 20 minutes.  This soak should be done twice a day.  Next, remove your foot or feet from solution, blot dry the affected area. Apply ointment and cover if instructed by your doctor.   IF YOUR SKIN BECOMES IRRITATED WHILE USING THESE INSTRUCTIONS, IT IS OKAY TO SWITCH TO  WHITE VINEGAR AND WATER.  As another alternative soak, you may use antibacterial soap and water.  Monitor for any signs/symptoms of infection. Call the office immediately if any occur or go directly to the emergency room. Call with any questions/concerns.  You can apply a small amount of Neosporin for the first 5 to 7 days.  After that time stop using antibiotic ointment.  Continue to do foot soaks and bandaging until a dry scab starts to form.  Allow the toe to air dry before putting the Band-Aid back on.

## 2023-04-19 NOTE — Progress Notes (Signed)
       Subjective:  Patient ID: Lauren Moore, female    DOB: [birth year],  MRN: [id number]  Lauren Moore presents to clinic today for:  Chief Complaint  Patient presents with   Ingrown Toenail    Patient believes she has bilateral ingrown toe nails on bilateral hallux, it is painful to put shoes on and it is sore to the touch around bilateral hallux's  Patient presenting for above complaint.  The bilateral great toenail medial borders are painful to any touch and she has difficulty wearing shoes and socks due to pain.  PCP is Barnie Mort, PA-C.  No Known Allergies  Review of Systems: Negative except as noted in the HPI.  Objective:  There were no vitals filed for this visit.  Lauren Moore is a pleasant 63 y.o. female in NAD. AAO x 3.  Vascular Examination: Capillary refill time is less than 3 seconds to toes bilateral. Palpable pedal pulses b/l LE. Digital hair present b/l. No pedal edema b/l. Skin temperature gradient WNL b/l. No varicosities b/l. No cyanosis or clubbing noted b/l.   Dermatological Examination: There is incurvation of the bilateral hallux medial nail border.  There is pain on palpation of the affected nail border.  Mild localized edema present.  No erythema.  No drainage.  Neurological Examination: Protective sensation intact with Semmes-Weinstein 10 gram monofilament b/l LE. Vibratory sensation intact b/l LE.      No data to display           Assessment/Plan: 1. Ingrown toenail of both feet     No orders of the defined types were placed in this encounter.   Discussed patient's condition today.  After obtaining patient consent, the left and right hallux was anesthetized with a 50:50 mixture of 1% lidocaine plain and 0.5% bupivacaine plain for a total of 3cc's administered to each toe.  Upon confirmation of anesthesia, a freer elevator was utilized to free the medial nail border from the nail bed on each toe.  The nail border  was then avulsed proximal to the eponychium and removed in toto.  The area was inspected for any remaining spicules.  A chemical matrixectomy was performed with NaOH and neutralized with acetic acid solution.  Antibiotic ointment and a DSD were applied, followed by a Coban dressing.  Patient tolerated the anesthetic and procedure well and will f/u in 2-3 weeks for recheck.  Patient given post-procedure instructions for daily 15-minute Epsom salt soaks, antibiotic ointment and daily use of Bandaids until toe starts to dry / form eschar.    Return in about 2 weeks (around 05/03/2023) for Nail Check.   Nycholas Rayner L. Marchia Bond, AACFAS [facility]     [address] [place], [place] [address]                Office [phone]  Fax [phone]

## 2023-05-02 DIAGNOSIS — R29898 Other symptoms and signs involving the musculoskeletal system: Secondary | ICD-10-CM | POA: Insufficient documentation

## 2023-05-03 ENCOUNTER — Ambulatory Visit (INDEPENDENT_AMBULATORY_CARE_PROVIDER_SITE_OTHER): Admitting: Podiatry

## 2023-05-03 ENCOUNTER — Encounter: Payer: Self-pay | Admitting: Podiatry

## 2023-05-03 DIAGNOSIS — L600 Ingrowing nail: Secondary | ICD-10-CM | POA: Diagnosis not present

## 2023-05-06 NOTE — Progress Notes (Signed)
       Subjective:  Patient ID: Lauren Moore, female    DOB: [birth year],  MRN: [id number]  Chief Complaint  Patient presents with   Nail Problem    "They're sore."    Lauren Moore presents to clinic today for f/u of PNA to the bilateral hallux medial nail borders.  She does report some mild soreness.  Overall this has improved from her previous visit.  PCP is Barnie Mort, PA-C.  No Known Allergies  Objective:  There were no vitals filed for this visit.  Vascular Examination: Capillary refill time is less than 3 seconds to toes bilateral. Palpable pedal pulses b/l LE. Digital hair present b/l. No pedal edema b/l. Skin temperature gradient WNL b/l. No varicosities b/l. No cyanosis or clubbing noted b/l.   Dermatological Examination: Upon inspection of the PNA site, there are no clinical signs of infection.  No purulence, no necrosis, no malodor present.  Minimal to no erythema present.  Eschar formed along nail margin.  Minimal to no pain on palpation of area.   Assessment/Plan: 1. Ingrown toenail of both feet     No orders of the defined types were placed in this encounter.  Findings discussed with patient.  Instructed patient to leave scabs intact to the medial borders of both great toes.  If they become loose or painful she may gently clean at them with Q-tip dipped in rubbing alcohol.  Patient ambulates with a walker and has difficulty maintaining her toenails due to her mobility.  Routine footcare offered to patient.  Return in about 3 months (around 08/02/2023) for Routine Foot Care.   Dolly Harbach L. Marchia Bond, AACFAS [facility]     [address] [place], [place] [address]                Office [phone]  Fax [phone]

## 2023-08-09 ENCOUNTER — Encounter: Payer: Self-pay | Admitting: Podiatry

## 2023-08-09 ENCOUNTER — Ambulatory Visit (INDEPENDENT_AMBULATORY_CARE_PROVIDER_SITE_OTHER): Admitting: Podiatry

## 2023-08-09 DIAGNOSIS — B351 Tinea unguium: Secondary | ICD-10-CM | POA: Diagnosis not present

## 2023-08-09 DIAGNOSIS — M79674 Pain in right toe(s): Secondary | ICD-10-CM | POA: Diagnosis not present

## 2023-08-09 DIAGNOSIS — M79675 Pain in left toe(s): Secondary | ICD-10-CM

## 2023-08-09 NOTE — Progress Notes (Unsigned)
  Subjective:  Patient ID: Lauren Moore, female    DOB: April 11, 1960,  MRN: [id number]  Chief Complaint  Patient presents with   RFC     RFC non diabetic toenail trim.    63 y.o. female presents with the above complaint. History confirmed with patient. Patient presenting with pain related to dystrophic thickened elongated nails. Patient is unable to trim own nails related to nail dystrophy and mobility issues. Patient does not have a history of T2DM.  Objective:  Physical Exam: warm, good capillary refill nail exam {:315758} {pod dm exam:23670::DP pulses palpable,PT pulses palpable,protective sensation intact} Left Foot:  Pain with palpation of nails due to elongation and dystrophic growth.  Right Foot: Pain with palpation of nails due to elongation and dystrophic growth.   Assessment:   1. Pain due to onychomycosis of toenails of both feet      Plan:  Patient was evaluated and treated and all questions answered.  #Hyperkeratotic lesions/pre ulcerative calluses present *** All symptomatic hyperkeratoses x *** separate lesions were safely debrided with a sterile #10 blade to patient's level of comfort without incident. We discussed preventative and palliative care of these lesions including supportive and accommodative shoegear, padding, prefabricated and custom molded accommodative orthoses, use of a pumice stone and lotions/creams daily.  #Onychomycosis with pain  -Nails palliatively debrided as below. -Educated on self-care  Procedure: Nail Debridement Rationale: Pain Type of Debridement: manual, sharp debridement. Instrumentation: Nail nipper, rotary burr. Number of Nails: {NUMBER 1-10:22536}  Return in about 3 months (around 11/09/2023) for Routine Foot Care.         Ethan Saddler, DPM [facility]                    Nails rfc

## 2023-10-17 ENCOUNTER — Ambulatory Visit
Admission: RE | Admit: 2023-10-17 | Discharge: 2023-10-17 | Disposition: A | Discharge status: Home / Self Care | Source: Ambulatory Visit

## 2023-10-17 ENCOUNTER — Ambulatory Visit (INDEPENDENT_AMBULATORY_CARE_PROVIDER_SITE_OTHER)

## 2023-10-17 VITALS — BP 138/76 | Ht 64.0 in | Wt 282.0 lb

## 2023-10-17 DIAGNOSIS — G8929 Other chronic pain: Secondary | ICD-10-CM

## 2023-10-17 DIAGNOSIS — W101XXA Fall (on)(from) sidewalk curb, initial encounter: Secondary | ICD-10-CM | POA: Diagnosis not present

## 2023-10-17 DIAGNOSIS — M25561 Pain in right knee: Secondary | ICD-10-CM | POA: Diagnosis not present

## 2023-10-17 MED ORDER — MELOXICAM 15 MG PO TABS
15.0000 mg | ORAL_TABLET | Freq: Every morning | ORAL | 0 refills | Status: AC
Start: 2023-10-17 — End: 2023-10-24

## 2023-10-17 NOTE — Progress Notes (Addendum)
 PCP: Celestia Harder, NP  Subjective:   HPI: Patient is a 63 y.o. female here for right knee pain.  Previously saw Dr. Arvell at our sports medicine clinic in 2023 for shoulder pain and mild degenerative changes of bilateral knees.  She states that overall her knees have been doing fairly well over the last year and a half and she has not required any daily pain medications or additional treatment.  She states 2 weeks ago she was walking to her mailbox and experienced a hard fall on her right knee.  Since then she has had significant pain with walking and prolonged standing.  Denies any swelling or bruising after fall.  Denies any lacerations.  Just endorses pain with ADLs.  Past Medical History:  Diagnosis Date   Arthritis    Brain injury (HCC)    Dyspnea    Hypertension    Obesity    Sleep apnea    cpap    Current Outpatient Medications on File Prior to Visit  Medication Sig Dispense Refill   albuterol  (VENTOLIN  HFA) 108 (90 Base) MCG/ACT inhaler Inhale 1-2 puffs into the lungs every 6 (six) hours as needed for wheezing or shortness of breath. (Patient taking differently: Inhale 1-2 puffs into the lungs 2 (two) times daily.) 8 g 0   No current facility-administered medications on file prior to visit.    Past Surgical History:  Procedure Laterality Date   BRAIN SURGERY     COLONOSCOPY N/A 06/26/2012   Procedure: COLONOSCOPY;  Surgeon: Norleen JAYSON Hint, MD;  Location: [facility];  Service: Endoscopy;  Laterality: N/A;   REVERSE SHOULDER ARTHROPLASTY Left 07/25/2022   Procedure: LEFT REVERSE SHOULDER ARTHROPLASTY;  Surgeon: Cristy Bonner DASEN, MD;  Location: [facility];  Service: Orthopedics;  Laterality: Left;   TYMPANOPLASTY Right 11/25/2017   Procedure: TYMPANOPLASTY;  Surgeon: Jesus Oliphant, MD;  Location: [facility];  Service: ENT;  Laterality: Right;    No Known Allergies  BP 138/76   Ht 5' 4 (1.626 m)   Wt 282 lb (127.9 kg)   BMI 48.41 kg/m       No data to display               No data to display              Objective:  Physical Exam:  Gen: NAD, comfortable in exam room  On examination of patient's knee no signs of erythema, ecchymoses, mild right knee effusion.  Moderate tenderness to palpation over medial joint line, no significant tenderness over patella, moderate tenderness over tibial tuberosity.  Patient has full passive and active range of motion, some pain with full flexion. Strength 4/5 in all planes of motion secondary to pain.  Patient neurovascularly intact distally.  Negative anterior/posterior drawer test.  Negative Lachman's.  Negative varus valgus stress testing.  Negative McMurray's test.     Assessment & Plan:   #Trauma to right knee s/p fall #Right knee osteoarthritis -Patient has had significant increase in right knee pain after fall on pavement 2 weeks ago.  Trouble with ambulation and ADLs.  On exam no obvious signs of fracture.  Suspect patient has bone bruising with acute aggravation of chronic osteoarthritis however will first send patient for updated knee x-rays prior to any interventional treatments to ensure no tibial plateau fracture or occult fracture. -Assuming x-ray is negative for fractures, will return tomorrow for intra-articular cortisone injection -Will also fit patient for compression knee brace today -Instructed to ice  the knee for pain relief -Will send in 1 week of meloxicam  for anti-inflammatory, denies any ASCVD or GI bleeding -Patient agrees to treatment plan no further questions or concerns at this time

## 2023-10-18 ENCOUNTER — Ambulatory Visit (INDEPENDENT_AMBULATORY_CARE_PROVIDER_SITE_OTHER): Admitting: Family Medicine

## 2023-10-18 ENCOUNTER — Encounter: Payer: Self-pay | Admitting: Family Medicine

## 2023-10-18 VITALS — BP 130/90 | Ht 64.0 in | Wt 238.0 lb

## 2023-10-18 DIAGNOSIS — M1711 Unilateral primary osteoarthritis, right knee: Secondary | ICD-10-CM | POA: Diagnosis not present

## 2023-10-18 DIAGNOSIS — M25561 Pain in right knee: Secondary | ICD-10-CM | POA: Diagnosis not present

## 2023-10-18 MED ORDER — METHYLPREDNISOLONE ACETATE 40 MG/ML IJ SUSP
40.0000 mg | Freq: Once | INTRAMUSCULAR | Status: AC
  Administered 2023-10-18: 40 mg via INTRA_ARTICULAR

## 2023-10-18 NOTE — Progress Notes (Signed)
  Lauren Moore - 63 y.o. female MRN [id number]  Date of birth: [birth year]    SUBJECTIVE:      Chief Complaint:/ HPI:   Right knee pain Seen yesterday by  Dr. Lynwood and had considered corticosteroid injection but felt the necessity to get x-rays first.   She fell 2 weeks ago onti her right knee and has had significant pain with walking since then. Has been able to ambulate but pain not improving. No bruising, no locking, no giving way.  Has never had corticosteroid injection of her knee.  She has had the x-rays done.  She returns today for further evaluation and management of knee pain, consideration of possible corticosteroid injection  PERTINENT  PMH / PSH: I have reviewed the patient's medications, allergies, past medical and surgical history, smoking status.  Pertinent findings that relate to today's visit / issues include: Hx thrombocytopenia and alcohol use Platelets 190 June 2024. RESULTS: Formal x-ray read is not complete yet.   My personal interpretation of the images: AP lateral and sunrise X-ray right knee: reveals moderate joint space narrowing   greatest in the lateral compartment of the knee,  some osteophytes noted at the lateral joint line, large Stieda Pellegrini process medially. Over the medial collateral ligament area. No acute findings, specifically no fracture, no dislocation..   OBJECTIVE: BP (!) 130/90   Ht 5' 4 (1.626 m)   Wt 238 lb (108 kg)   BMI 40.85 kg/m   Physical Exam:  Vital signs are reviewed. BMI 40 GENERAL: Well-developed no acute distress KNEES: Symmetrical.  The right knee is tender to palpation over the medial joint line.  She has full range of motion flexion extension.  Popliteal space is benign.  PROCEDURE: INJECTION: Patient was given informed consent, signed copy in the chart. Appropriate time out was taken. Area prepped and draped in usual sterile fashion. Ethyl chloride was  used for local anesthesia. A 21 gauge 1 1/2 inch needle was  used..  1 cc of methylprednisolone  40 mg/ml plus 4 cc of 1% lidocaine  without epinephrine  was injected into the right knee using a(n) anterior medial approach.   The patient tolerated the procedure well. There were no complications. Post procedure instructions were given.   ASSESSMENT & PLAN:  See problem based charting & AVS for pt instructions. Primary osteoarthritis of right knee Showed her the x-rays and gave her my preliminary read.  I will send her a copy of the report once it is finalized.   Corticosteroid injection into the right knee today which she tolerated well.   She will give me a phone call in 7 to 10 days with follow-up and call in the interim with any worrisome symptoms.

## 2023-10-18 NOTE — Assessment & Plan Note (Addendum)
 Showed her the x-rays and gave her my preliminary read.  I will send her a copy of the report once it is finalized.   Corticosteroid injection into the right knee today which she tolerated well.   She will give me a phone call in 7 to 10 days with follow-up and call in the interim with any worrisome symptoms.

## 2023-10-23 ENCOUNTER — Ambulatory Visit: Payer: Self-pay

## 2023-10-24 ENCOUNTER — Other Ambulatory Visit: Payer: Self-pay

## 2023-10-24 DIAGNOSIS — M25561 Pain in right knee: Secondary | ICD-10-CM

## 2023-10-26 ENCOUNTER — Ambulatory Visit
Admission: RE | Admit: 2023-10-26 | Discharge: 2023-10-26 | Disposition: A | Discharge status: Home / Self Care | Source: Ambulatory Visit

## 2023-10-26 DIAGNOSIS — M25561 Pain in right knee: Secondary | ICD-10-CM

## 2023-10-26 MED ORDER — GADOPICLENOL 0.5 MMOL/ML IV SOLN
10.0000 mL | Freq: Once | INTRAVENOUS | Status: AC | PRN
  Administered 2023-10-26: 10 mL via INTRAVENOUS

## 2023-10-29 ENCOUNTER — Ambulatory Visit: Payer: Self-pay

## 2023-10-31 ENCOUNTER — Ambulatory Visit

## 2023-10-31 VITALS — BP 132/74 | Ht 64.0 in | Wt 238.0 lb

## 2023-10-31 DIAGNOSIS — M1711 Unilateral primary osteoarthritis, right knee: Secondary | ICD-10-CM | POA: Diagnosis not present

## 2023-10-31 MED ORDER — MELOXICAM 15 MG PO TABS: 15.0000 mg | ORAL_TABLET | Freq: Every day | ORAL | 0 refills | Status: AC

## 2023-10-31 NOTE — Progress Notes (Cosign Needed)
 PCP: Celestia Harder, NP  Subjective:   HPI: Patient is a 63 y.o. female here for follow up of right knee pain after a fall sustained roughly 1 month ago.  She saw myself on 10/17/2023 and we sent her for x-ray to ensure no fractures present.  No fractures were present and she had subsequent cortisone injection into her right knee by Dr. Aureliano the following day on 919.  Patient says she only got roughly 24 hours of relief after injection.  Endorsing pain in all 3 compartments of her right knee, medial, lateral, anterior.  X-ray did show osseous excrescence extending medially from the medial cortex of the medial femoral condyle so we sent her for MRI for further evaluation to ensure no neoplastic process occurring.  MRI confirmed large osteophyte versus benign exostosis of medial femoral condyle with no aggressive features or suspicious enhancement.  Not currently taking any over-the-counter medication for pain relief.  Not currently wearing any knee sleeve or brace.  She says her knee bothers her whenever she tries to use it anyway.  Denies any further trauma to area since initial fall.  Past Medical History:  Diagnosis Date   Arthritis    Brain injury (HCC)    Dyspnea    Hypertension    Obesity    Sleep apnea    cpap    Current Outpatient Medications on File Prior to Visit  Medication Sig Dispense Refill   albuterol  (VENTOLIN  HFA) 108 (90 Base) MCG/ACT inhaler Inhale 1-2 puffs into the lungs every 6 (six) hours as needed for wheezing or shortness of breath. (Patient taking differently: Inhale 1-2 puffs into the lungs 2 (two) times daily.) 8 g 0   No current facility-administered medications on file prior to visit.    Past Surgical History:  Procedure Laterality Date   BRAIN SURGERY     COLONOSCOPY N/A 06/26/2012   Procedure: COLONOSCOPY;  Surgeon: Norleen JAYSON Hint, MD;  Location: [facility];  Service: Endoscopy;  Laterality: N/A;   REVERSE SHOULDER ARTHROPLASTY Left 07/25/2022   Procedure:  LEFT REVERSE SHOULDER ARTHROPLASTY;  Surgeon: Cristy Bonner DASEN, MD;  Location: [facility];  Service: Orthopedics;  Laterality: Left;   TYMPANOPLASTY Right 11/25/2017   Procedure: TYMPANOPLASTY;  Surgeon: Jesus Oliphant, MD;  Location: [facility];  Service: ENT;  Laterality: Right;    No Known Allergies  BP 132/74   Ht 5' 4 (1.626 m)   Wt 238 lb (108 kg)   BMI 40.85 kg/m       No data to display              No data to display              Objective:  Physical Exam:  Gen: NAD, comfortable in exam room  On examination of patient's knee no signs of erythema, ecchymoses, mild right knee effusion.  Moderate tenderness to palpation over medial and lateral joint line, no significant tenderness over patella, moderate tenderness over tibial tuberosity.  Patient has full passive and active range of motion, some pain with full flexion. Strength 4/5 in all planes of motion secondary to pain.  Patient neurovascularly intact distally.  Negative anterior/posterior drawer test.  Negative Lachman's.  Negative varus valgus stress testing.  Negative McMurray's test.    Assessment & Plan:   #Right knee tricompartment osteoarthritis - Did not get adequate relief with corticosteroid intra-articular knee injection, says relief only lasted 24 hours or less - Will send patient 1 month of meloxicam   which she has to take every morning with food for the next 2 weeks and take thereafter as needed if helpful.  Goal of decreasing pain from 10/10 to 5/10.  Discussed risk/benefits of rx given distant hx of rectal bleeding, patient not concerned and would like to try medication  - Can try to buy larger compression sleeve off [employer] for additional support in meantime - Can also apply ice to knee for 20 minutes up to 3 times daily for additional pain relief - Will send patient to physical therapy for quadriceps/hamstring strengthening and attempt to offload pressure from knee - Will send referral to  Dr. Jerri with [facility] for consultation regarding knee replacement.  Patient sister who is with her during exam today had great experience after knee replacements by Dr. Jerri and patient is open to surgery down the road if not making improvement with addition of conservative measures.

## 2023-11-01 ENCOUNTER — Ambulatory Visit (INDEPENDENT_AMBULATORY_CARE_PROVIDER_SITE_OTHER): Admitting: Podiatry

## 2023-11-01 ENCOUNTER — Encounter: Payer: Self-pay | Admitting: Podiatry

## 2023-11-01 DIAGNOSIS — M79674 Pain in right toe(s): Secondary | ICD-10-CM

## 2023-11-01 DIAGNOSIS — L601 Onycholysis: Secondary | ICD-10-CM

## 2023-11-01 DIAGNOSIS — M79675 Pain in left toe(s): Secondary | ICD-10-CM | POA: Diagnosis not present

## 2023-11-01 DIAGNOSIS — B351 Tinea unguium: Secondary | ICD-10-CM | POA: Diagnosis not present

## 2023-11-01 NOTE — Patient Instructions (Signed)

## 2023-11-01 NOTE — Progress Notes (Signed)
  Subjective:  Patient ID: Lauren Moore, female    DOB: 09-Sep-1960,  MRN: [id number]  Chief Complaint  Patient presents with   Nail Problem    Bilateral great toe nail pain medial sides. 2 weeks.  Not diabetic.  No anti coag      63 y.o. female presents with the above complaint. History confirmed with patient. Patient presenting with pain related to dystrophic thickened elongated nails. Patient is unable to trim own nails related to nail dystrophy and mobility issues. Patient does not have a history of T2DM.  Right first toenail specially painful.  Objective:  Physical Exam: warm, good capillary refill nail exam onychomycosis of the toenails and dystrophic nails DP pulses palpable, PT pulses palpable, and protective sensation intact Left Foot:  Pain with palpation of nails due to elongation and dystrophic growth.  Right Foot: Pain with palpation of nails due to elongation and dystrophic growth.  Right first nail plate some degree of nail plate separation, is tender on palpation.  Tenderness on right first medial border with localized inflammation without evidence of ingrowing nail  Assessment:   1. Pain due to onychomycosis of toenails of both feet   2. Nail plate separation      Plan:  Patient was evaluated and treated and all questions answered.  #Onychomycosis with pain  -Nails palliatively debrided as below. -Educated on self-care  Procedure: Nail Debridement Rationale: Pain Type of Debridement: manual, sharp debridement. Instrumentation: Nail nipper, rotary burr. Number of Nails: 10  # Nail plate separation right first toe - Did trim back majority of loose toenail today - Monitor for signs of infection - If pain is ongoing recheck in 2 to 3 weeks for possible total nail avulsion - Epsom salt soaks and antibiotic ointment to help keep skin soft in the interim. -I certify that this diagnosis represents a distinct and separate diagnosis that requires evaluation  and treatment separate from other procedures or diagnosis    Return in about 3 months (around 02/01/2024) for Routine Foot Care.         Ethan Saddler, DPM [facility] / [facility]

## 2023-11-12 ENCOUNTER — Ambulatory Visit

## 2023-11-12 ENCOUNTER — Other Ambulatory Visit: Payer: Self-pay

## 2023-11-12 DIAGNOSIS — G8929 Other chronic pain: Secondary | ICD-10-CM | POA: Diagnosis present

## 2023-11-12 DIAGNOSIS — M1711 Unilateral primary osteoarthritis, right knee: Secondary | ICD-10-CM | POA: Diagnosis not present

## 2023-11-12 DIAGNOSIS — R2689 Other abnormalities of gait and mobility: Secondary | ICD-10-CM | POA: Diagnosis present

## 2023-11-12 DIAGNOSIS — M25561 Pain in right knee: Secondary | ICD-10-CM | POA: Diagnosis present

## 2023-11-12 DIAGNOSIS — M6281 Muscle weakness (generalized): Secondary | ICD-10-CM | POA: Diagnosis present

## 2023-11-12 NOTE — Therapy (Signed)
 OUTPATIENT PHYSICAL THERAPY LOWER EXTREMITY EVALUATION   Patient Name: Lauren Moore MRN: [id number] DOB:02-Jan-1961, 63 y.o., female Today's Date: 11/12/2023  END OF SESSION:  PT End of Session - 11/12/23 0852     Visit Number 1    Number of Visits 16    Date for Recertification  01/12/24    Authorization Type [employer]    PT Start Time 0800    PT Stop Time 0845    PT Time Calculation (min) 45 min    Activity Tolerance Patient tolerated treatment well          Past Medical History:  Diagnosis Date   Arthritis    Brain injury (HCC)    Dyspnea    Hypertension    Obesity    Sleep apnea    cpap   Past Surgical History:  Procedure Laterality Date   BRAIN SURGERY     COLONOSCOPY N/A 06/26/2012   Procedure: COLONOSCOPY;  Surgeon: Norleen JAYSON Hint, MD;  Location: [facility];  Service: Endoscopy;  Laterality: N/A;   REVERSE SHOULDER ARTHROPLASTY Left 07/25/2022   Procedure: LEFT REVERSE SHOULDER ARTHROPLASTY;  Surgeon: Cristy Bonner DASEN, MD;  Location: [facility];  Service: Orthopedics;  Laterality: Left;   TYMPANOPLASTY Right 11/25/2017   Procedure: TYMPANOPLASTY;  Surgeon: Jesus Oliphant, MD;  Location: [facility];  Service: ENT;  Laterality: Right;   Patient Active Problem List   Diagnosis Date Noted   Primary osteoarthritis of right knee 10/18/2023   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   OSA (obstructive sleep apnea) 01/02/2022   Need for shingles vaccine 10/30/2017   Need for Tdap vaccination 10/30/2017   Screening for colon cancer 10/30/2017   Screening for human immunodeficiency virus 10/30/2017   Need for hepatitis C screening test 10/30/2017   Pain of both hip joints 10/30/2017   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   Polyuria 10/30/2017   Weight gain 10/30/2017   Essential hypertension 10/30/2017   Other abnormal glucose 10/30/2017   Breast cancer screening by mammogram 10/30/2017   Conductive hearing  loss of right ear 09/26/2017   Perforation of right tympanic membrane 09/26/2017   Rectal bleed 06/24/2012   Hypokalemia 06/24/2012   Thrombocytopenia 06/24/2012   Alcohol abuse 06/24/2012   Elevated liver enzymes 06/24/2012    PCP: Celestia Harder, NP PCP - General   REFERRING PROVIDER: Martha Bouchard, LAT  REFERRING DIAG: M17.11 (ICD-10-CM) - Primary osteoarthritis of right knee  IMAGING: There is stable chronic either a large osteophyte from remote injury or benign exostosis of the medial femoral condyle near the insertion of the MCL. No aggressive features or suspicious enhancement. This is unchanged compared with x-ray from 2022.   Moderate tricompartmental osteoarthrosis most notably the lateral compartment. Chondromalacia and subchondral reactive edema. Small reactive joint effusion.   Horizontal signal in the posterior horn and posterior body of the lateral meniscus which does not definitively communicate with the articular surface. This could reflect an intrasubstance tear. No displaced meniscal tear is present.  THERAPY DIAG:  Chronic pain of right knee  Balance problems  Muscle weakness (generalized)  Rationale for Evaluation and Treatment: Rehabilitation  ONSET DATE: one year ago  SUBJECTIVE:   SUBJECTIVE STATEMENT: Pt reports being in pain for over a year due to falling 2x. Says walking and balancing has been difficult lately. PERTINENT HISTORY: Fall risk, can't walk with cane bc of tripping PAIN:  10/10W 8/10 B 5/10C  Are you having pain? Yes: NPRS scale:  10 Pain location: R knee Pain description: achy like a toothache Aggravating factors: walking for too long Relieving factors: topical cream  PRECAUTIONS: None  RED FLAGS: None   WEIGHT BEARING RESTRICTIONS: No  FALLS:  Has patient fallen in last 6 months? No     PLOF: Independent with basic ADLs  PATIENT GOALS: walk better, decrease pain, improve balance    OBJECTIVE:  Note:  Objective measures were completed at Evaluation unless otherwise noted.    PATIENT SURVEYS:  Modified Oswestry:  MODIFIED OSWESTRY DISABILITY SCALE  Date: 10/14 Score  Pain intensity   2. Personal care (washing, dressing, etc.)   3. Lifting   4. Walking   5. Sitting   6. Standing   7. Sleeping   8. Social Life   9. Traveling   10. Employment/ Homemaking   Total 40/50   Interpretation of scores: Score Category Description  0-20% Minimal Disability The patient can cope with most living activities. Usually no treatment is indicated apart from advice on lifting, sitting and exercise  21-40% Moderate Disability The patient experiences more pain and difficulty with sitting, lifting and standing. Travel and social life are more difficult and they may be disabled from work. Personal care, sexual activity and sleeping are not grossly affected, and the patient can usually be managed by conservative means  41-60% Severe Disability Pain remains the main problem in this group, but activities of daily living are affected. These patients require a detailed investigation  61-80% Crippled Back pain impinges on all aspects of the patient's life. Positive intervention is required  81-100% Bed-bound These patients are either bed-bound or exaggerating their symptoms  Bluford FORBES Zoe DELENA Karon DELENA, et al. Surgery versus conservative management of stable thoracolumbar fracture: the PRESTO feasibility RCT. [place] ([place]): [employer]; 2021 Nov[facility] Technology Assessment, No. 25.62.) Appendix 3, Oswestry Disability Index category descriptors. Available from: FindJewel[employer].cz  Minimally Clinically Important Difference (MCID) = 12.8%  COGNITION: Overall cognitive status: Within functional limits for tasks assessed     SENSATION: WFL   POSTURE: rounded shoulders and forward head  PALPATION: TTP R distal quad, hamstring, gastroc, anterior  knee/patella  LOWER EXTREMITY ROM:  Active ROM Right eval Left eval  Hip flexion    Hip extension    Hip abduction    Hip adduction    Hip internal rotation    Hip external rotation    Knee flexion 90 wfl  Knee extension 0 wfl  Ankle dorsiflexion    Ankle plantarflexion    Ankle inversion    Ankle eversion     (Blank rows = not tested)  LOWER EXTREMITY MMT:   *Difficulty with gait (slow and antalgic) alongside min A with STS from eval room chair*   MMT Right eval Left eval  Hip flexion 3+ 3+  Hip extension    Hip abduction 4- 4-  Hip adduction 4- 4-  Hip internal rotation    Hip external rotation    Knee flexion 4- 4-  Knee extension 4- 4+  Ankle dorsiflexion 4- 4  Ankle plantarflexion 4+ 4+  Ankle inversion    Ankle eversion     (Blank rows = not tested)  TREATMENT DATE:   TREATMENT 11/12/2023:  Therapeutic Exercise: - seated calf raise 2x8x3s -seated LAQ 2x8x3s -seated partial hamstring curl 2x8x3s Patient educated on HEP, POC, prognosis, and relevant tissues/anatomy.   Manual Therapy: - STM R distal quad, hamstring, and gastroc    PATIENT EDUCATION:  Education details: HEP Person educated: Patient Education method: Programmer, multimedia, Demonstration, Verbal cues, and Handouts Education comprehension: verbalized understanding and returned demonstration  HOME EXERCISE PROGRAM: Access Code: [email or web address] URL: [email or web address] Date: 11/12/2023 Prepared by: Washington Scot  Exercises - Seated Heel Raise  - 2 x daily - 5 x weekly - 2 sets - 8 reps - 3 hold - Seated Long Arc Quad  - 2 x daily - 5 x weekly - 2 sets - 8 reps - 3 hold - Seated Knee Flexion  - 2 x daily - 5 x weekly - 2 sets - 8 reps - 3 hold  ASSESSMENT:  CLINICAL IMPRESSION: Patient is a 63 year old female who presents with chronic R knee  pain due to osteoarthritis. Patient presents with deficits in: general lower body weakness, balance, excessive pain, and activity tolerance. As a result, the patient would benefit from skilled PT to address aforementioned deficits via plan below.   OBJECTIVE IMPAIRMENTS: Abnormal gait, decreased balance, difficulty walking, decreased ROM, decreased strength, obesity, and pain.   ACTIVITY LIMITATIONS: lifting, sitting, and squatting    PERSONAL FACTORS: Age, Fitness, and 3+ comorbidities:   are also affecting patient's functional outcome.   REHAB POTENTIAL: Fair    CLINICAL DECISION MAKING: Stable/uncomplicated  EVALUATION COMPLEXITY: Moderate   GOALS: Goals reviewed with patient? No  SHORT TERM GOALS: Target date: 12/03/2023   Patient will demonstrate 75% HEP compliance to show independence with self-management of condition  Baseline: 0% Goal status: INITIAL  2.  Patient will decrease worst pain to 8/10 at most to improve ADL completion and overall QOL  Baseline: 10/10 Goal status: INITIAL      LONG TERM GOALS: Target date: 01/12/24  Patient will demonstrate a 9 point improvement in ODI to show improvements in ADL completion and overall QOL   Baseline:  Goal status: INITIAL  2.  Patient will decrease worst pain to 6/10 at most to improve ADL completion and overall QOL  Baseline: 10/10 Goal status: INITIAL  3.  Patient will demonstrate 100% HEP compliance to show independence with self-management of condition  Baseline: 0% Goal status: INITIAL    4. Patient will be able to report balance is at least 75% capacity to demonstrate improvements in lower body ADL's and gait  Baseline: 74% Goal status: INITIAL     PLAN:  PT FREQUENCY: 1-2x/week  PT DURATION: 8 weeks  PLANNED INTERVENTIONS: 97110-Therapeutic exercises, 97530- Therapeutic activity, 97112- Neuromuscular re-education, 97535- Self Care, [id number]- Manual therapy, [phone]- Gait training,  Patient/Family education, Balance training, and Stair training  PLAN FOR NEXT SESSION: HEP assessment and progression, symptom modulation, and loading (isolated and/or functional). Manual therapy, aerobic, gait, and NME training as needed.     Washington Odessia Scot  PT, DPT

## 2023-11-14 ENCOUNTER — Ambulatory Visit (INDEPENDENT_AMBULATORY_CARE_PROVIDER_SITE_OTHER): Admitting: Orthopaedic Surgery

## 2023-11-14 ENCOUNTER — Encounter: Payer: Self-pay | Admitting: Orthopaedic Surgery

## 2023-11-14 VITALS — Ht 64.0 in | Wt 284.0 lb

## 2023-11-14 DIAGNOSIS — Z6841 Body Mass Index (BMI) 40.0 and over, adult: Secondary | ICD-10-CM | POA: Diagnosis not present

## 2023-11-14 DIAGNOSIS — M84451A Pathological fracture, right femur, initial encounter for fracture: Secondary | ICD-10-CM | POA: Diagnosis not present

## 2023-11-14 DIAGNOSIS — M84469A Pathological fracture, unspecified tibia and fibula, initial encounter for fracture: Secondary | ICD-10-CM | POA: Insufficient documentation

## 2023-11-14 NOTE — Progress Notes (Signed)
 Office Visit Note   Patient: Lauren Moore           Date of Birth: Mar 05, 1960           MRN: [id number] Visit Date: 11/14/2023              Requested by: Lynwood Barter, DO [address] [place],  [place] [address] PCP: Celestia Harder, NP   Assessment & Plan: Visit Diagnoses:  1. Closed subchondral insufficiency fracture of condyle of right femur (HCC)   2. Insufficiency fracture of tibia, initial encounter   3. Body mass index 45.0-49.9, adult (HCC)     Plan: History of Present Illness Lauren Moore is a 63 year old female who presents with right knee pain for at least 3 months. She is accompanied by her sister Ronal Dustman. She was referred by Dr. Lynwood for further evaluation and management of her knee pain.  She has experienced right knee pain for approximately three months, which has not improved with cortisone injections. She is currently undergoing physical therapy, having had her first session recently. Recent weight gain to 285 pounds may be contributing to her knee issues. She does not use blood thinners.  Physical Exam MEASUREMENTS: Weight- 284. MUSCULOSKELETAL: Right knee with mild effusion.  Pain with palpation on lateral side of the knee and joint line.    Assessment and Plan Right knee subchondral insufficiency fractures of lateral femoral condyle and lateral tibial plateau Moderate osteoarthritis with cartilage degeneration. Cortisone injections ineffective. Physical therapy started.  I have recommended knee scope with chondroplasty, evaluation of menisci and subchondroplasty of lateral femoral condyle and lateral tibial plateau. - detailed surgical plan discussed.  Obesity contributing to right knee osteoarthritis Weight at 285 pounds exacerbates knee pain and stress reaction. Weight loss essential to alleviate symptoms and avoid future knee replacement. - Advise on weight loss to alleviate knee symptoms and reduce the risk of future knee  replacement.  Follow-Up Instructions: No follow-ups on file.   Orders:  No orders of the defined types were placed in this encounter.  No orders of the defined types were placed in this encounter.     Procedures: No procedures performed   Clinical Data: No additional findings.   Subjective: Chief Complaint  Patient presents with   Right Knee - Pain    HPI  Review of Systems  Constitutional: Negative.   HENT: Negative.    Eyes: Negative.   Respiratory: Negative.    Cardiovascular: Negative.   Endocrine: Negative.   Musculoskeletal: Negative.   Neurological: Negative.   Hematological: Negative.   Psychiatric/Behavioral: Negative.    All other systems reviewed and are negative.    Objective: Vital Signs: Ht 5' 4 (1.626 m)   Wt 284 lb (128.8 kg)   BMI 48.75 kg/m   Physical Exam Vitals and nursing note reviewed.  Constitutional:      Appearance: She is well-developed.  HENT:     Head: Atraumatic.     Nose: Nose normal.  Eyes:     Extraocular Movements: Extraocular movements intact.  Cardiovascular:     Pulses: Normal pulses.  Pulmonary:     Effort: Pulmonary effort is normal.  Abdominal:     Palpations: Abdomen is soft.  Musculoskeletal:     Cervical back: Neck supple.  Skin:    General: Skin is warm.     Capillary Refill: Capillary refill takes less than 2 seconds.  Neurological:     Mental Status: She is alert. Mental  status is at baseline.  Psychiatric:        Behavior: Behavior normal.        Thought Content: Thought content normal.        Judgment: Judgment normal.     Ortho Exam  Specialty Comments:  No specialty comments available.  Imaging: No results found.   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   OSA (obstructive sleep apnea) 01/02/2022   Need for shingles vaccine 10/30/2017   Need for Tdap vaccination 10/30/2017   Screening for colon cancer 10/30/2017   Screening for human immunodeficiency virus 10/30/2017   Need for hepatitis C screening test 10/30/2017   Pain of both hip joints 10/30/2017   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   Polyuria 10/30/2017   Weight gain 10/30/2017   Essential hypertension 10/30/2017   Other abnormal glucose 10/30/2017   Breast cancer screening by mammogram 10/30/2017   Conductive hearing loss of right ear 09/26/2017   Perforation of right tympanic membrane 09/26/2017   Rectal bleed 06/24/2012   Hypokalemia 06/24/2012   Thrombocytopenia 06/24/2012   Alcohol abuse 06/24/2012   Elevated liver enzymes 06/24/2012   Past Medical History:  Diagnosis Date   Arthritis    Brain injury (HCC)    Dyspnea    Hypertension    Obesity    Sleep apnea    cpap    Family History  Problem Relation Age of Onset   Cancer Sister        Lung   Hypertension Sister    Diabetes Sister    Cancer Brother        Lung   Stroke Brother    Hypertension Brother    Colon cancer Neg Hx    Colon polyps Neg Hx    Rectal cancer Neg Hx    Stomach cancer Neg Hx     Past Surgical History:  Procedure Laterality Date   BRAIN SURGERY     COLONOSCOPY N/A 06/26/2012   Procedure: COLONOSCOPY;  Surgeon: Norleen JAYSON Hint, MD;  Location: [facility];  Service: Endoscopy;  Laterality: N/A;   REVERSE SHOULDER ARTHROPLASTY Left 07/25/2022   Procedure: LEFT REVERSE SHOULDER ARTHROPLASTY;  Surgeon: Cristy Bonner DASEN, MD;  Location: [facility];  Service: Orthopedics;  Laterality: Left;   TYMPANOPLASTY Right 11/25/2017   Procedure: TYMPANOPLASTY;  Surgeon: Jesus Oliphant, MD;  Location: [facility];  Service: ENT;  Laterality: Right;   Social History   Occupational History    Not on file  Tobacco Use   Smoking status: Never   Smokeless tobacco: Never  Vaping Use   Vaping status: Never Used  Substance and Sexual Activity   Alcohol use: Yes    Comment: 4-5 beers per week   Drug use: Never    Comment: states not smoked in one year   Sexual activity: Not Currently

## 2023-11-19 ENCOUNTER — Encounter: Payer: Self-pay | Admitting: Pulmonary Disease

## 2023-11-19 ENCOUNTER — Ambulatory Visit (INDEPENDENT_AMBULATORY_CARE_PROVIDER_SITE_OTHER): Admitting: Pulmonary Disease

## 2023-11-19 VITALS — BP 126/82 | HR 68 | Ht 64.0 in | Wt 286.6 lb

## 2023-11-19 DIAGNOSIS — G4733 Obstructive sleep apnea (adult) (pediatric): Secondary | ICD-10-CM | POA: Diagnosis not present

## 2023-11-19 NOTE — Progress Notes (Signed)
 Lauren Moore    [id number]    May 13, 1960  Primary Care Physician:Edwards, Christopher, NP  Referring Physician: Leonce Carola PARAS, PA-C [address] [place],  [place] [address]  Chief complaint:   Patient being seen for shortness of breath, sleep disordered breathing  HPI:  She continues to use her CPAP on a nightly basis Doing well with it  Has no complaints about her CPAP  Sleep study was in 2019, had a repeat in 2023 showing severe obstructive sleep apnea On auto CPAP 5-20  Uses it every night  She is sleeping well, wakes up feeling like she has had a good nights rest Occasionally feels tired during the day and takes a nap  Usually goes to bed between 9 and 10 PM wakes up about 9 AM in the morning  She is limited with activities of daily living only able to walk about a block She will feel weak with exertion Does have a lot of knee pain with the knee giving out on once in a while  She is having surgery in a few days  Denies any significant dryness of her mouth in the morning Occasional headaches No chest pains or chest discomfort Memory is fair  Reformed smoker quit over 10 years ago was smoking only a few cigarettes a day  Outpatient Encounter Medications as of 11/19/2023  Medication Sig   albuterol  (VENTOLIN  HFA) 108 (90 Base) MCG/ACT inhaler Inhale 1-2 puffs into the lungs every 6 (six) hours as needed for wheezing or shortness of breath.   meloxicam  (MOBIC ) 15 MG tablet Take 1 tablet (15 mg total) by mouth daily.   No facility-administered encounter medications on file as of 11/19/2023.    Allergies as of 11/19/2023   (No Known Allergies)    Past Medical History:  Diagnosis Date   Arthritis    Brain injury (HCC)    Dyspnea    Hypertension    Obesity    Sleep apnea    cpap    Past Surgical History:  Procedure Laterality Date   BRAIN SURGERY     COLONOSCOPY N/A 06/26/2012   Procedure: COLONOSCOPY;  Surgeon: Norleen JAYSON Hint, MD;  Location: [facility];  Service: Endoscopy;  Laterality: N/A;   REVERSE SHOULDER ARTHROPLASTY Left 07/25/2022   Procedure: LEFT REVERSE SHOULDER ARTHROPLASTY;  Surgeon: Cristy Bonner DASEN, MD;  Location: [facility];  Service: Orthopedics;  Laterality: Left;   TYMPANOPLASTY Right 11/25/2017   Procedure: TYMPANOPLASTY;  Surgeon: Jesus Oliphant, MD;  Location: [facility];  Service: ENT;  Laterality: Right;    Family History  Problem Relation Age of Onset   Cancer Sister        Lung   Hypertension Sister    Diabetes Sister    Cancer Brother        Lung   Stroke Brother    Hypertension Brother    Colon cancer Neg Hx    Colon polyps Neg Hx    Rectal cancer Neg Hx    Stomach cancer Neg Hx     Social History   Socioeconomic History   Marital status: Single    Spouse name: Not on file   Number of children: Not on file   Years of education: Not on file   Highest education level: Not on file  Occupational History   Not on file  Tobacco Use   Smoking status: Never   Smokeless tobacco: Never  Vaping Use   Vaping status: Never Used  Substance and Sexual Activity   Alcohol use: Yes    Comment: 4-5 beers per week   Drug use: Never    Comment: states not smoked in one year   Sexual activity: Not Currently  Other Topics Concern   Not on file  Social History Narrative   ** Merged History Encounter **       Lives in boarding house with friends   Social Drivers of Health   Financial Resource Strain: Low Risk  (09/04/2023)   Received from [facility]   Overall Financial Resource Strain (CARDIA)    How hard is it for you to pay for the very basics like food, housing, medical care, and heating?: Not very hard  Food Insecurity: No Food Insecurity (09/04/2023)   Received from [facility]   Hunger Vital Sign    Within the past 12 months, you worried that your food would run out before you got the money to buy more.: Never true    Within the past 12 months, the food  you bought just didn't last and you didn't have money to get more.: Never true  Transportation Needs: No Transportation Needs (09/04/2023)   Received from [facility] - Transportation    In the past 12 months, has lack of transportation kept you from medical appointments or from getting medications?: No    In the past 12 months, has lack of transportation kept you from meetings, work, or from getting things needed for daily living?: No  Physical Activity: Inactive (09/04/2023)   Received from [facility]   Exercise Vital Sign    On average, how many days per week do you engage in moderate to strenuous exercise (like a brisk walk)?: 0 days    Minutes of Exercise per Session: Not on file  Stress: No Stress Concern Present (09/04/2023)   Received from [facility] - Occupational Stress Questionnaire    Do you feel stress - tense, restless, nervous, or anxious, or unable to sleep at night because your mind is troubled all the time - these days?: Not at all  Social Connections: Socially Integrated (09/04/2023)   Received from [facility]   Social Network    How would you rate your social network (family, work, friends)?: Good participation with social networks  Intimate Partner Violence: Not At Risk (09/04/2023)   Received from [facility]   HITS    Over the last 12 months how often did your partner physically hurt you?: Never    Over the last 12 months how often did your partner insult you or talk down to you?: Never    Over the last 12 months how often did your partner threaten you with physical harm?: Never    Over the last 12 months how often did your partner scream or curse at you?: Never    Review of Systems  Constitutional:  Positive for fatigue.  Respiratory:  Positive for apnea and shortness of breath.   Psychiatric/Behavioral:  Positive for sleep disturbance.     Vitals:   11/19/23 1556  BP: 126/82  Pulse: 68  SpO2: 94%      Physical Exam Constitutional:      Appearance: She is obese.  HENT:     Head: Normocephalic.     Mouth/Throat:     Comments: Mallampati 3, crowded oropharynx Cardiovascular:     Rate and Rhythm: Normal rate  and regular rhythm.     Heart sounds: No murmur heard.    No friction rub.  Pulmonary:     Effort: No respiratory distress.     Breath sounds: No stridor. No wheezing or rhonchi.  Musculoskeletal:     Cervical back: No rigidity or tenderness.  Neurological:     Mental Status: She is alert.  Psychiatric:        Mood and Affect: Mood normal.    Data Reviewed: Previous sleep study from 2019 reviewed by myself showing she was titrated to CPAP therapy Study showed AHI of over 32, titrated to CPAP of 17  CPAP compliance reviewed today showing 92% compliance AutoSet of 4-20 Residual AHI of 1.6  CPAP compliance reviewed showing excellent compliance with CPAP at 90% Average use of 9 hours 23 minutes AutoSet 4-20 95 percentile pressure of 17.9 with a residual AHI of 2.0  Assessment:  Severe obstructive sleep apnea -Sleep apnea adequately treated with CPAP therapy - Benefiting from CPAP - Compliant with use  Some daytime sleepiness - She feels rested waking up in the morning and usually able to function well during the day  Deconditioning - Encouraged graded activities as tolerated  Class III obesity - Encourage weight loss efforts  Plan/Recommendations: Weight loss as tolerated  Continue CPAP on a nightly basis  Follow-up a year from now  Encouraged to call with significant concerns  Jennet Epley MD [facility] 11/19/2023, 3:59 PM  CC: Jackson, Kerra J, PA-C

## 2023-11-19 NOTE — Patient Instructions (Signed)
 The download from your machine shows it is working well  The CPAP is controlling almost all the sleep apnea events  Continue using your CPAP nightly Graded activities as tolerated  I will see you a year from now  Make sure to give us  a call if you have any significant concerns

## 2023-11-20 ENCOUNTER — Ambulatory Visit

## 2023-11-20 DIAGNOSIS — M6281 Muscle weakness (generalized): Secondary | ICD-10-CM

## 2023-11-20 DIAGNOSIS — R2689 Other abnormalities of gait and mobility: Secondary | ICD-10-CM

## 2023-11-20 DIAGNOSIS — M25561 Pain in right knee: Secondary | ICD-10-CM | POA: Diagnosis not present

## 2023-11-20 DIAGNOSIS — G8929 Other chronic pain: Secondary | ICD-10-CM

## 2023-11-20 NOTE — Therapy (Addendum)
 OUTPATIENT PHYSICAL THERAPY LOWER EXTREMITY EVALUATION   Patient Name: Lauren Moore MRN: [id number] DOB:1960/06/12, 63 y.o., female Today's Date: 11/20/2023  END OF SESSION:  PT End of Session - 11/20/23 1152     Visit Number 2    Number of Visits 16    Date for Recertification  01/12/24    Authorization Type [employer]    PT Start Time 1145    PT Stop Time 1230    PT Time Calculation (min) 45 min    Activity Tolerance Patient tolerated treatment well          Past Medical History:  Diagnosis Date   Arthritis    Brain injury (HCC)    Dyspnea    Hypertension    Obesity    Sleep apnea    cpap   Past Surgical History:  Procedure Laterality Date   BRAIN SURGERY     COLONOSCOPY N/A 06/26/2012   Procedure: COLONOSCOPY;  Surgeon: Norleen JAYSON Hint, MD;  Location: [facility];  Service: Endoscopy;  Laterality: N/A;   REVERSE SHOULDER ARTHROPLASTY Left 07/25/2022   Procedure: LEFT REVERSE SHOULDER ARTHROPLASTY;  Surgeon: Cristy Bonner DASEN, MD;  Location: [facility];  Service: Orthopedics;  Laterality: Left;   TYMPANOPLASTY Right 11/25/2017   Procedure: TYMPANOPLASTY;  Surgeon: Jesus Oliphant, MD;  Location: [facility];  Service: ENT;  Laterality: Right;   Patient Active Problem List   Diagnosis Date Noted   Closed subchondral insufficiency fracture of condyle of right femur (HCC) 11/14/2023   Insufficiency fracture of tibia 11/14/2023   Body mass index 45.0-49.9, adult (HCC) 11/14/2023   Primary osteoarthritis of right knee 10/18/2023   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   OSA (obstructive sleep apnea) 01/02/2022   Need for shingles vaccine 10/30/2017   Need for Tdap vaccination 10/30/2017   Screening for colon cancer 10/30/2017   Screening for human immunodeficiency virus 10/30/2017   Need for hepatitis C screening test 10/30/2017   Pain of both hip joints 10/30/2017   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   Polyuria  10/30/2017   Weight gain 10/30/2017   Essential hypertension 10/30/2017   Other abnormal glucose 10/30/2017   Breast cancer screening by mammogram 10/30/2017   Conductive hearing loss of right ear 09/26/2017   Perforation of right tympanic membrane 09/26/2017   Rectal bleed 06/24/2012   Hypokalemia 06/24/2012   Thrombocytopenia 06/24/2012   Alcohol abuse 06/24/2012   Elevated liver enzymes 06/24/2012    PCP: Celestia Harder, NP PCP - General   REFERRING PROVIDER: Martha Bouchard, LAT  REFERRING DIAG: M17.11 (ICD-10-CM) - Primary osteoarthritis of right knee  IMAGING: There is stable chronic either a large osteophyte from remote injury or benign exostosis of the medial femoral condyle near the insertion of the MCL. No aggressive features or suspicious enhancement. This is unchanged compared with x-ray from 2022.   Moderate tricompartmental osteoarthrosis most notably the lateral compartment. Chondromalacia and subchondral reactive edema. Small reactive joint effusion.   Horizontal signal in the posterior horn and posterior body of the lateral meniscus which does not definitively communicate with the articular surface. This could reflect an intrasubstance tear. No displaced meniscal tear is present.  THERAPY DIAG:  Chronic pain of right knee  Balance problems  Muscle weakness (generalized)  Rationale for Evaluation and Treatment: Rehabilitation  ONSET DATE: one year ago  SUBJECTIVE:   SUBJECTIVE STATEMENT: No pain today, been consistent with HEP. Been enjoying staying active as possible. PERTINENT HISTORY: Fall risk,  can't walk with cane bc of tripping PAIN:  10/10W 8/10 B 0/10C  Are you having pain? Yes: NPRS scale: 10 Pain location: R knee Pain description: achy like a toothache Aggravating factors: walking for too long Relieving factors: topical cream  PRECAUTIONS: None  RED FLAGS: None   WEIGHT BEARING RESTRICTIONS: No  FALLS:  Has patient fallen in  last 6 months? No     PLOF: Independent with basic ADLs  PATIENT GOALS: walk better, decrease pain, improve balance    OBJECTIVE:  Note: Objective measures were completed at Evaluation unless otherwise noted.    PATIENT SURVEYS:  Modified Oswestry:  MODIFIED OSWESTRY DISABILITY SCALE  Date: 10/14 Score  Pain intensity   2. Personal care (washing, dressing, etc.)   3. Lifting   4. Walking   5. Sitting   6. Standing   7. Sleeping   8. Social Life   9. Traveling   10. Employment/ Homemaking   Total 40/50   Interpretation of scores: Score Category Description  0-20% Minimal Disability The patient can cope with most living activities. Usually no treatment is indicated apart from advice on lifting, sitting and exercise  21-40% Moderate Disability The patient experiences more pain and difficulty with sitting, lifting and standing. Travel and social life are more difficult and they may be disabled from work. Personal care, sexual activity and sleeping are not grossly affected, and the patient can usually be managed by conservative means  41-60% Severe Disability Pain remains the main problem in this group, but activities of daily living are affected. These patients require a detailed investigation  61-80% Crippled Back pain impinges on all aspects of the patient's life. Positive intervention is required  81-100% Bed-bound These patients are either bed-bound or exaggerating their symptoms  Bluford FORBES Zoe DELENA Karon DELENA, et al. Surgery versus conservative management of stable thoracolumbar fracture: the PRESTO feasibility RCT. [place] ([place]): [employer]; 2021 Nov. [facility] Technology Assessment, No. 25.62.) Appendix 3, Oswestry Disability Index category descriptors. Available from: FindJewelers.cz  Minimally Clinically Important Difference (MCID) = 12.8%  COGNITION: Overall cognitive status: Within functional limits for tasks  assessed     SENSATION: WFL   POSTURE: rounded shoulders and forward head  PALPATION: TTP R distal quad, hamstring, gastroc, anterior knee/patella  LOWER EXTREMITY ROM:  Active ROM Right eval Left eval  Hip flexion    Hip extension    Hip abduction    Hip adduction    Hip internal rotation    Hip external rotation    Knee flexion 90 wfl  Knee extension 0 wfl  Ankle dorsiflexion    Ankle plantarflexion    Ankle inversion    Ankle eversion     (Blank rows = not tested)  LOWER EXTREMITY MMT:   *Difficulty with gait (slow and antalgic) alongside min A with STS from eval room chair*   MMT Right eval Left eval  Hip flexion 3+ 3+  Hip extension    Hip abduction 4- 4-  Hip adduction 4- 4-  Hip internal rotation    Hip external rotation    Knee flexion 4- 4-  Knee extension 4- 4+  Ankle dorsiflexion 4- 4  Ankle plantarflexion 4+ 4+  Ankle inversion    Ankle eversion     (Blank rows = not tested)  TREATMENT DATE:   TREATMENT   TREATMENT 11/20/2023:  Therapeutic Exercise: Seated BL calf raise x8x3s Seated R LAQx8x3s, 2#x10x3s, 4#x2x3s, 3#x6x3s Seated R green TB PF 2x8x3s Seated R TB HS curl x8x3s  Seated red TB clamshell x8x3s  Manual therapy: STM L quad and patella   Therapeutic Activity: Reviewed HEP and reassessment     11/12/23 Therapeutic Exercise: - seated calf raise 2x8x3s -seated LAQ 2x8x3s -seated partial hamstring curl 2x8x3s Patient educated on HEP, POC, prognosis, and relevant tissues/anatomy.   Manual Therapy: - STM R distal quad, hamstring, and gastroc    PATIENT EDUCATION:  Education details: HEP Person educated: Patient Education method: Programmer, multimedia, Demonstration, Verbal cues, and Handouts Education comprehension: verbalized understanding and returned demonstration  HOME EXERCISE  PROGRAM: Access Code: [email or web address] URL: [email or web address] Date: 11/12/2023 Prepared by: Washington Scot  Exercises - BL Seated PF w green TB - 2 x daily - 5 x weekly - 2 sets - 8 reps - 3 hold - Seated Long Arc Quad  - 2 x daily - 5 x weekly - 2 sets - 8 reps - 3 hold - Seated Knee Flexion  - 2 x daily - 5 x weekly - 2 sets - 8 reps - 3 hold -seated clamshell red TB 5x/wk, 2x/day 2x8x3s  ASSESSMENT:  CLINICAL IMPRESSION:  Patient tolerated treatment with no increases in pain with progressions in BL Hip loading and LLE loading. Current deficits include: activity tolerance, functional strength, gait, and excessive pain. As a result, patient would continue to benefit from skilled PT to address said deficits via plan below.   Patient is a 63 year old female who presents with chronic R knee pain due to osteoarthritis. Patient presents with deficits in: general lower body weakness, balance, excessive pain, and activity tolerance. As a result, the patient would benefit from skilled PT to address aforementioned deficits via plan below.   OBJECTIVE IMPAIRMENTS: Abnormal gait, decreased balance, difficulty walking, decreased ROM, decreased strength, obesity, and pain.   ACTIVITY LIMITATIONS: lifting, sitting, and squatting    PERSONAL FACTORS: Age, Fitness, and 3+ comorbidities:   are also affecting patient's functional outcome.   REHAB POTENTIAL: Fair    CLINICAL DECISION MAKING: Stable/uncomplicated  EVALUATION COMPLEXITY: Moderate   GOALS: Goals reviewed with patient? No  SHORT TERM GOALS: Target date: 12/03/2023   Patient will demonstrate 75% HEP compliance to show independence with self-management of condition  Baseline: 0% Goal status: INITIAL  2.  Patient will decrease worst pain to 8/10 at most to improve ADL completion and overall QOL  Baseline: 10/10 Goal status: INITIAL      LONG TERM GOALS: Target date: 01/12/24  Patient will demonstrate a 9 point  improvement in ODI to show improvements in ADL completion and overall QOL   Baseline:  Goal status: INITIAL  2.  Patient will decrease worst pain to 6/10 at most to improve ADL completion and overall QOL  Baseline: 10/10 Goal status: INITIAL  3.  Patient will demonstrate 100% HEP compliance to show independence with self-management of condition  Baseline: 0% Goal status: INITIAL    4. Patient will be able to report balance is at least 75% capacity to demonstrate improvements in lower body ADL's and gait  Baseline: 74% Goal status: INITIAL     PLAN:  PT FREQUENCY: 1-2x/week  PT DURATION: 8 weeks  PLANNED INTERVENTIONS: 97110-Therapeutic exercises, 97530- Therapeutic activity, [id number]- Neuromuscular re-education, 97535- Self Care, [id number]- Manual therapy, [phone]- Gait training, Patient/Family education, Balance training, and Stair training  PLAN FOR NEXT SESSION: HEP assessment and progression, symptom modulation, and loading (isolated and/or functional). Manual therapy, aerobic, gait, and NME training as needed. Work on improving BL hip flexion for gait    Washington Odessia Scot  PT, DPT

## 2023-11-22 ENCOUNTER — Other Ambulatory Visit: Payer: Self-pay | Admitting: Physician Assistant

## 2023-11-22 ENCOUNTER — Ambulatory Visit

## 2023-11-22 ENCOUNTER — Ambulatory Visit (INDEPENDENT_AMBULATORY_CARE_PROVIDER_SITE_OTHER): Admitting: Podiatry

## 2023-11-22 ENCOUNTER — Ambulatory Visit (INDEPENDENT_AMBULATORY_CARE_PROVIDER_SITE_OTHER)

## 2023-11-22 ENCOUNTER — Encounter: Payer: Self-pay | Admitting: Podiatry

## 2023-11-22 DIAGNOSIS — R2689 Other abnormalities of gait and mobility: Secondary | ICD-10-CM

## 2023-11-22 DIAGNOSIS — M25561 Pain in right knee: Secondary | ICD-10-CM | POA: Diagnosis not present

## 2023-11-22 DIAGNOSIS — M6281 Muscle weakness (generalized): Secondary | ICD-10-CM

## 2023-11-22 DIAGNOSIS — M7751 Other enthesopathy of right foot: Secondary | ICD-10-CM

## 2023-11-22 DIAGNOSIS — G8929 Other chronic pain: Secondary | ICD-10-CM

## 2023-11-22 MED ORDER — OXYCODONE-ACETAMINOPHEN 5-325 MG PO TABS: 1.0000 | ORAL_TABLET | Freq: Four times a day (QID) | ORAL | 0 refills | Status: AC | PRN

## 2023-11-22 MED ORDER — DOXYCYCLINE HYCLATE 100 MG PO CAPS: 100.0000 mg | ORAL_CAPSULE | Freq: Two times a day (BID) | ORAL | 0 refills | Status: AC

## 2023-11-22 MED ORDER — ONDANSETRON HCL 4 MG PO TABS: 4.0000 mg | ORAL_TABLET | Freq: Three times a day (TID) | ORAL | 0 refills | Status: AC | PRN

## 2023-11-22 MED ORDER — METHOCARBAMOL 750 MG PO TABS: 750.0000 mg | ORAL_TABLET | Freq: Three times a day (TID) | ORAL | 2 refills | Status: AC | PRN

## 2023-11-22 MED ORDER — DOCUSATE SODIUM 100 MG PO CAPS: 100.0000 mg | ORAL_CAPSULE | Freq: Every day | ORAL | 2 refills | Status: AC | PRN

## 2023-11-22 NOTE — Therapy (Signed)
 OUTPATIENT PHYSICAL THERAPY LOWER EXTREMITY EVALUATION   Patient Name: Urvi Imes MRN: [id number] DOB:1960/05/13, 63 y.o., female Today's Date: 11/23/2023  END OF SESSION:  PT End of Session - 11/22/23 2053     Visit Number 3    Number of Visits 16    Date for Recertification  01/12/24    Authorization Type [employer]    PT Start Time 1230    PT Stop Time 1315    PT Time Calculation (min) 45 min    Activity Tolerance Patient tolerated treatment well           Past Medical History:  Diagnosis Date   Arthritis    Brain injury (HCC)    Dyspnea    Hypertension    Obesity    Sleep apnea    cpap   Past Surgical History:  Procedure Laterality Date   BRAIN SURGERY     COLONOSCOPY N/A 06/26/2012   Procedure: COLONOSCOPY;  Surgeon: Norleen JAYSON Hint, MD;  Location: [facility];  Service: Endoscopy;  Laterality: N/A;   REVERSE SHOULDER ARTHROPLASTY Left 07/25/2022   Procedure: LEFT REVERSE SHOULDER ARTHROPLASTY;  Surgeon: Cristy Bonner DASEN, MD;  Location: [facility];  Service: Orthopedics;  Laterality: Left;   TYMPANOPLASTY Right 11/25/2017   Procedure: TYMPANOPLASTY;  Surgeon: Jesus Oliphant, MD;  Location: [facility];  Service: ENT;  Laterality: Right;   Patient Active Problem List   Diagnosis Date Noted   Closed subchondral insufficiency fracture of condyle of right femur (HCC) 11/14/2023   Insufficiency fracture of tibia 11/14/2023   Body mass index 45.0-49.9, adult (HCC) 11/14/2023   Primary osteoarthritis of right knee 10/18/2023   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   OSA (obstructive sleep apnea) 01/02/2022   Need for shingles vaccine 10/30/2017   Need for Tdap vaccination 10/30/2017   Screening for colon cancer 10/30/2017   Screening for human immunodeficiency virus 10/30/2017   Need for hepatitis C screening test 10/30/2017   Pain of both hip joints 10/30/2017   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    Polyuria 10/30/2017   Weight gain 10/30/2017   Essential hypertension 10/30/2017   Other abnormal glucose 10/30/2017   Breast cancer screening by mammogram 10/30/2017   Conductive hearing loss of right ear 09/26/2017   Perforation of right tympanic membrane 09/26/2017   Rectal bleed 06/24/2012   Hypokalemia 06/24/2012   Thrombocytopenia 06/24/2012   Alcohol abuse 06/24/2012   Elevated liver enzymes 06/24/2012    PCP: Celestia Harder, NP PCP - General   REFERRING PROVIDER: Martha Bouchard, LAT  REFERRING DIAG: M17.11 (ICD-10-CM) - Primary osteoarthritis of right knee  IMAGING: There is stable chronic either a large osteophyte from remote injury or benign exostosis of the medial femoral condyle near the insertion of the MCL. No aggressive features or suspicious enhancement. This is unchanged compared with x-ray from 2022.   Moderate tricompartmental osteoarthrosis most notably the lateral compartment. Chondromalacia and subchondral reactive edema. Small reactive joint effusion.   Horizontal signal in the posterior horn and posterior body of the lateral meniscus which does not definitively communicate with the articular surface. This could reflect an intrasubstance tear. No displaced meniscal tear is present.  THERAPY DIAG:  Chronic pain of right knee  Balance problems  Muscle weakness (generalized)  Rationale for Evaluation and Treatment: Rehabilitation  ONSET DATE: one year ago  SUBJECTIVE:   SUBJECTIVE STATEMENT: No pain today, still consistent with HEP. Able to take shower without knee pain. R toe  hurting when walking from minor surgery.   PERTINENT HISTORY: Fall risk, can't walk with cane bc of tripping PAIN:  10/10W 8/10 B 0/10C  Are you having pain? Yes: NPRS scale: 10 Pain location: R knee Pain description: achy like a toothache Aggravating factors: walking for too long Relieving factors: topical cream  PRECAUTIONS: None  RED FLAGS: None   WEIGHT  BEARING RESTRICTIONS: No  FALLS:  Has patient fallen in last 6 months? No     PLOF: Independent with basic ADLs  PATIENT GOALS: walk better, decrease pain, improve balance    OBJECTIVE:  Note: Objective measures were completed at Evaluation unless otherwise noted.    PATIENT SURVEYS:  Modified Oswestry:  MODIFIED OSWESTRY DISABILITY SCALE  Date: 10/14 Score  Pain intensity   2. Personal care (washing, dressing, etc.)   3. Lifting   4. Walking   5. Sitting   6. Standing   7. Sleeping   8. Social Life   9. Traveling   10. Employment/ Homemaking   Total 40/50   Interpretation of scores: Score Category Description  0-20% Minimal Disability The patient can cope with most living activities. Usually no treatment is indicated apart from advice on lifting, sitting and exercise  21-40% Moderate Disability The patient experiences more pain and difficulty with sitting, lifting and standing. Travel and social life are more difficult and they may be disabled from work. Personal care, sexual activity and sleeping are not grossly affected, and the patient can usually be managed by conservative means  41-60% Severe Disability Pain remains the main problem in this group, but activities of daily living are affected. These patients require a detailed investigation  61-80% Crippled Back pain impinges on all aspects of the patient's life. Positive intervention is required  81-100% Bed-bound These patients are either bed-bound or exaggerating their symptoms  Bluford FORBES Zoe DELENA Karon DELENA, et al. Surgery versus conservative management of stable thoracolumbar fracture: the PRESTO feasibility RCT. [place] ([place]): [employer]; 2021 Nov. [facility] Technology Assessment, No. 25.62.) Appendix 3, Oswestry Disability Index category descriptors. Available from: Findjewelers.cz  Minimally Clinically Important Difference (MCID) = 12.8%  COGNITION: Overall  cognitive status: Within functional limits for tasks assessed     SENSATION: WFL   POSTURE: rounded shoulders and forward head  PALPATION: TTP R distal quad, hamstring, gastroc, anterior knee/patella  LOWER EXTREMITY ROM:  Active ROM Right eval Left eval  Hip flexion    Hip extension    Hip abduction    Hip adduction    Hip internal rotation    Hip external rotation    Knee flexion 90 wfl  Knee extension 0 wfl  Ankle dorsiflexion    Ankle plantarflexion    Ankle inversion    Ankle eversion     (Blank rows = not tested)  LOWER EXTREMITY MMT:   *Difficulty with gait (slow and antalgic) alongside min A with STS from eval room chair*   MMT Right eval Left eval  Hip flexion 3+ 3+  Hip extension    Hip abduction 4- 4-  Hip adduction 4- 4-  Hip internal rotation    Hip external rotation    Knee flexion 4- 4-  Knee extension 4- 4+  Ankle dorsiflexion 4- 4  Ankle plantarflexion 4+ 4+  Ankle inversion    Ankle eversion     (Blank rows = not tested)  TREATMENT DATE:   TREATMENT    TREATMENT 11/22/2023:  Therapeutic Exercise: *Pt required extra recuing on exercises, extra time during and between sets due to decreased activity tolerance* Green TB UL PF (BL) x10x3s, x8x3s Blue TB Partial Seated marches 2x6x3s 3# LAQ UL (BL) 2x6x3s Seated clamshell red TB 2x8x3s  Therapeutic Activity: HEP reassessment and update STS x5 (appx 120 degrees)    TREATMENT 11/20/2023:  Therapeutic Exercise: Seated BL calf raise x8x3s Seated R LAQx8x3s, 2#x10x3s, 4#x2x3s, 3#x6x3s Seated R green TB PF 2x8x3s Seated R TB HS curl x8x3s  Seated red TB clamshell x8x3s  Manual therapy: STM L quad and patella   Therapeutic Activity: Reviewed HEP and reassessment     11/12/23 Therapeutic Exercise: - seated calf raise 2x8x3s -seated  LAQ 2x8x3s -seated partial hamstring curl 2x8x3s Patient educated on HEP, POC, prognosis, and relevant tissues/anatomy.   Manual Therapy: - STM R distal quad, hamstring, and gastroc    PATIENT EDUCATION:  Education details: HEP Person educated: Patient Education method: Programmer, Multimedia, Demonstration, Verbal cues, and Handouts Education comprehension: verbalized understanding and returned demonstration  HOME EXERCISE PROGRAM: Access Code: [email or web address] URL: [email or web address] Date: 11/12/2023 Prepared by: Washington Scot  Exercises - BL Seated PF w green TB - 2 x daily - 5 x weekly - 2 sets - 8 reps - 3 hold - Seated Long Arc Quad  - 2 x daily - 5 x weekly - 2 sets - 8 reps - 3 hold - Seated Knee Flexion  - 2 x daily - 5 x weekly - 2 sets - 8 reps - 3 hold -seated clamshell red TB 5x/wk, 2x/day 2x8x3s -seated hip march  ASSESSMENT:  CLINICAL IMPRESSION:  Patient tolerated treatment with no increases in pain with progressions in BL Hip loading and LLE loading. Current deficits include: activity tolerance, functional strength, gait, and excessive pain. As a result, patient would continue to benefit from skilled PT to address said deficits via plan below.   Patient is a 63 year old female who presents with chronic R knee pain due to osteoarthritis. Patient presents with deficits in: general lower body weakness, balance, excessive pain, and activity tolerance. As a result, the patient would benefit from skilled PT to address aforementioned deficits via plan below.   OBJECTIVE IMPAIRMENTS: Abnormal gait, decreased balance, difficulty walking, decreased ROM, decreased strength, obesity, and pain.   ACTIVITY LIMITATIONS: lifting, sitting, and squatting    PERSONAL FACTORS: Age, Fitness, and 3+ comorbidities:   are also affecting patient's functional outcome.   REHAB POTENTIAL: Fair    CLINICAL DECISION MAKING: Stable/uncomplicated  EVALUATION COMPLEXITY:  Moderate   GOALS: Goals reviewed with patient? No  SHORT TERM GOALS: Target date: 12/03/2023   Patient will demonstrate 75% HEP compliance to show independence with self-management of condition  Baseline: 0% Goal status: INITIAL  2.  Patient will decrease worst pain to 8/10 at most to improve ADL completion and overall QOL  Baseline: 10/10 Goal status: INITIAL      LONG TERM GOALS: Target date: 01/12/24  Patient will demonstrate a 9 point improvement in ODI to show improvements in ADL completion and overall QOL   Baseline:  Goal status: INITIAL  2.  Patient will decrease worst pain to 6/10 at most to improve ADL completion and overall QOL  Baseline: 10/10 Goal status: INITIAL  3.  Patient will demonstrate 100% HEP compliance to show independence with self-management of condition  Baseline: 0% Goal status: INITIAL    4. Patient will be able to  report balance is at least 75% capacity to demonstrate improvements in lower body ADL's and gait  Baseline: 74% Goal status: INITIAL     PLAN:  PT FREQUENCY: 1-2x/week  PT DURATION: 8 weeks  PLANNED INTERVENTIONS: 97110-Therapeutic exercises, 97530- Therapeutic activity, 97112- Neuromuscular re-education, 97535- Self Care, [id number]- Manual therapy, [phone]- Gait training, Patient/Family education, Balance training, and Stair training  PLAN FOR NEXT SESSION: HEP assessment and progression, symptom modulation, and loading (isolated and/or functional). Manual therapy, aerobic, gait, and NME training as needed. Work on improving BL hip flexion for gait    Washington Odessia Scot  PT, DPT

## 2023-11-22 NOTE — Patient Instructions (Signed)
 More silicone pads can be purchased from:  [email or web address]  Look for gel toe caps.  You can also buy these on [employer].

## 2023-11-22 NOTE — Progress Notes (Signed)
 Chief Complaint  Patient presents with   Nail Problem    R great toe nail very sore x 1 week hit it on door. Red and swollen. Pt not diabetic. No anti coag    HPI: 63 y.o. female presents today following up with right first toe pain.  Last visit was concerned about potential ingrowing of the nail with some nail plate separation.  Debridement of the nail did seem to alleviate some of her symptoms and she is denying any pain associate with the toenail at this point.  She does complain of pain and inflammation about the distal right first toe, denies any specific trauma.  Past Medical History:  Diagnosis Date   Arthritis    Brain injury (HCC)    Dyspnea    Hypertension    Obesity    Sleep apnea    cpap    Past Surgical History:  Procedure Laterality Date   BRAIN SURGERY     COLONOSCOPY N/A 06/26/2012   Procedure: COLONOSCOPY;  Surgeon: Norleen JAYSON Hint, MD;  Location: [facility];  Service: Endoscopy;  Laterality: N/A;   REVERSE SHOULDER ARTHROPLASTY Left 07/25/2022   Procedure: LEFT REVERSE SHOULDER ARTHROPLASTY;  Surgeon: Cristy Bonner DASEN, MD;  Location: [facility];  Service: Orthopedics;  Laterality: Left;   TYMPANOPLASTY Right 11/25/2017   Procedure: TYMPANOPLASTY;  Surgeon: Jesus Oliphant, MD;  Location: [facility];  Service: ENT;  Laterality: Right;    No Known Allergies  ROS    Physical Exam: There were no vitals filed for this visit.  General: The patient is alert and oriented x3 in no acute distress.  Dermatology: Mycotic appearing toenails x 10.  Interspaces are clear of maceration and debris.  Distal right first toe localized inflammation present with some very mild erythema.  Vascular: Palpable pedal pulses bilaterally. Capillary refill within normal limits.  Generalized edema present bilaterally.  Right first toe distal aspect mild erythema and edema present.  Neurological: Light touch sensation grossly intact bilateral feet.   Musculoskeletal Exam:  Muscle strength 5/5 in dorsiflexion, plantarflexion, inversion, eversion.  Right first toe slight bow strung EHL present with toe resting in slight dorsiflexion.  Distal tuft of the toe is prominent.  Radiographic Exam: Right foot 3 views 11/22/2023 Normal osseous mineralization. Joint spaces preserved.  No acute fractures noted.  Mild to moderate bunion deformity present.  There is some spurring present of the first toe distal phalanx distal aspect that does appear prominent towards the adjacent skin envelope on lateral and oblique view.  Bowstringing appearance of the right hallux can be appreciated on lateral view.  Assessment/Plan of Care: 1. Bone spur of toe of right foot      No orders of the defined types were placed in this encounter.  None  Discussed clinical findings with patient today.  Believe that most of her symptoms are coming from rubbing of the toe distally in shoe gear.  Dispensed gel toe cap to keep area padded.  Discussed supportive measures such as Voltaren gel, icing the area.  Advised shoe modification as well, use of shoes of appropriate size, toebox height and width and with soft mesh upper material.  Did briefly discuss surgical intervention at this is unmanageable namely shaving down the bone spur of the distal right first toe.  Would be hesitant to treat the most from EHL with surgery given the patient's decreased mobility, increased body habitus.  Follow-up as needed for this.   Lynnel Zanetti L.  Lamount MAUL, AACFAS [facility]     [address] [place], [place] [address]                Office [phone]  Fax [phone]

## 2023-11-25 ENCOUNTER — Ambulatory Visit: Admitting: Family Medicine

## 2023-11-25 ENCOUNTER — Encounter: Payer: Self-pay | Admitting: Family Medicine

## 2023-11-25 ENCOUNTER — Other Ambulatory Visit (HOSPITAL_COMMUNITY)
Admission: RE | Admit: 2023-11-25 | Discharge: 2023-11-25 | Disposition: A | Discharge status: Home / Self Care | Source: Ambulatory Visit | Attending: Family Medicine | Admitting: Family Medicine

## 2023-11-25 VITALS — BP 126/72 | HR 69 | Temp 97.6°F | Ht 64.0 in | Wt 278.6 lb

## 2023-11-25 DIAGNOSIS — Z7689 Persons encountering health services in other specified circumstances: Secondary | ICD-10-CM

## 2023-11-25 DIAGNOSIS — Z1151 Encounter for screening for human papillomavirus (HPV): Secondary | ICD-10-CM | POA: Diagnosis not present

## 2023-11-25 DIAGNOSIS — Z780 Asymptomatic menopausal state: Secondary | ICD-10-CM | POA: Insufficient documentation

## 2023-11-25 DIAGNOSIS — Z124 Encounter for screening for malignant neoplasm of cervix: Secondary | ICD-10-CM

## 2023-11-25 DIAGNOSIS — Z01419 Encounter for gynecological examination (general) (routine) without abnormal findings: Secondary | ICD-10-CM | POA: Diagnosis not present

## 2023-11-25 NOTE — Addendum Note (Signed)
 Addended by: Raul Torrance on: 11/25/2023 08:58 PM   Modules accepted: Level of Service

## 2023-11-25 NOTE — Patient Instructions (Addendum)
 It was great to see you today! Thank you for choosing [facility] for your primary care. Lauren Moore was seen for new patient visit.  Today we addressed: It was great meeting you! Please bring in your medications to every visit.  Pap smear done today. Will follow up results on MyChart.  You should return to our clinic Return if symptoms worsen or fail to improve. Please arrive 15 minutes before your appointment to ensure smooth check in process.  We appreciate your efforts in making this happen.  Thank you for allowing me to participate in your care, Kathrine Melena, DO 11/25/2023, 2:28 PM PGY-2, [facility]

## 2023-11-25 NOTE — Progress Notes (Signed)
 Subjective:  Patient ID: Lauren Moore, female    DOB: November 26, 1960, 63 y.o.   MRN: [id number]  CC: New Patient  HPI:  Lauren Moore is a very pleasant 63 y.o. female who presents today to establish care.  Difficult to attain full history with patient's knowledge of her own medical conditions and medications.   No issues to discuss today, is having R knee arthroscopy 11/7.  PMHx: Past Medical History:  Diagnosis Date   Arthritis    Asthma    Brain injury (HCC)    Dyspnea    Hypertension    Obesity    Sleep apnea    cpap    Surgical Hx: Past Surgical History:  Procedure Laterality Date   BRAIN SURGERY     COLONOSCOPY N/A 06/26/2012   Procedure: COLONOSCOPY;  Surgeon: Norleen JAYSON Hint, MD;  Location: [facility];  Service: Endoscopy;  Laterality: N/A;   REVERSE SHOULDER ARTHROPLASTY Left 07/25/2022   Procedure: LEFT REVERSE SHOULDER ARTHROPLASTY;  Surgeon: Cristy Bonner DASEN, MD;  Location: [facility];  Service: Orthopedics;  Laterality: Left;   TYMPANOPLASTY Right 11/25/2017   Procedure: TYMPANOPLASTY;  Surgeon: Jesus Oliphant, MD;  Location: [facility];  Service: ENT;  Laterality: Right;    Family Hx: Family History  Problem Relation Age of Onset   Cancer Sister        Lung   Hypertension Sister    Diabetes Sister    Cancer Brother        Lung   Stroke Brother    Hypertension Brother    Colon cancer Neg Hx    Colon polyps Neg Hx    Rectal cancer Neg Hx    Stomach cancer Neg Hx     Social Hx: Current Social History   Who lives at home: lives with her sister, her sister's husband, and friend Who would speak for you about health care matters: Lauren Moore  Transportation: self Important Relationships & Pets: 2 dogs and sister Marital status: single Occupation: Retired, on disability Children: 1, Lauren Moore Grandchildren: 3  Current Stressors: none 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  Denies smoking history.  ROS: pertinent noted in the HPI    Objective:  BP 126/72   Pulse 69   Temp 97.6 F (36.4 C)   Ht 5' 4 (1.626 m)   Wt 278 lb 9.6 oz (126.4 kg)   SpO2 90%   BMI 47.82 kg/m  Vitals and nursing note reviewed  General: Awake in NAD, pleasant obese woman  HEENT: NCAT. Sclera anicteric. No rhinorrhea. Cardiovascular: RRR. No M/R/G Respiratory: CTAB, normal WOB on RA. No wheezing, crackles, rhonchi, or diminished breath sounds. Abdomen: Soft, non-tender, non-distended. Bowel sounds normoactive Extremities: Able to move all extremities. No BLE edema, no deformities or significant joint findings. Skin: Warm and dry. No abrasions or rashes noted. F GU: Normally developed genitalia with no external lesions or eruptions. Vagina and cervix show no lesions, inflammation, discharge or tenderness.  Chaperoned by CMA Rosaline Pesa.   Assessment & Plan:   Assessment & Plan Encounter to establish care Patient is a poor historian and is unable to relay all her medical history and medications at this time.  Advised patient to always bring her medications to her visits.  Overall seems to be doing well and has no major concerns today to discuss.  Scheduled for right knee arthroscopy on 11/7. Screening for cervical cancer - Pap smear performed, will follow results - STD screening performed, will follow results  Kathrine Melena, DO 11/25/2023, 2:20 PM

## 2023-11-26 ENCOUNTER — Ambulatory Visit: Payer: Self-pay | Admitting: Family Medicine

## 2023-11-26 LAB — CYTOLOGY - PAP
Adequacy: ABSENT
Chlamydia: NEGATIVE
Comment: NEGATIVE
Comment: NEGATIVE
Comment: NEGATIVE
Comment: NORMAL
Diagnosis: NEGATIVE
High risk HPV: NEGATIVE
Neisseria Gonorrhea: NEGATIVE
Trichomonas: NEGATIVE

## 2023-11-27 ENCOUNTER — Ambulatory Visit

## 2023-11-27 ENCOUNTER — Other Ambulatory Visit: Payer: Self-pay

## 2023-11-27 DIAGNOSIS — G8929 Other chronic pain: Secondary | ICD-10-CM

## 2023-11-27 DIAGNOSIS — R2689 Other abnormalities of gait and mobility: Secondary | ICD-10-CM

## 2023-11-27 DIAGNOSIS — M6281 Muscle weakness (generalized): Secondary | ICD-10-CM

## 2023-11-27 DIAGNOSIS — M25561 Pain in right knee: Secondary | ICD-10-CM | POA: Diagnosis not present

## 2023-11-27 NOTE — Therapy (Signed)
 OUTPATIENT PHYSICAL THERAPY LOWER EXTREMITY EVALUATION   Patient Name: Lauren Moore MRN: [id number] DOB:Apr 06, 1960, 63 y.o., female Today's Date: 11/27/2023  END OF SESSION:  PT End of Session - 11/27/23 1021     Visit Number 4    Number of Visits 16    Date for Recertification  01/12/24    Authorization Type [employer] Medicare    PT Start Time [phone]    PT Stop Time 1040    PT Time Calculation (min) 45 min    Activity Tolerance Patient tolerated treatment well            Past Medical History:  Diagnosis Date   Arthritis    Asthma    Brain injury (HCC)    Dyspnea    Obesity    Sleep apnea    cpap   Past Surgical History:  Procedure Laterality Date   BRAIN SURGERY     COLONOSCOPY N/A 06/26/2012   Procedure: COLONOSCOPY;  Surgeon: Norleen JAYSON Hint, MD;  Location: [facility];  Service: Endoscopy;  Laterality: N/A;   REVERSE SHOULDER ARTHROPLASTY Left 07/25/2022   Procedure: LEFT REVERSE SHOULDER ARTHROPLASTY;  Surgeon: Cristy Bonner DASEN, MD;  Location: [facility];  Service: Orthopedics;  Laterality: Left;   TYMPANOPLASTY Right 11/25/2017   Procedure: TYMPANOPLASTY;  Surgeon: Jesus Oliphant, MD;  Location: [facility];  Service: ENT;  Laterality: Right;   Patient Active Problem List   Diagnosis Date Noted   Closed subchondral insufficiency fracture of condyle of right femur (HCC) 11/14/2023   Insufficiency fracture of tibia 11/14/2023   Body mass index 45.0-49.9, adult (HCC) 11/14/2023   Primary osteoarthritis of right knee 10/18/2023   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   OSA (obstructive sleep apnea) 01/02/2022   Screening for human immunodeficiency virus 10/30/2017   Pain of both hip joints 10/30/2017   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   Polyuria 10/30/2017   Weight gain 10/30/2017   Essential hypertension 10/30/2017   Other abnormal glucose 10/30/2017   Breast cancer screening by mammogram 10/30/2017    Conductive hearing loss of right ear 09/26/2017   Perforation of right tympanic membrane 09/26/2017   Rectal bleed 06/24/2012   Hypokalemia 06/24/2012   Thrombocytopenia 06/24/2012   Alcohol abuse 06/24/2012   Elevated liver enzymes 06/24/2012    PCP: Celestia Harder, NP PCP - General   REFERRING PROVIDER: Martha Bouchard, LAT  REFERRING DIAG: M17.11 (ICD-10-CM) - Primary osteoarthritis of right knee  IMAGING: There is stable chronic either a large osteophyte from remote injury or benign exostosis of the medial femoral condyle near the insertion of the MCL. No aggressive features or suspicious enhancement. This is unchanged compared with x-ray from 2022.   Moderate tricompartmental osteoarthrosis most notably the lateral compartment. Chondromalacia and subchondral reactive edema. Small reactive joint effusion.   Horizontal signal in the posterior horn and posterior body of the lateral meniscus which does not definitively communicate with the articular surface. This could reflect an intrasubstance tear. No displaced meniscal tear is present.  THERAPY DIAG:  Chronic pain of right knee  Balance problems  Muscle weakness (generalized)  Rationale for Evaluation and Treatment: Rehabilitation  ONSET DATE: one year ago  SUBJECTIVE:   SUBJECTIVE STATEMENT:   Pain today with weather changes, been doing HEP consistently and even got her sister to exercise with her.   PERTINENT HISTORY: Fall risk, can't walk with cane bc of tripping PAIN:  10/10W 8/10 B 0/10C  Are you having pain? Yes:  NPRS scale: 10 Pain location: R knee Pain description: achy like a toothache Aggravating factors: walking for too long Relieving factors: topical cream  PRECAUTIONS: None  RED FLAGS: None   WEIGHT BEARING RESTRICTIONS: No  FALLS:  Has patient fallen in last 6 months? No     PLOF: Independent with basic ADLs  PATIENT GOALS: walk better, decrease pain, improve  balance    OBJECTIVE:  Note: Objective measures were completed at Evaluation unless otherwise noted.    PATIENT SURVEYS:  Modified Oswestry:  MODIFIED OSWESTRY DISABILITY SCALE  Date: 10/14 Score  Pain intensity   2. Personal care (washing, dressing, etc.)   3. Lifting   4. Walking   5. Sitting   6. Standing   7. Sleeping   8. Social Life   9. Traveling   10. Employment/ Homemaking   Total 40/50   Interpretation of scores: Score Category Description  0-20% Minimal Disability The patient can cope with most living activities. Usually no treatment is indicated apart from advice on lifting, sitting and exercise  21-40% Moderate Disability The patient experiences more pain and difficulty with sitting, lifting and standing. Travel and social life are more difficult and they may be disabled from work. Personal care, sexual activity and sleeping are not grossly affected, and the patient can usually be managed by conservative means  41-60% Severe Disability Pain remains the main problem in this group, but activities of daily living are affected. These patients require a detailed investigation  61-80% Crippled Back pain impinges on all aspects of the patient's life. Positive intervention is required  81-100% Bed-bound These patients are either bed-bound or exaggerating their symptoms  Bluford FORBES Zoe DELENA Karon DELENA, et al. Surgery versus conservative management of stable thoracolumbar fracture: the PRESTO feasibility RCT. [place] ([place]): [employer]; 2021 Nov. [facility] Technology Assessment, No. 25.62.) Appendix 3, Oswestry Disability Index category descriptors. Available from: Findjewelers.cz  Minimally Clinically Important Difference (MCID) = 12.8%  COGNITION: Overall cognitive status: Within functional limits for tasks assessed     SENSATION: WFL   POSTURE: rounded shoulders and forward head  PALPATION: TTP R distal quad, hamstring,  gastroc, anterior knee/patella  LOWER EXTREMITY ROM:  Active ROM Right eval Left eval  Hip flexion    Hip extension    Hip abduction    Hip adduction    Hip internal rotation    Hip external rotation    Knee flexion 90 wfl  Knee extension 0 wfl  Ankle dorsiflexion    Ankle plantarflexion    Ankle inversion    Ankle eversion     (Blank rows = not tested)  LOWER EXTREMITY MMT:   *Difficulty with gait (slow and antalgic) alongside min A with STS from eval room chair*   MMT Right eval Left eval  Hip flexion 3+ 3+  Hip extension    Hip abduction 4- 4-  Hip adduction 4- 4-  Hip internal rotation    Hip external rotation    Knee flexion 4- 4-  Knee extension 4- 4+  Ankle dorsiflexion 4- 4  Ankle plantarflexion 4+ 4+  Ankle inversion    Ankle eversion     (Blank rows = not tested)  TREATMENT DATE:   TREATMENT  Therapeutic Exercise 11/27/23: *Pt required extra recuing on exercises, extra time during and between sets due to decreased activity tolerance* Seated BL PF 2x8x3s Blue TB UL PF (BL) 2x8x3s Blue TB Partial Seated marches 2x8x3s 3# LAQ UL (BL) 2x6x3s Seated clamshell red TB 2x10x3s Seated PPT x8x3s  Therapeutic Activity: HEP reassessment and update STS x6, x4 (appx 120 degrees)    TREATMENT 11/22/2023: Therapeutic Exercise: *Pt required extra recuing on exercises, extra time during and between sets due to decreased activity tolerance* Green TB UL PF (BL) x10x3s, x8x3s Blue TB Partial Seated marches 2x6x3s 3# LAQ UL (BL) 2x6x3s Seated clamshell red TB 2x8x3s  Therapeutic Activity: HEP reassessment and update STS x5 (appx 120 degrees)    TREATMENT 11/20/2023:  Therapeutic Exercise: Seated BL calf raise x8x3s Seated R LAQx8x3s, 2#x10x3s, 4#x2x3s, 3#x6x3s Seated R green TB PF 2x8x3s Seated R TB HS curl x8x3s   Seated red TB clamshell x8x3s  Manual therapy: STM L quad and patella   Therapeutic Activity: Reviewed HEP and reassessment     11/12/23 Therapeutic Exercise: - seated calf raise 2x8x3s -seated LAQ 2x8x3s -seated partial hamstring curl 2x8x3s Patient educated on HEP, POC, prognosis, and relevant tissues/anatomy.   Manual Therapy: - STM R distal quad, hamstring, and gastroc    PATIENT EDUCATION:  Education details: HEP Person educated: Patient Education method: Programmer, Multimedia, Demonstration, Verbal cues, and Handouts Education comprehension: verbalized understanding and returned demonstration  HOME EXERCISE PROGRAM: Access Code: [email or web address] URL: [email or web address] Date: 11/12/2023 Prepared by: Washington Scot  Exercises - BL Seated PF w green TB - 2 x daily - 5 x weekly - 2 sets - 8 reps - 3 hold - Seated Long Arc Quad  - 2 x daily - 5 x weekly - 2 sets - 8 reps - 3 hold - Seated Knee Flexion  - 2 x daily - 5 x weekly - 2 sets - 8 reps - 3 hold -seated clamshell red TB 5x/wk, 2x/day 2x8x3s -seated hip march Seated PPT  ASSESSMENT:  CLINICAL IMPRESSION:  Patient tolerated treatment with no increases in pain with progressions in BL Hip loading, BLLE loading (isolated and functional). Current deficits include: activity tolerance, functional lower body strength, gait, balance, and excessive pain. As a result, patient would continue to benefit from skilled PT to address said deficits via plan below.   Patient is a 63 year old female who presents with chronic R knee pain due to osteoarthritis. Patient presents with deficits in: general lower body weakness, balance, excessive pain, and activity tolerance. As a result, the patient would benefit from skilled PT to address aforementioned deficits via plan below.   OBJECTIVE IMPAIRMENTS: Abnormal gait, decreased balance, difficulty walking, decreased ROM, decreased strength, obesity, and pain.   ACTIVITY  LIMITATIONS: lifting, sitting, and squatting    PERSONAL FACTORS: Age, Fitness, and 3+ comorbidities:   are also affecting patient's functional outcome.   REHAB POTENTIAL: Fair    CLINICAL DECISION MAKING: Stable/uncomplicated  EVALUATION COMPLEXITY: Moderate   GOALS: Goals reviewed with patient? No  SHORT TERM GOALS: Target date: 12/03/2023   Patient will demonstrate 75% HEP compliance to show independence with self-management of condition  Baseline: 0% Goal status: INITIAL  2.  Patient will decrease worst pain to 8/10 at most to improve ADL completion and overall QOL  Baseline: 10/10 Goal status: INITIAL      LONG TERM GOALS: Target date: 01/12/24  Patient will demonstrate a 9 point improvement in ODI to show improvements  in ADL completion and overall QOL   Baseline:  Goal status: INITIAL  2.  Patient will decrease worst pain to 6/10 at most to improve ADL completion and overall QOL  Baseline: 10/10 Goal status: INITIAL  3.  Patient will demonstrate 100% HEP compliance to show independence with self-management of condition  Baseline: 0% Goal status: INITIAL    4. Patient will be able to report balance is at least 75% capacity to demonstrate improvements in lower body ADL's and gait  Baseline: 74% Goal status: INITIAL     PLAN:  PT FREQUENCY: 1-2x/week  PT DURATION: 8 weeks  PLANNED INTERVENTIONS: 97110-Therapeutic exercises, 97530- Therapeutic activity, 97112- Neuromuscular re-education, 97535- Self Care, [id number]- Manual therapy, [phone]- Gait training, Patient/Family education, Balance training, and Stair training  PLAN FOR NEXT SESSION: HEP assessment and progression, symptom modulation, and loading (isolated and/or functional). Manual therapy, aerobic, gait, and NME training as needed. Improving STS, gait, and balance.    Washington Odessia Scot  PT, DPT

## 2023-11-29 ENCOUNTER — Ambulatory Visit

## 2023-12-02 ENCOUNTER — Encounter: Payer: Self-pay | Admitting: Radiology

## 2023-12-02 NOTE — Progress Notes (Signed)
 Anesthesia Review:  PCP: Mace Melena LOV 11/25/23  Cardiologist :  PPM/ ICD: Device Orders: Rep Notified:  Chest x-ray : EKG : Echo : Stress test: Cardiac Cath :   Activity level:  Sleep Study/ CPAP : Fasting Blood Sugar :      / Checks Blood Sugar -- times a day:    Blood Thinner/ Instructions /Last Dose: ASA / Instructions/ Last Dose :

## 2023-12-02 NOTE — Patient Instructions (Signed)
 SURGICAL WAITING ROOM VISITATION  Patients having surgery or a procedure may have no more than 2 support people in the waiting area - these visitors may rotate.    Children under the age of 4 must have an adult with them who is not the patient.  Visitors with respiratory illnesses are discouraged from visiting and should remain at home.  If the patient needs to stay at the hospital during part of their recovery, the visitor guidelines for inpatient rooms apply. Pre-op nurse will coordinate an appropriate time for 1 support person to accompany patient in pre-op.  This support person may not rotate.    Please refer to the [facility] website for the visitor guidelines for Inpatients (after your surgery is over and you are in a regular room).       Your procedure is scheduled on:  12/06/2023    Report to [facility] Main Entrance    Report to admitting at   [phone]   Call this number if you have problems the morning of surgery [phone]   Do not eat food :After Midnight.   After Midnight you may have the following liquids until ___ 0430___ AM/ PM DAY OF SURGERY  Water  Non-Citrus Juices (without pulp, NO RED-Apple, White grape, White cranberry) Black Coffee (NO MILK/CREAM OR CREAMERS, sugar ok)  Clear Tea (NO MILK/CREAM OR CREAMERS, sugar ok) regular and decaf                             Plain Jell-O (NO RED)                                           Fruit ices (not with fruit pulp, NO RED)                                     Popsicles (NO RED)                                                               Sports drinks like Gatorade (NO RED)                            If you have questions, please contact your surgeon's office.      Oral Hygiene is also important to reduce your risk of infection.                                    Remember - BRUSH YOUR TEETH THE MORNING OF SURGERY WITH YOUR REGULAR TOOTHPASTE  DENTURES WILL BE REMOVED PRIOR TO SURGERY PLEASE DO  NOT APPLY Poly grip OR ADHESIVES!!!   Do NOT smoke after Midnight   Stop all vitamins and herbal supplements 7 days before surgery.   Take these medicines the morning of surgery with A SIP OF WATER :  inhalers as usual and bring   DO NOT TAKE ANY ORAL DIABETIC MEDICATIONS DAY OF YOUR SURGERY  Bring CPAP  mask and tubing day of surgery.                              You may not have any metal on your body including hair pins, jewelry, and body piercing             Do not wear make-up, lotions, powders, perfumes/cologne, or deodorant  Do not wear nail polish including gel and S&S, artificial/acrylic nails, or any other type of covering on natural nails including finger and toenails. If you have artificial nails, gel coating, etc. that needs to be removed by a nail salon please have this removed prior to surgery or surgery may need to be canceled/ delayed if the surgeon/ anesthesia feels like they are unable to be safely monitored.   Do not shave  48 hours prior to surgery.               Men may shave face and neck.   Do not bring valuables to the hospital. [place] IS NOT             RESPONSIBLE   FOR VALUABLES.   Contacts, glasses, dentures or bridgework may not be worn into surgery.   Bring small overnight bag day of surgery.   DO NOT BRING YOUR HOME MEDICATIONS TO THE HOSPITAL. PHARMACY WILL DISPENSE MEDICATIONS LISTED ON YOUR MEDICATION LIST TO YOU DURING YOUR ADMISSION IN THE HOSPITAL!    Patients discharged on the day of surgery will not be allowed to drive home.  Someone NEEDS to stay with you for the first 24 hours after anesthesia.   Special Instructions: Bring a copy of your healthcare power of attorney and living will documents the day of surgery if you haven't scanned them before.              Please read over the following fact sheets you were given: IF YOU HAVE QUESTIONS ABOUT YOUR PRE-OP INSTRUCTIONS PLEASE CALL [phone].   If you received a COVID test during  your pre-op visit  it is requested that you wear a mask when out in public, stay away from anyone that may not be feeling well and notify your surgeon if you develop symptoms. If you test positive for Covid or have been in contact with anyone that has tested positive in the last 10 days please notify you surgeon.    [place] - Preparing for Surgery Before surgery, you can play an important role.  Because skin is not sterile, your skin needs to be as free of germs as possible.  You can reduce the number of germs on your skin by washing with CHG (chlorahexidine gluconate) soap before surgery.  CHG is an antiseptic cleaner which kills germs and bonds with the skin to continue killing germs even after washing. Please DO NOT use if you have an allergy to CHG or antibacterial soaps.  If your skin becomes reddened/irritated stop using the CHG and inform your nurse when you arrive at Short Stay. Do not shave (including legs and underarms) for at least 48 hours prior to the first CHG shower.  You may shave your face/neck.  Please follow these instructions carefully:  1.  Shower with CHG Soap the night before surgery ONLY (DO NOT USE THE SOAP THE MORNING OF SURGERY).  2.  If you choose to wash your hair, wash your hair first as usual with your normal  shampoo.  3.  After  you shampoo, rinse your hair and body thoroughly to remove the shampoo.                             4.  Use CHG as you would any other liquid soap.  You can apply chg directly to the skin and wash.  Gently with a scrungie or clean washcloth.  5.  Apply the CHG Soap to your body ONLY FROM THE NECK DOWN.   Do   not use on face/ open                           Wound or open sores. Avoid contact with eyes, ears mouth and   genitals (private parts).                       Wash face,  Genitals (private parts) with your normal soap.             6.  Wash thoroughly, paying special attention to the area where your    surgery  will be performed.  7.   Thoroughly rinse your body with warm water  from the neck down.  8.  DO NOT shower/wash with your normal soap after using and rinsing off the CHG Soap.                9.  Pat yourself dry with a clean towel.            10.  Wear clean pajamas.            11.  Place clean sheets on your bed the night of your first shower and do not  sleep with pets. Day of Surgery : Do not apply any CHG, lotions/deodorants the morning of surgery.  Please wear clean clothes to the hospital/surgery center.  FAILURE TO FOLLOW THESE INSTRUCTIONS MAY RESULT IN THE CANCELLATION OF YOUR SURGERY  PATIENT SIGNATURE_________________________________  NURSE SIGNATURE__________________________________  ________________________________________________________________________

## 2023-12-03 ENCOUNTER — Encounter (HOSPITAL_COMMUNITY)
Admission: RE | Admit: 2023-12-03 | Discharge: 2023-12-03 | Disposition: A | Discharge status: Home / Self Care | Source: Ambulatory Visit | Attending: Orthopaedic Surgery | Admitting: Orthopaedic Surgery

## 2023-12-03 ENCOUNTER — Other Ambulatory Visit: Payer: Self-pay

## 2023-12-03 ENCOUNTER — Encounter (HOSPITAL_COMMUNITY): Payer: Self-pay

## 2023-12-03 VITALS — BP 153/81 | HR 67 | Temp 98.5°F | Resp 16 | Ht 64.0 in | Wt 278.7 lb

## 2023-12-03 DIAGNOSIS — Z01818 Encounter for other preprocedural examination: Secondary | ICD-10-CM | POA: Diagnosis present

## 2023-12-03 LAB — CBC
HCT: 37.9 % (ref 36.0–46.0)
Hemoglobin: 11.8 g/dL — ABNORMAL LOW (ref 12.0–15.0)
MCH: 27.3 pg (ref 26.0–34.0)
MCHC: 31.1 g/dL (ref 30.0–36.0)
MCV: 87.7 fL (ref 80.0–100.0)
Platelets: 165 K/uL (ref 150–400)
RBC: 4.32 MIL/uL (ref 3.87–5.11)
RDW: 13.4 % (ref 11.5–15.5)
WBC: 6 K/uL (ref 4.0–10.5)
nRBC: 0 % (ref 0.0–0.2)

## 2023-12-04 ENCOUNTER — Ambulatory Visit

## 2023-12-06 ENCOUNTER — Other Ambulatory Visit: Payer: Self-pay

## 2023-12-06 ENCOUNTER — Encounter: Payer: Self-pay | Admitting: Orthopaedic Surgery

## 2023-12-06 ENCOUNTER — Encounter (HOSPITAL_COMMUNITY): Payer: Self-pay | Admitting: Orthopaedic Surgery

## 2023-12-06 ENCOUNTER — Inpatient Hospital Stay (HOSPITAL_COMMUNITY): Admitting: Anesthesiology

## 2023-12-06 ENCOUNTER — Encounter (HOSPITAL_COMMUNITY): Admission: RE | Disposition: A | Payer: Self-pay | Source: Home / Self Care | Attending: Orthopaedic Surgery

## 2023-12-06 ENCOUNTER — Inpatient Hospital Stay (HOSPITAL_COMMUNITY): Admitting: Physician Assistant

## 2023-12-06 ENCOUNTER — Ambulatory Visit (HOSPITAL_COMMUNITY)
Admission: RE | Admit: 2023-12-06 | Discharge: 2023-12-06 | Disposition: A | Discharge status: Home / Self Care | Attending: Orthopaedic Surgery | Admitting: Orthopaedic Surgery

## 2023-12-06 ENCOUNTER — Ambulatory Visit

## 2023-12-06 ENCOUNTER — Inpatient Hospital Stay (HOSPITAL_COMMUNITY)

## 2023-12-06 DIAGNOSIS — Z01818 Encounter for other preprocedural examination: Secondary | ICD-10-CM

## 2023-12-06 DIAGNOSIS — M1990 Unspecified osteoarthritis, unspecified site: Secondary | ICD-10-CM | POA: Diagnosis not present

## 2023-12-06 DIAGNOSIS — G4733 Obstructive sleep apnea (adult) (pediatric): Secondary | ICD-10-CM

## 2023-12-06 DIAGNOSIS — E66813 Obesity, class 3: Secondary | ICD-10-CM | POA: Insufficient documentation

## 2023-12-06 DIAGNOSIS — M84469A Pathological fracture, unspecified tibia and fibula, initial encounter for fracture: Secondary | ICD-10-CM | POA: Diagnosis present

## 2023-12-06 DIAGNOSIS — G4730 Sleep apnea, unspecified: Secondary | ICD-10-CM | POA: Diagnosis not present

## 2023-12-06 DIAGNOSIS — I10 Essential (primary) hypertension: Secondary | ICD-10-CM

## 2023-12-06 DIAGNOSIS — S83281A Other tear of lateral meniscus, current injury, right knee, initial encounter: Secondary | ICD-10-CM | POA: Insufficient documentation

## 2023-12-06 DIAGNOSIS — M84461A Pathological fracture, right tibia, initial encounter for fracture: Secondary | ICD-10-CM | POA: Insufficient documentation

## 2023-12-06 DIAGNOSIS — M84451A Pathological fracture, right femur, initial encounter for fracture: Secondary | ICD-10-CM | POA: Insufficient documentation

## 2023-12-06 DIAGNOSIS — M94261 Chondromalacia, right knee: Secondary | ICD-10-CM | POA: Diagnosis not present

## 2023-12-06 DIAGNOSIS — S83271A Complex tear of lateral meniscus, current injury, right knee, initial encounter: Secondary | ICD-10-CM

## 2023-12-06 DIAGNOSIS — X58XXXA Exposure to other specified factors, initial encounter: Secondary | ICD-10-CM | POA: Insufficient documentation

## 2023-12-06 DIAGNOSIS — Z6841 Body Mass Index (BMI) 40.0 and over, adult: Secondary | ICD-10-CM | POA: Insufficient documentation

## 2023-12-06 HISTORY — PX: KNEE ARTHROSCOPY WITH SUBCHONDROPLASTY: SHX6732

## 2023-12-06 SURGERY — ARTHROSCOPY, KNEE, WITH SUBCHONDROPLASTY
Anesthesia: General | Site: Knee | Laterality: Right

## 2023-12-06 MED ORDER — FENTANYL CITRATE (PF) 50 MCG/ML IJ SOSY
PREFILLED_SYRINGE | INTRAMUSCULAR | Status: AC
  Filled 2023-12-06: qty 3

## 2023-12-06 MED ORDER — ISOPROPYL ALCOHOL 70 % SOLN
Status: AC
  Filled 2023-12-06: qty 480

## 2023-12-06 MED ORDER — OXYCODONE HCL 5 MG PO TABS
ORAL_TABLET | ORAL | Status: AC
  Filled 2023-12-06: qty 1

## 2023-12-06 MED ORDER — ACETAMINOPHEN 500 MG PO TABS
1000.0000 mg | ORAL_TABLET | Freq: Once | ORAL | Status: AC
  Administered 2023-12-06: 1000 mg via ORAL
  Filled 2023-12-06: qty 2

## 2023-12-06 MED ORDER — LACTATED RINGERS IV SOLN: INTRAVENOUS | Status: DC

## 2023-12-06 MED ORDER — DEXAMETHASONE SOD PHOSPHATE PF 10 MG/ML IJ SOLN
INTRAMUSCULAR | Status: DC | PRN
  Administered 2023-12-06: 10 mg via INTRAVENOUS

## 2023-12-06 MED ORDER — HYDROCODONE-ACETAMINOPHEN 5-325 MG PO TABS: 1.0000 | ORAL_TABLET | Freq: Four times a day (QID) | ORAL | Status: DC | PRN

## 2023-12-06 MED ORDER — PROPOFOL 10 MG/ML IV BOLUS
INTRAVENOUS | Status: DC | PRN
  Administered 2023-12-06: 200 mg via INTRAVENOUS

## 2023-12-06 MED ORDER — PROPOFOL 10 MG/ML IV BOLUS
INTRAVENOUS | Status: AC
  Filled 2023-12-06: qty 20

## 2023-12-06 MED ORDER — PHENYLEPHRINE HCL-NACL 20-0.9 MG/250ML-% IV SOLN
INTRAVENOUS | Status: AC
  Filled 2023-12-06: qty 500

## 2023-12-06 MED ORDER — CHLORHEXIDINE GLUCONATE 0.12 % MT SOLN
15.0000 mL | Freq: Once | OROMUCOSAL | Status: AC
  Administered 2023-12-06: 15 mL via OROMUCOSAL

## 2023-12-06 MED ORDER — LIDOCAINE 2% (20 MG/ML) 5 ML SYRINGE
INTRAMUSCULAR | Status: DC | PRN
  Administered 2023-12-06: 60 mg via INTRAVENOUS
  Administered 2023-12-06: 100 mg via INTRAVENOUS

## 2023-12-06 MED ORDER — 0.9 % SODIUM CHLORIDE (POUR BTL) OPTIME
TOPICAL | Status: DC | PRN
  Administered 2023-12-06: 1000 mL

## 2023-12-06 MED ORDER — FENTANYL CITRATE (PF) 250 MCG/5ML IJ SOLN
INTRAMUSCULAR | Status: AC
  Filled 2023-12-06: qty 5

## 2023-12-06 MED ORDER — FENTANYL CITRATE (PF) 250 MCG/5ML IJ SOLN
INTRAMUSCULAR | Status: DC | PRN
  Administered 2023-12-06 (×2): 25 ug via INTRAVENOUS
  Administered 2023-12-06: 50 ug via INTRAVENOUS

## 2023-12-06 MED ORDER — ONDANSETRON HCL 4 MG/2ML IJ SOLN
INTRAMUSCULAR | Status: AC
  Filled 2023-12-06: qty 2

## 2023-12-06 MED ORDER — FENTANYL CITRATE (PF) 50 MCG/ML IJ SOSY
25.0000 ug | PREFILLED_SYRINGE | INTRAMUSCULAR | Status: DC | PRN
  Administered 2023-12-06 (×2): 50 ug via INTRAVENOUS

## 2023-12-06 MED ORDER — BUPIVACAINE HCL (PF) 0.25 % IJ SOLN
INTRAMUSCULAR | Status: AC
  Filled 2023-12-06: qty 30

## 2023-12-06 MED ORDER — MIDAZOLAM HCL 2 MG/2ML IJ SOLN
INTRAMUSCULAR | Status: AC
  Filled 2023-12-06: qty 2

## 2023-12-06 MED ORDER — AMISULPRIDE (ANTIEMETIC) 5 MG/2ML IV SOLN: 10.0000 mg | Freq: Once | INTRAVENOUS | Status: DC | PRN

## 2023-12-06 MED ORDER — CEFAZOLIN SODIUM-DEXTROSE 3-4 GM/150ML-% IV SOLN
3.0000 g | INTRAVENOUS | Status: AC
  Administered 2023-12-06: 3 g via INTRAVENOUS
  Filled 2023-12-06: qty 150

## 2023-12-06 MED ORDER — OXYCODONE HCL 5 MG/5ML PO SOLN: 5.0000 mg | Freq: Once | ORAL | Status: AC | PRN

## 2023-12-06 MED ORDER — LIDOCAINE HCL (PF) 2 % IJ SOLN
INTRAMUSCULAR | Status: AC
  Filled 2023-12-06: qty 5

## 2023-12-06 MED ORDER — KETOROLAC TROMETHAMINE 30 MG/ML IJ SOLN
30.0000 mg | Freq: Once | INTRAMUSCULAR | Status: AC | PRN
  Administered 2023-12-06: 30 mg via INTRAVENOUS

## 2023-12-06 MED ORDER — ONDANSETRON HCL 4 MG/2ML IJ SOLN
INTRAMUSCULAR | Status: DC | PRN
Start: 2023-12-06 — End: 2023-12-06
  Administered 2023-12-06: 4 mg via INTRAVENOUS

## 2023-12-06 MED ORDER — KETOROLAC TROMETHAMINE 30 MG/ML IJ SOLN
INTRAMUSCULAR | Status: AC
  Filled 2023-12-06: qty 1

## 2023-12-06 MED ORDER — OXYCODONE HCL 5 MG PO TABS
5.0000 mg | ORAL_TABLET | Freq: Once | ORAL | Status: AC | PRN
  Administered 2023-12-06: 5 mg via ORAL

## 2023-12-06 MED ORDER — SODIUM CHLORIDE 0.9 % IR SOLN
Status: DC | PRN
  Administered 2023-12-06 (×2): 3000 mL

## 2023-12-06 MED ORDER — ORAL CARE MOUTH RINSE: 15.0000 mL | Freq: Once | OROMUCOSAL | Status: AC

## 2023-12-06 MED ORDER — MIDAZOLAM HCL (PF) 2 MG/2ML IJ SOLN
INTRAMUSCULAR | Status: DC | PRN
  Administered 2023-12-06: 2 mg via INTRAVENOUS

## 2023-12-06 SURGICAL SUPPLY — 40 items
BLADE SHAVER TORPEDO 4X13 (MISCELLANEOUS) ×1 IMPLANT
BNDG COMPR ESMARK 6X3 LF (GAUZE/BANDAGES/DRESSINGS) IMPLANT
BNDG ELASTIC 4INX 5YD STR LF (GAUZE/BANDAGES/DRESSINGS) ×2 IMPLANT
BNDG ELASTIC 6INX 5YD STR LF (GAUZE/BANDAGES/DRESSINGS) ×3 IMPLANT
BURR OVAL 8 FLU 4.0X13 (MISCELLANEOUS) ×1 IMPLANT
CANNULA ACCUPORT 11G 120M END (CANNULA) ×1 IMPLANT
COOLER ICEMAN CLASSIC (MISCELLANEOUS) ×2 IMPLANT
CUFF TRNQT CYL 34X4.125X (TOURNIQUET CUFF) ×2 IMPLANT
DISSECTOR 3.8MM X 13CM (MISCELLANEOUS) ×1 IMPLANT
DRAPE ARTHROSCOPY W/POUCH 114 (DRAPES) ×2 IMPLANT
DRAPE C-ARM 42X120 X-RAY (DRAPES) ×2 IMPLANT
DRAPE C-ARMOR (DRAPES) ×2 IMPLANT
DRAPE IMP U-DRAPE 54X76 (DRAPES) IMPLANT
DRAPE U-SHAPE 47X51 STRL (DRAPES) ×2 IMPLANT
DURAPREP 26ML APPLICATOR (WOUND CARE) ×2 IMPLANT
ELECTRODE REM PT RTRN 9FT ADLT (ELECTROSURGICAL) IMPLANT
EXCALIBUR 3.8MM X 13CM (MISCELLANEOUS) IMPLANT
GAUZE PAD ABD 8X10 STRL (GAUZE/BANDAGES/DRESSINGS) ×1 IMPLANT
GAUZE SPONGE 4X4 12PLY STRL (GAUZE/BANDAGES/DRESSINGS) ×2 IMPLANT
GAUZE XEROFORM 1X8 LF (GAUZE/BANDAGES/DRESSINGS) ×2 IMPLANT
GLOVE BIOGEL PI IND STRL 7.5 (GLOVE) ×2 IMPLANT
GLOVE ECLIPSE 7.0 STRL STRAW (GLOVE) ×2 IMPLANT
GLOVE INDICATOR 7.0 STRL GRN (GLOVE) ×2 IMPLANT
GLOVE SURG SYN 7.5 PF PI (GLOVE) ×2 IMPLANT
GOWN STRL REUS W/ TWL LRG LVL3 (GOWN DISPOSABLE) ×2 IMPLANT
GOWN STRL REUS W/ TWL XL LVL3 (GOWN DISPOSABLE) ×2 IMPLANT
GOWN STRL SURGICAL XL XLNG (GOWN DISPOSABLE) ×2 IMPLANT
GRAFT FILLER BONE 5ML (Knees) ×1 IMPLANT
KIT BASIN OR (CUSTOM PROCEDURE TRAY) ×2 IMPLANT
MANIFOLD NEPTUNE II (INSTRUMENTS) ×2 IMPLANT
PACK ARTHROSCOPY DSU (CUSTOM PROCEDURE TRAY) ×2 IMPLANT
PAD COLD SHLDR WRAP-ON (PAD) ×2 IMPLANT
PADDING CAST COTTON 6X4 STRL (CAST SUPPLIES) ×1 IMPLANT
PENCIL SMOKE EVACUATOR (MISCELLANEOUS) IMPLANT
SHEET MEDIUM DRAPE 40X70 STRL (DRAPES) ×4 IMPLANT
SLEEVE SCD COMPRESS KNEE MED (STOCKING) ×2 IMPLANT
SUT ETHILON 3 0 PS 1 (SUTURE) ×2 IMPLANT
TOWEL GREEN STERILE FF (TOWEL DISPOSABLE) ×4 IMPLANT
TUBE CONNECTING 20X1/4 (TUBING) ×2 IMPLANT
TUBING ARTHROSCOPY IRRIG 16FT (MISCELLANEOUS) ×2 IMPLANT

## 2023-12-06 NOTE — Anesthesia Preprocedure Evaluation (Addendum)
 Anesthesia Evaluation  Patient identified by MRN, date of birth, ID band Patient awake    Reviewed: Allergy & Precautions, NPO status , Patient's Chart, lab work & pertinent test results  Airway Mallampati: III  TM Distance: >3 FB Neck ROM: Full    Dental  (+) Missing   Pulmonary sleep apnea and Continuous Positive Airway Pressure Ventilation    Pulmonary exam normal        Cardiovascular negative cardio ROS Normal cardiovascular exam     Neuro/Psych  PSYCHIATRIC DISORDERS      Brain injury    GI/Hepatic negative GI ROS, Neg liver ROS,,,  Endo/Other    Class 3 obesity  Renal/GU negative Renal ROS     Musculoskeletal  (+) Arthritis ,    Abdominal  (+) + obese  Peds  Hematology negative hematology ROS (+)   Anesthesia Other Findings right femur stress fracture, right tibia stress fracture  Reproductive/Obstetrics                              Anesthesia Physical Anesthesia Plan  ASA: 3  Anesthesia Plan: General   Post-op Pain Management:    Induction: Intravenous  PONV Risk Score and Plan: 3 and Ondansetron , Dexamethasone , Midazolam  and Treatment may vary due to age or medical condition  Airway Management Planned: LMA  Additional Equipment:   Intra-op Plan:   Post-operative Plan: Extubation in OR  Informed Consent: I have reviewed the patients History and Physical, chart, labs and discussed the procedure including the risks, benefits and alternatives for the proposed anesthesia with the patient or authorized representative who has indicated his/her understanding and acceptance.     Dental advisory given  Plan Discussed with: CRNA  Anesthesia Plan Comments:          Anesthesia Quick Evaluation

## 2023-12-06 NOTE — H&P (Signed)
 PREOPERATIVE H&P  Chief Complaint: right femur stress fracture, right tibia stress fracture  HPI: Lauren Moore is a 63 y.o. female who presents for surgical treatment of right femur stress fracture, right tibia stress fracture.  She denies any changes in medical history.  Past Surgical History:  Procedure Laterality Date  . BRAIN SURGERY    . COLONOSCOPY N/A 06/26/2012   Procedure: COLONOSCOPY;  Surgeon: Norleen JAYSON Hint, MD;  Location: [facility];  Service: Endoscopy;  Laterality: N/A;  . REVERSE SHOULDER ARTHROPLASTY Left 07/25/2022   Procedure: LEFT REVERSE SHOULDER ARTHROPLASTY;  Surgeon: Cristy Bonner DASEN, MD;  Location: [facility];  Service: Orthopedics;  Laterality: Left;  . TYMPANOPLASTY Right 11/25/2017   Procedure: TYMPANOPLASTY;  Surgeon: Jesus Oliphant, MD;  Location: [facility];  Service: ENT;  Laterality: Right;   Social History   Socioeconomic History  . Marital status: Single    Spouse name: Not on file  . Number of children: Not on file  . Years of education: Not on file  . Highest education level: Not on file  Occupational History  . Not on file  Tobacco Use  . Smoking status: Never  . Smokeless tobacco: Never  Vaping Use  . Vaping status: Never Used  Substance and Sexual Activity  . Alcohol use: Never  . Drug use: Never    Comment: states not smoked in one year  . Sexual activity: Not Currently  Other Topics Concern  . Not on file  Social History Narrative   ** Merged History Encounter **       Lives in boarding house with friends   Social Drivers of Health   Financial Resource Strain: Low Risk  (09/04/2023)   Received from [facility]   Overall Financial Resource Strain (CARDIA)   . How hard is it for you to pay for the very basics like food, housing, medical care, and heating?: Not very hard  Food Insecurity: No Food Insecurity (09/04/2023)   Received from [facility]   Hunger Vital Sign   . Within the past 12 months, you  worried that your food would run out before you got the money to buy more.: Never true   . Within the past 12 months, the food you bought just didn't last and you didn't have money to get more.: Never true  Transportation Needs: No Transportation Needs (09/04/2023)   Received from [facility] - Transportation   . In the past 12 months, has lack of transportation kept you from medical appointments or from getting medications?: No   . In the past 12 months, has lack of transportation kept you from meetings, work, or from getting things needed for daily living?: No  Physical Activity: Inactive (09/04/2023)   Received from [facility]   Exercise Vital Sign   . On average, how many days per week do you engage in moderate to strenuous exercise (like a brisk walk)?: 0 days   . Minutes of Exercise per Session: Not on file  Stress: No Stress Concern Present (09/04/2023)   Received from [facility] - Occupational Stress Questionnaire   . Do you feel stress - tense, restless, nervous, or anxious, or unable to sleep at night because your mind is troubled all the time - these days?: Not at all  Social Connections: Socially Integrated (09/04/2023)   Received from [facility]   Social Network   . How would you rate  your social network (family, work, friends)?: Good participation with social networks   Family History  Problem Relation Age of Onset  . Cancer Sister        Lung  . Hypertension Sister   . Diabetes Sister   . Cancer Brother        Lung  . Stroke Brother   . Hypertension Brother   . Colon cancer Neg Hx   . Colon polyps Neg Hx   . Rectal cancer Neg Hx   . Stomach cancer Neg Hx    No Known Allergies Prior to Admission medications   Medication Sig Start Date End Date Taking? Authorizing Provider  albuterol  (VENTOLIN  HFA) 108 (90 Base) MCG/ACT inhaler Inhale 1-2 puffs into the lungs every 6 (six) hours as needed for wheezing or  shortness of breath. 09/26/20  Yes Nche, Roselie Rockford, NP  docusate sodium  (COLACE) 100 MG capsule Take 1 capsule (100 mg total) by mouth daily as needed. 11/22/23 11/21/24  Jule Ronal CROME, PA-C  doxycycline (VIBRAMYCIN) 100 MG capsule Take 1 capsule (100 mg total) by mouth 2 (two) times daily. To be taken after surgery 11/22/23   Jule Ronal CROME, PA-C  meloxicam  (MOBIC ) 15 MG tablet Take 1 tablet (15 mg total) by mouth daily. 10/31/23   Lynwood Barter, DO  methocarbamol  (ROBAXIN ) 750 MG tablet Take 1 tablet (750 mg total) by mouth 3 (three) times daily as needed. 11/22/23   Jule Ronal CROME, PA-C  ondansetron  (ZOFRAN ) 4 MG tablet Take 1 tablet (4 mg total) by mouth every 8 (eight) hours as needed for nausea or vomiting. 11/22/23   Jule Ronal CROME, PA-C  oxyCODONE -acetaminophen  (PERCOCET) 5-325 MG tablet Take 1-2 tablets by mouth every 6 (six) hours as needed. To be taken after surgery 11/22/23   Jule Ronal CROME, PA-C     Positive ROS: All other systems have been reviewed and were otherwise negative with the exception of those mentioned in the HPI and as above.  Physical Exam: General: Alert, no acute distress Cardiovascular: No pedal edema Respiratory: No cyanosis, no use of accessory musculature GI: abdomen soft Skin: No lesions in the area of chief complaint Neurologic: Sensation intact distally Psychiatric: Patient is competent for consent with normal mood and affect Lymphatic: no lymphedema  MUSCULOSKELETAL: exam stable  Assessment: right femur stress fracture, right tibia stress fracture  Plan: Plan for Procedure(s): ARTHROSCOPY, KNEE, WITH SUBCHONDROPLASTY CHONDROPLASTY  The risks benefits and alternatives were discussed with the patient including but not limited to the risks of nonoperative treatment, versus surgical intervention including infection, bleeding, nerve injury,  blood clots, cardiopulmonary complications, morbidity, mortality, among others, and they were  willing to proceed.   Ozell Cummins, MD 12/06/2023 6:44 AM

## 2023-12-06 NOTE — Discharge Instructions (Signed)
 Post-operative patient instructions  Knee Arthroscopy   Ice:  Place intermittent ice or cooler pack over your knee, 30 minutes on and 30 minutes off.  Continue this for the first 72 hours after surgery, then save ice for use after therapy sessions or on more active days.   Weight:  You may bear weight on your leg as your symptoms allow. Crutches:  Use crutches (or walker) to assist in walking until told to discontinue by your physical therapist or physician. This will help to reduce pain. Strengthening:  Perform simple thigh squeezes (isometric quad contractions) and straight leg lifts as you are able (3 sets of 5 to 10 repetitions, 3 times a day).  For the leg lifts, have someone support under your ankle in the beginning until you have increased strength enough to do this on your own.  To help get started on thigh squeezes, place a pillow under your knee and push down on the pillow with back of knee (sometimes easier to do than with your leg fully straight). Motion:  Perform gentle knee motion as tolerated - this is gentle bending and straightening of the knee. Seated heel slides: you can start by sitting in a chair, remove your brace, and gently slide your heel back on the floor - allowing your knee to bend. Have someone help you straighten your knee (or use your other leg/foot hooked under your ankle.  Dressing:  Perform 1st dressing change at 2 days postoperative. A moderate amount of blood tinged drainage is to be expected.  So if you bleed through the dressing on the first or second day or if you have fevers, it is fine to change the dressing/check the wounds early and redress wound. Elevate your leg.  If it bleeds through again, or if the incisions are leaking frank blood, please call the office. May change dressing every 1-2 days thereafter to help watch wounds. You may place regular band-aids over the incisions.   Shower:  Light shower is ok after 2 days.  Please take shower, NO bath. Recover  with gauze and ace wrap to help keep wounds protected.   Pain medication:  A narcotic pain medication has been prescribed.  Take as directed.  Typically you need narcotic pain medication more regularly during the first 3 to 5 days after surgery.  Decrease your use of the medication as the pain improves.  Narcotics can sometimes cause constipation, even after a few doses.  If you have problems with constipation, you can take an over the counter stool softener or light laxative.  If you have persistent problems, please notify your physician's office. Physical therapy: Additional activity guidelines to be provided by your physician or physical therapist at follow-up visits.  Driving: Do not recommend driving x 1 week post surgical, especially if surgery performed on right side. Should not drive while taking narcotic pain medications. It typically takes at least 1 week to restore sufficient neuromuscular function for normal reaction times for driving safety.  Call [phone] for questions or problems. Evenings you will be forwarded to the hospital operator.  Ask for the orthopaedic physician on call. Please call if you experience:    Redness, foul smelling, or persistent drainage from the surgical site  worsening knee pain and swelling not responsive to medication  any calf pain and or swelling of the lower leg  temperatures greater than 101.5 F other questions or concerns  Per [facility] clinic policy, our goal is ensure optimal postoperative pain control  with a multimodal pain management strategy. For all [facility] patients, our goal is to wean post-operative narcotic medications by 6 weeks post-operatively. If this is not possible due to utilization of pain medication prior to surgery, your [facility] doctor will support your acute post-operative pain control for the first 6 weeks postoperatively, with a plan to transition you back to your primary pain team following that. [facility] will work to ensure a  Therapist, occupational.    Thank you for allowing us  to be a part of your care.

## 2023-12-06 NOTE — Anesthesia Procedure Notes (Signed)
 Procedure Name: LMA Insertion Date/Time: 12/06/2023 7:34 AM  Performed by: Nanci Riis, CRNAPre-anesthesia Checklist: Patient identified, Emergency Drugs available, Suction available and Patient being monitored Patient Re-evaluated:Patient Re-evaluated prior to induction Oxygen Delivery Method: Circle system utilized Preoxygenation: Pre-oxygenation with 100% oxygen Induction Type: IV induction LMA: LMA with gastric port inserted LMA Size: 4.0 Number of attempts: 1 Placement Confirmation: ETT inserted through vocal cords under direct vision, positive ETCO2 and breath sounds checked- equal and bilateral Tube secured with: Tape Dental Injury: Teeth and Oropharynx as per pre-operative assessment

## 2023-12-06 NOTE — Transfer of Care (Signed)
 Immediate Anesthesia Transfer of Care Note  Patient: Lauren Moore  Procedure(s) Performed: ARTHROSCOPY, KNEE, WITH SUBCHONDROPLASTY (Right: Knee)  Patient Location: PACU  Anesthesia Type:General  Level of Consciousness: drowsy and patient cooperative  Airway & Oxygen Therapy: Patient Spontanous Breathing  Post-op Assessment: Report given to RN and Post -op Vital signs reviewed and stable  Post vital signs: Reviewed and stable  Last Vitals:  Vitals Value Taken Time  BP 127/83 12/06/23 08:41  Temp    Pulse 71 12/06/23 08:44  Resp 15 12/06/23 08:44  SpO2 86 % 12/06/23 08:44  Vitals shown include unfiled device data.  Last Pain:  Vitals:   12/06/23 0617  TempSrc: Oral         Complications: No notable events documented.

## 2023-12-06 NOTE — Op Note (Addendum)
 Surgery Date: 12/06/2023  PREOPERATIVE DIAGNOSES:  1. Right knee lateral meniscus tear 2. Right knee chondromalacia 3. Right lateral femoral condyle and lateral tibial plateau insufficiency fractures  POSTOPERATIVE DIAGNOSES:  same  PROCEDURES PERFORMED:  1. Right knee arthroscopy with abrasion arthroplasty of lateral femoral condyle 2. Right knee arthroscopy with arthroscopic partial lateral meniscectomy 3. Right knee arthroscopically assisted subchondroplasty of lateral femoral condyle 4. Right knee arthroscopically assisted subchondroplasty of lateral tibial plateau through separate incision  SURGEON: N. Ozell Cummins, M.D.  ASSIST: Ronal Jacobsen [place], [place]; necessary for the timely completion of procedure and due to complexity of procedure.  ANESTHESIA:  general  FLUIDS: Per anesthesia record.   ESTIMATED BLOOD LOSS: minimal  Implant Name Type Inv. Item Serial No. Manufacturer Lot No. LRB No. Used Action  GRAFT FILLER BONE - [id number] Knees GRAFT FILLER BONE  [employer](ORTH,TRAU,BIO,SG) [id number] Right 1 Implanted    DESCRIPTION OF PROCEDURE: Ms. Scheff is a 63 y.o.-year-old female with above mentioned conditions. Full discussion held regarding risks benefits alternatives and complications related surgical intervention. Conservative care options reviewed. All questions answered.  The patient was identified in the preoperative holding area and the operative extremity was marked. The patient was brought to the operating room and transferred to operating table in a supine position. General anesthesia was induced by anesthesiology.    Standard anterolateral, anteromedial arthroscopy portals were obtained. The anteromedial portal was obtained with a spinal needle for localization under direct visualization with subsequent diagnostic findings.   Anteromedial and anterolateral chambers: mild synovitis Suprapatellar pouch and gutters: mild synovitis or debris. Patella  chondral surface: Grade 3 Trochlear chondral surface: Grade 4 Patellofemoral tracking: normal Medial meniscus: degenerative fraying, no tears.  Medial femoral condyle weight bearing surface: Grade 1 Medial tibial plateau: Grade 1 Anterior cruciate ligament:stable Posterior cruciate ligament:stable Lateral meniscus: horizontal tear posterior horn.   Lateral femoral condyle weight bearing surface: Grade 4, 5 mm wide vertical stripe Lateral tibial plateau: Grade 3  The lateral meniscus tear was debrided back to stable margins with a motorized shaver.  The root was intact.  There was a 5 mm wide vertical stripe of grade IV chondromalacia on the lateral femoral condyle weightbearing surface.  Abrasion arthroplasty was performed down to bleeding bone with a motorized bur.  The arthroscope was then removed from the joint and fluoroscopy was brought in.  Spinal needle was used for localization of the lateral tibial plateau and lateral femoral condyle insufficiency fractures.  2 stab incisions were made.  The trocar was then advanced into the lateral tibial plateau subchondral bone to the proper depth that corresponded to the preoperative MRI.  2.5 cc of calcium sulfate cement was injected into the insufficiency fracture in a retrograde fashion.  The trocar was then removed and through the other stab incision advanced into the lateral femoral condyle to the proper depth that corresponded to the preoperative MRI.  Another 2.5 cc of calcium sulfate cement was injected into the insufficiency fracture in a retrograde fashion.  The cement was allowed to harden.  The arthroscope was placed back into the knee joint.  A couple flecks of calcium sulfate cement was in the joint which  were removed with the motorized shaver.  Gutters were checked for loose bodies.  Excess fluid was removed from the knee joint.  Incisions were closed with interrupted nylon sutures.  Sterile dressings were applied.  Patient tolerated  procedure well had no immediate complications.  Jacobsen Grave was critical for opening,  closing, limb positioning, retracting and overall facilitation and timely completion of the procedure  DISPOSITION: The patient was awakened from general anesthetic, extubated, taken to the recovery room in medically stable condition, no apparent complications. The patient may be weightbearing as tolerated to the operative lower extremity.  Range of motion of right knee as tolerated.  Discharge home.  Follow-up in office in 1 week.  GEANNIE Ozell Cummins, MD [facility] 8:29 AM

## 2023-12-09 ENCOUNTER — Encounter (HOSPITAL_COMMUNITY): Payer: Self-pay | Admitting: Orthopaedic Surgery

## 2023-12-09 NOTE — Anesthesia Postprocedure Evaluation (Signed)
 Anesthesia Post Note  Patient: Lauren Moore  Procedure(s) Performed: ARTHROSCOPY, KNEE, WITH SUBCHONDROPLASTY (Right: Knee)     Patient location during evaluation: PACU Anesthesia Type: General Level of consciousness: awake Pain management: pain level controlled Vital Signs Assessment: post-procedure vital signs reviewed and stable Respiratory status: spontaneous breathing, nonlabored ventilation and respiratory function stable Cardiovascular status: blood pressure returned to baseline and stable Postop Assessment: no apparent nausea or vomiting Anesthetic complications: no   No notable events documented.  Last Vitals:  Vitals:   12/06/23 1030 12/06/23 1041  BP: (!) 175/87 (!) 154/94  Pulse: (!) 58 (!) 58  Resp: 17 16  Temp: (!) 36.4 C (!) 36.1 C  SpO2: 93% 100%    Last Pain:  Vitals:   12/06/23 1041  TempSrc: Temporal  PainSc: 6                  Brittanie Dosanjh P Immanuel Fedak

## 2023-12-11 ENCOUNTER — Ambulatory Visit

## 2023-12-13 ENCOUNTER — Ambulatory Visit

## 2023-12-13 ENCOUNTER — Ambulatory Visit (INDEPENDENT_AMBULATORY_CARE_PROVIDER_SITE_OTHER): Admitting: Physician Assistant

## 2023-12-13 DIAGNOSIS — Z9889 Other specified postprocedural states: Secondary | ICD-10-CM

## 2023-12-13 MED ORDER — HYDROCODONE-ACETAMINOPHEN 5-325 MG PO TABS
1.0000 | ORAL_TABLET | Freq: Two times a day (BID) | ORAL | 0 refills | Status: AC | PRN
Start: 2023-12-13 — End: ?

## 2023-12-13 NOTE — Progress Notes (Signed)
 Post-Op Visit Note   Patient: Lauren Moore           Date of Birth: 01-13-1961           MRN: [id number] Visit Date: 12/13/2023 PCP: Janna Ferrier, DO   Assessment & Plan:  Chief Complaint:  Chief Complaint  Patient presents with   Right Knee - Routine Post Op   Visit Diagnoses:  1. S/P right knee arthroscopy     Plan: Patient is a pleasant 63 year old female who comes in today 1 week status post right knee arthroscopic debridement lateral meniscus and subchondroplasty.  She has been doing well.  Her pain has improved.  She is currently ambulating with a walker which she states she was using prior to surgery.  Examination of her right knee reveals well healed surgical portals with nylon sutures in place.  No evidence of infection or cellulitis.  Calves are soft nontender.  She is neurovascular intact distally.  Today, portal sutures were removed and Steri-Strips applied.  Home exercise program provided.  Patient has expressed interest in outpatient physical therapy so referral has been made.  I have sent in a refill of pain meds.  She will follow-up in 5 weeks for recheck.  Call with concerns or questions.  Follow-Up Instructions: Return in about 5 weeks (around 01/17/2024).   Orders:  Orders Placed This Encounter  Procedures   Ambulatory referral to Physical Therapy   Meds ordered this encounter  Medications   HYDROcodone -acetaminophen  (NORCO/VICODIN) 5-325 MG tablet    Sig: Take 1 tablet by mouth 2 (two) times daily as needed.    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   OSA (obstructive sleep apnea) 01/02/2022   Screening for human immunodeficiency virus 10/30/2017   Pain of both hip joints 10/30/2017   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   Polyuria 10/30/2017   Weight gain 10/30/2017   Essential hypertension 10/30/2017   Other abnormal glucose 10/30/2017   Breast cancer screening by mammogram 10/30/2017   Conductive hearing loss of right ear 09/26/2017   Perforation of right tympanic membrane 09/26/2017   Rectal bleed 06/24/2012   Hypokalemia 06/24/2012   Thrombocytopenia 06/24/2012   Alcohol abuse 06/24/2012   Elevated liver enzymes 06/24/2012   Past Medical History:  Diagnosis Date   Arthritis    Brain injury (HCC)    Dyspnea    Obesity    Sleep apnea    cpap    Family History  Problem Relation Age of Onset   Cancer Sister        Lung   Hypertension Sister    Diabetes Sister    Cancer Brother        Lung   Stroke Brother    Hypertension Brother    Colon cancer Neg Hx    Colon polyps Neg Hx    Rectal cancer Neg Hx    Stomach cancer Neg Hx     Past Surgical History:  Procedure Laterality Date  BRAIN SURGERY     COLONOSCOPY N/A 06/26/2012   Procedure: COLONOSCOPY;  Surgeon: Norleen JAYSON Hint, MD;  Location: [facility];  Service: Endoscopy;  Laterality: N/A;   KNEE ARTHROSCOPY WITH SUBCHONDROPLASTY Right 12/06/2023   Procedure: ARTHROSCOPY, KNEE, WITH SUBCHONDROPLASTY;  Surgeon: Jerri Kay HERO, MD;  Location: [facility];  Service: Orthopedics;  Laterality: Right;  right knee scope chondroplasty, subchondroplasty of lateral femoral condyle, lateral tibia plateau   REVERSE SHOULDER ARTHROPLASTY Left 07/25/2022   Procedure: LEFT REVERSE SHOULDER ARTHROPLASTY;  Surgeon: Cristy Bonner DASEN, MD;  Location: [facility];  Service: Orthopedics;  Laterality: Left;   TYMPANOPLASTY Right 11/25/2017   Procedure: TYMPANOPLASTY;  Surgeon: Jesus Oliphant,  MD;  Location: [facility];  Service: ENT;  Laterality: Right;   Social History   Occupational History   Not on file  Tobacco Use   Smoking status: Never   Smokeless tobacco: Never  Vaping Use   Vaping status: Never Used  Substance and Sexual Activity   Alcohol use: Never   Drug use: Never    Comment: states not smoked in one year   Sexual activity: Not Currently

## 2023-12-16 ENCOUNTER — Other Ambulatory Visit: Payer: Self-pay

## 2023-12-18 NOTE — Therapy (Signed)
 OUTPATIENT PHYSICAL THERAPY LOWER EXTREMITY EVALUATION   Patient Name: Lauren Moore MRN: [id number] DOB:04-26-1960, 63 y.o., female Today's Date: 12/20/2023  END OF SESSION:  PT End of Session - 12/20/23 0741     Visit Number 1    Number of Visits 12    Date for Recertification  02/19/23    Authorization Type [employer]    Authorization Time Period no visit limit    PT Start Time 0745    PT Stop Time 0830    PT Time Calculation (min) 45 min    Activity Tolerance Patient tolerated treatment well          Past Medical History:  Diagnosis Date   Arthritis    Brain injury (HCC)    Dyspnea    Obesity    Sleep apnea    cpap   Past Surgical History:  Procedure Laterality Date   BRAIN SURGERY     COLONOSCOPY N/A 06/26/2012   Procedure: COLONOSCOPY;  Surgeon: Norleen JAYSON Hint, MD;  Location: [facility];  Service: Endoscopy;  Laterality: N/A;   KNEE ARTHROSCOPY WITH SUBCHONDROPLASTY Right 12/06/2023   Procedure: ARTHROSCOPY, KNEE, WITH SUBCHONDROPLASTY;  Surgeon: Jerri Kay HERO, MD;  Location: [facility];  Service: Orthopedics;  Laterality: Right;  right knee scope chondroplasty, subchondroplasty of lateral femoral condyle, lateral tibia plateau   REVERSE SHOULDER ARTHROPLASTY Left 07/25/2022   Procedure: LEFT REVERSE SHOULDER ARTHROPLASTY;  Surgeon: Cristy Bonner DASEN, MD;  Location: [facility];  Service: Orthopedics;  Laterality: Left;   TYMPANOPLASTY Right 11/25/2017   Procedure: TYMPANOPLASTY;  Surgeon: Jesus Oliphant, MD;  Location: [facility];  Service: ENT;  Laterality: Right;   Patient Active Problem List   Diagnosis Date Noted   Complex tear of lateral meniscus of right knee, initial encounter 12/06/2023   Chondromalacia, right knee 12/06/2023   Closed subchondral insufficiency fracture of condyle of right femur (HCC) 11/14/2023   Insufficiency fracture of tibia 11/14/2023   Body mass index 45.0-49.9, adult (HCC) 11/14/2023   Primary  osteoarthritis of right knee 10/18/2023   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   OSA (obstructive sleep apnea) 01/02/2022   Screening for human immunodeficiency virus 10/30/2017   Pain of both hip joints 10/30/2017   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   Polyuria 10/30/2017   Weight gain 10/30/2017   Essential hypertension 10/30/2017   Other abnormal glucose 10/30/2017   Breast cancer screening by mammogram 10/30/2017   Conductive hearing loss of right ear 09/26/2017   Perforation of right tympanic membrane 09/26/2017   Rectal bleed 06/24/2012   Hypokalemia 06/24/2012   Thrombocytopenia 06/24/2012   Alcohol  abuse 06/24/2012   Elevated liver enzymes 06/24/2012    PCP:  Janna Ferrier, DO   REFERRING PROVIDER: Jule Ronal CROME, PA-C  REFERRING DIAG: [phone] (ICD-10-CM) - S/P right knee arthroscopy  THERAPY DIAG:  Acute pain of right knee  Other abnormalities of gait and mobility  Muscle weakness (generalized)  Rationale for Evaluation and Treatment: Rehabilitation  ONSET DATE: DOS 12/06/23  SUBJECTIVE:   SUBJECTIVE STATEMENT: Reports R knee pain following recent arthroscopic surgery.  Arrives to [facility] w/o walker as sister took it away  PERTINENT HISTORY: Patient is a pleasant 63 year old female who comes in today 1 week status post right knee arthroscopic debridement lateral meniscus and subchondroplasty. She has been doing well. Her pain has improved. She is currently ambulating with a walker which she states she was using prior to surgery. Examination of her right knee  reveals well healed surgical portals with nylon sutures in place. No evidence of infection or cellulitis. Calves are soft nontender. She is neurovascular intact distally. Today, portal sutures were removed and Steri-Strips applied. Home exercise program provided. Patient has expressed interest in outpatient physical therapy so referral has been made. I have sent in a refill of pain  meds. She will follow-up in 5 weeks for recheck. Call with concerns or questions. PAIN:  Are you having pain? Yes: NPRS scale: 10/10 Pain location: R knee Pain description: ache, sore, throb Aggravating factors: walking, weather, prolonged positioning Relieving factors: rest  PRECAUTIONS: None  RED FLAGS: None   WEIGHT BEARING RESTRICTIONS: No  FALLS:  Has patient fallen in last 6 months? No  OCCUPATION: not working  PLOF: Independent  PATIENT GOALS: To manage my knee symptoms  NEXT MD VISIT: 01/28/24  OBJECTIVE:  Note: Objective measures were completed at Evaluation unless otherwise noted.  DIAGNOSTIC FINDINGS: none  PATIENT SURVEYS:  LEFS    MUSCLE LENGTH: deferred  POSTURE: marked B knee valgus  PALPATION: Not tested  LOWER EXTREMITY ROM:  Active ROM Right eval Left eval  Hip flexion    Hip extension    Hip abduction    Hip adduction    Hip internal rotation    Hip external rotation    Knee flexion 90dP!   Knee extension 0d   Ankle dorsiflexion    Ankle plantarflexion    Ankle inversion    Ankle eversion     (Blank rows = not tested)  LOWER EXTREMITY MMT:  MMT Right eval Left eval  Hip flexion    Hip extension    Hip abduction    Hip adduction    Hip internal rotation    Hip external rotation    Knee flexion 4-   Knee extension 4-   Ankle dorsiflexion WFL   Ankle plantarflexion WFL   Ankle inversion    Ankle eversion     (Blank rows = not tested)  LOWER EXTREMITY SPECIAL TESTS:  Deferred due to pain  FUNCTIONAL TESTS:  30 seconds chair stand test  GAIT: Distance walked: 21ftx2 Assistive device utilized: None used today but has walker at home Level of assistance: Complete Independence Comments: slow cadence, antalgic gait with wall surfing noted                                                                                                                                TREATMENT:  [facility] Adult PT Treatment:                                                 DATE: 12/20/23 Eval and HEP Self Care: Additional minutes spent for educating on updated Therapeutic Home Exercise Program as well as comparing current status to condition at start  of symptoms. This included exercises focusing on stretching, strengthening, with focus on eccentric aspects. Long term goals include an improvement in range of motion, strength, endurance as well as avoiding reinjury. Patient's frequency would include in 1-2 times a day, 3-5 times a week for a duration of 6-12 weeks. Proper technique shown and discussed handout in great detail. All questions were discussed and addressed.     PATIENT EDUCATION:  Education details: Discussed eval findings, rehab rationale and POC and patient is in agreement  Person educated: Patient Education method: Explanation and Handouts Education comprehension: verbalized understanding and needs further education  HOME EXERCISE PROGRAM: Access Code: [email or web address] URL: [email or web address] Date: 12/20/2023 Prepared by: Reyes Kohut  Exercises - Seated Heel Slide  - 5 x daily - 5 x weekly - 1 sets - 10 reps - Seated Long Arc Quad  - 5 x daily - 5 x weekly - 1 sets - 10 reps - Heel Toe Raises with Counter Support  - 5 x daily - 5 x weekly - 1 sets - 10 reps - Standing Heel Raise with Support  - 5 x daily - 5 x weekly - 1 sets - 10 reps - Standing Knee Flexion AROM with Chair Support  - 5 x daily - 5 x weekly - 1 sets - 10 reps  ASSESSMENT:  CLINICAL IMPRESSION: Patient is a 63 y.o. female who was seen today for physical therapy evaluation and treatment for R knee pain and stiffness following arthroscopic debridement.  Patient has a walker but her sister does not allow her to use it.  Due to pain and fall risk scope of assessment limited.  Portal sites inspected w/o issues noted.  Patient presents with full knee extension and 90d flexion.  Marked B valgus present.    OBJECTIVE IMPAIRMENTS: Abnormal  gait, decreased activity tolerance, decreased balance, decreased endurance, decreased knowledge of condition, decreased knowledge of use of DME, decreased mobility, difficulty walking, decreased ROM, decreased strength, decreased safety awareness, postural dysfunction, obesity, and pain.   ACTIVITY LIMITATIONS: carrying, lifting, sitting, standing, squatting, stairs, transfers, and bed mobility  PERSONAL FACTORS: Age, Fitness, and Past/current experiences are also affecting patient's functional outcome.   REHAB POTENTIAL: Good  CLINICAL DECISION MAKING: Stable/uncomplicated  EVALUATION COMPLEXITY: Moderate   GOALS: Goals reviewed with patient? No  SHORT TERM GOALS: Target date: 01/10/2024   Patient to demonstrate independence in HEP  Baseline:  [id number] Goal status: INITIAL  2.  Increase AROM R knee flexion to 120d Baseline: 90d Goal status: INITIAL  3.  Patient to use walker for mobility needs until cleared by PT Baseline: No AD used Goal status: INITIAL   LONG TERM GOALS: Target date: 02/14/2024    Patient will acknowledge 4/10 pain at least once during episode of care   Baseline: 8/10 Goal status: INITIAL  2.  Patient will score at least 15/50 on LEFS to signify clinically meaningful improvement in functional abilities.   Baseline: 25/50 Goal status: INITIAL  3.  Patient will increase 30s chair stand reps from 0 to 1 without arms to demonstrate and improved functional ability with less pain/difficulty as well as reduce fall risk.  Baseline:  Goal status: INITIAL  4.  Increase R knee strength to 4/5 Baseline: 4-/5 Goal status: INITIAL    PLAN:  PT FREQUENCY: 1-2x/week  PT DURATION: 6 weeks  PLANNED INTERVENTIONS: 97110-Therapeutic exercises, 97530- Therapeutic activity, [id number]- Neuromuscular re-education, 97535- Self Care, [id number]- Manual therapy, [phone]- Gait training, Patient/Family education, Balance training, and Stair training  PLAN FOR NEXT SESSION: HEP  review and update, manual techniques as appropriate, aerobic tasks, ROM and flexibility activities, strengthening and PREs, TPDN, gait and balance training,aquatic therapy, modalities for pain and NMRE    Date of referral: 12/13/2023 Referring provider: Jule Ronal CROME, PA-C Referring diagnosis? Z98.890 (ICD-10-CM) - S/P right knee arthroscopy Treatment diagnosis? (if different than referring diagnosis) R knee pain  What was this (referring dx) caused by? Surgery (Type: arthroscopy)  Nature of Condition: Initial Onset (within last 3 months)   Laterality: Rt  Current Functional Measure Score: LEFS 25/50  Objective measurements identify impairments when they are compared to normal values, the uninvolved extremity, and prior level of function.  [x]  Yes  []  No  Objective assessment of functional ability: Moderate functional limitations   Briefly describe symptoms: R knee pain, stiffness and weakness  How did symptoms start: gradual degeneration  Average pain intensity:  Last 24 hours: 8  Past week: 10  How often does the pt experience symptoms? Constantly  How much have the symptoms interfered with usual daily activities? Moderately  How has condition changed since care began at this facility? NA - initial visit  In general, how is the patients overall health? Good   BACK PAIN (STarT Back Screening Tool) No   Reyes CHRISTELLA Kohut, PT 12/20/2023, 7:51 AM

## 2023-12-20 ENCOUNTER — Ambulatory Visit: Attending: Physician Assistant

## 2023-12-20 ENCOUNTER — Other Ambulatory Visit: Payer: Self-pay

## 2023-12-20 DIAGNOSIS — R2689 Other abnormalities of gait and mobility: Secondary | ICD-10-CM | POA: Insufficient documentation

## 2023-12-20 DIAGNOSIS — Z9889 Other specified postprocedural states: Secondary | ICD-10-CM | POA: Diagnosis not present

## 2023-12-20 DIAGNOSIS — M25561 Pain in right knee: Secondary | ICD-10-CM | POA: Insufficient documentation

## 2023-12-20 DIAGNOSIS — M6281 Muscle weakness (generalized): Secondary | ICD-10-CM | POA: Insufficient documentation

## 2023-12-24 ENCOUNTER — Ambulatory Visit

## 2023-12-24 DIAGNOSIS — M25561 Pain in right knee: Secondary | ICD-10-CM

## 2023-12-24 DIAGNOSIS — R2689 Other abnormalities of gait and mobility: Secondary | ICD-10-CM

## 2023-12-24 DIAGNOSIS — M6281 Muscle weakness (generalized): Secondary | ICD-10-CM

## 2023-12-24 NOTE — Therapy (Signed)
 OUTPATIENT PHYSICAL THERAPY LOWER EXTREMITY   Patient Name: Lauren Moore MRN: [id number] DOB:[birth year], 63 y.o., female Today's Date: 12/24/2023  END OF SESSION:  PT End of Session - 12/24/23 0921     Visit Number 2    Number of Visits 12    Date for Recertification  02/19/23    Authorization Type [employer]    Authorization Time Period no visit limit    PT Start Time 0915    PT Stop Time 0955    PT Time Calculation (min) 40 min    Activity Tolerance Patient tolerated treatment well;Patient limited by fatigue    Behavior During Therapy [facility] for tasks assessed/performed          Past Medical History:  Diagnosis Date   Arthritis    Brain injury (HCC)    Dyspnea    Obesity    Sleep apnea    cpap   Past Surgical History:  Procedure Laterality Date   BRAIN SURGERY     COLONOSCOPY N/A 06/26/2012   Procedure: COLONOSCOPY;  Surgeon: Norleen JAYSON Hint, MD;  Location: [facility];  Service: Endoscopy;  Laterality: N/A;   KNEE ARTHROSCOPY WITH SUBCHONDROPLASTY Right 12/06/2023   Procedure: ARTHROSCOPY, KNEE, WITH SUBCHONDROPLASTY;  Surgeon: Jerri Kay HERO, MD;  Location: [facility];  Service: Orthopedics;  Laterality: Right;  right knee scope chondroplasty, subchondroplasty of lateral femoral condyle, lateral tibia plateau   REVERSE SHOULDER ARTHROPLASTY Left 07/25/2022   Procedure: LEFT REVERSE SHOULDER ARTHROPLASTY;  Surgeon: Cristy Bonner DASEN, MD;  Location: [facility];  Service: Orthopedics;  Laterality: Left;   TYMPANOPLASTY Right 11/25/2017   Procedure: TYMPANOPLASTY;  Surgeon: Jesus Oliphant, MD;  Location: [facility];  Service: ENT;  Laterality: Right;   Patient Active Problem List   Diagnosis Date Noted   Complex tear of lateral meniscus of right knee, initial encounter 12/06/2023   Chondromalacia, right knee 12/06/2023   Closed subchondral insufficiency fracture of condyle of right femur (HCC) 11/14/2023   Insufficiency fracture of tibia 11/14/2023    Body mass index 45.0-49.9, adult (HCC) 11/14/2023   Primary osteoarthritis of right knee 10/18/2023   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   OSA (obstructive sleep apnea) 01/02/2022   Screening for human immunodeficiency virus 10/30/2017   Pain of both hip joints 10/30/2017   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   Polyuria 10/30/2017   Weight gain 10/30/2017   Essential hypertension 10/30/2017   Other abnormal glucose 10/30/2017   Breast cancer screening by mammogram 10/30/2017   Conductive hearing loss of right ear 09/26/2017   Perforation of right tympanic membrane 09/26/2017   Rectal bleed 06/24/2012   Hypokalemia 06/24/2012   Thrombocytopenia 06/24/2012   Alcohol  abuse 06/24/2012   Elevated liver enzymes 06/24/2012    PCP:  Janna Ferrier, DO   REFERRING PROVIDER: Jule Ronal CROME, PA-C  REFERRING DIAG: [phone] (ICD-10-CM) - S/P right knee arthroscopy  THERAPY DIAG:  Acute pain of right knee  Other abnormalities of gait and mobility  Muscle weakness (generalized)  Rationale for Evaluation and Treatment: Rehabilitation  ONSET DATE: DOS 12/06/23  SUBJECTIVE:   SUBJECTIVE STATEMENT: Patient reports that she is having some pain and soreness, she states that she has been compliant with her HEP.  Reports R knee pain following recent arthroscopic surgery.  Arrives to [facility] w/o walker as sister took it away  PERTINENT HISTORY: Patient is a pleasant 63 year old female who comes in today 1 week status post right knee arthroscopic debridement lateral  meniscus and subchondroplasty. She has been doing well. Her pain has improved. She is currently ambulating with a walker which she states she was using prior to surgery. Examination of her right knee reveals well healed surgical portals with nylon sutures in place. No evidence of infection or cellulitis. Calves are soft nontender. She is neurovascular intact distally. Today, portal sutures were removed  and Steri-Strips applied. Home exercise program provided. Patient has expressed interest in outpatient physical therapy so referral has been made. I have sent in a refill of pain meds. She will follow-up in 5 weeks for recheck. Call with concerns or questions. PAIN:  Are you having pain? Yes: NPRS scale: 10/10 Pain location: R knee Pain description: ache, sore, throb Aggravating factors: walking, weather, prolonged positioning Relieving factors: rest  PRECAUTIONS: None  RED FLAGS: None   WEIGHT BEARING RESTRICTIONS: No  FALLS:  Has patient fallen in last 6 months? No  OCCUPATION: not working  PLOF: Independent  PATIENT GOALS: To manage my knee symptoms  NEXT MD VISIT: 01/28/24  OBJECTIVE:  Note: Objective measures were completed at Evaluation unless otherwise noted.  DIAGNOSTIC FINDINGS: none  PATIENT SURVEYS:  LEFS    MUSCLE LENGTH: deferred  POSTURE: marked B knee valgus  PALPATION: Not tested  LOWER EXTREMITY ROM:  Active ROM Right eval Left eval  Hip flexion    Hip extension    Hip abduction    Hip adduction    Hip internal rotation    Hip external rotation    Knee flexion 90dP!   Knee extension 0d   Ankle dorsiflexion    Ankle plantarflexion    Ankle inversion    Ankle eversion     (Blank rows = not tested)  LOWER EXTREMITY MMT:  MMT Right eval Left eval  Hip flexion    Hip extension    Hip abduction    Hip adduction    Hip internal rotation    Hip external rotation    Knee flexion 4-   Knee extension 4-   Ankle dorsiflexion WFL   Ankle plantarflexion WFL   Ankle inversion    Ankle eversion     (Blank rows = not tested)  LOWER EXTREMITY SPECIAL TESTS:  Deferred due to pain  FUNCTIONAL TESTS:  30 seconds chair stand test  GAIT: Distance walked: 22ftx2 Assistive device utilized: None used today but has walker at home Level of assistance: Complete Independence Comments: slow cadence, antalgic gait with wall surfing  noted                                                                                                                                TREATMENT:   [facility] Adult PT Treatment:  DATE: 12/24/23 Therapeutic Exercise: Nustep level 2 x 8 mins Seated LAQ 2x10  Seated Heel slides 2x10 Seated hamstring stretch 30 BIL Seated hamstring curls 2x20 RLE Therapeutic Activity: Standing knee flexion 2x5 STS x5   [facility] Adult PT Treatment:                                                DATE: 12/20/23 Eval and HEP Self Care: Additional minutes spent for educating on updated Therapeutic Home Exercise Program as well as comparing current status to condition at start of symptoms. This included exercises focusing on stretching, strengthening, with focus on eccentric aspects. Long term goals include an improvement in range of motion, strength, endurance as well as avoiding reinjury. Patient's frequency would include in 1-2 times a day, 3-5 times a week for a duration of 6-12 weeks. Proper technique shown and discussed handout in great detail. All questions were discussed and addressed.     PATIENT EDUCATION:  Education details: Discussed eval findings, rehab rationale and POC and patient is in agreement  Person educated: Patient Education method: Explanation and Handouts Education comprehension: verbalized understanding and needs further education  HOME EXERCISE PROGRAM: Access Code: [email or web address] URL: [email or web address] Date: 12/20/2023 Prepared by: Reyes Kohut  Exercises - Seated Heel Slide  - 5 x daily - 5 x weekly - 1 sets - 10 reps - Seated Long Arc Quad  - 5 x daily - 5 x weekly - 1 sets - 10 reps - Heel Toe Raises with Counter Support  - 5 x daily - 5 x weekly - 1 sets - 10 reps - Standing Heel Raise with Support  - 5 x daily - 5 x weekly - 1 sets - 10 reps - Standing Knee Flexion AROM with Chair Support  - 5 x daily - 5 x weekly - 1 sets  - 10 reps  ASSESSMENT:  CLINICAL IMPRESSION: Patient presents to PT reporting that she is having some pain today in the knee. She states she has been compliant with her HEP. Patient was able to perform all exercises with increased breaks due to fatigue. Patient needed cues for proper hand placement during sit to stand transfer. Patient will benefit from skilled PT in order to increase functional independence.   EVAL: Patient is a 63 y.o. female who was seen today for physical therapy evaluation and treatment for R knee pain and stiffness following arthroscopic debridement.  Patient has a walker but her sister does not allow her to use it.  Due to pain and fall risk scope of assessment limited.  Portal sites inspected w/o issues noted.  Patient presents with full knee extension and 90d flexion.  Marked B valgus present.    OBJECTIVE IMPAIRMENTS: Abnormal gait, decreased activity tolerance, decreased balance, decreased endurance, decreased knowledge of condition, decreased knowledge of use of DME, decreased mobility, difficulty walking, decreased ROM, decreased strength, decreased safety awareness, postural dysfunction, obesity, and pain.   ACTIVITY LIMITATIONS: carrying, lifting, sitting, standing, squatting, stairs, transfers, and bed mobility  PERSONAL FACTORS: Age, Fitness, and Past/current experiences are also affecting patient's functional outcome.   REHAB POTENTIAL: Good  CLINICAL DECISION MAKING: Stable/uncomplicated  EVALUATION COMPLEXITY: Moderate   GOALS: Goals reviewed with patient? No  SHORT TERM GOALS: Target date: 01/10/2024   Patient to demonstrate independence in HEP  Baseline:  [id number] Goal status: INITIAL  2.  Increase AROM R knee flexion to 120d Baseline: 90d Goal status: INITIAL  3.  Patient to use walker for mobility needs until cleared by PT Baseline: No AD used Goal status: INITIAL   LONG TERM GOALS: Target date: 02/14/2024    Patient will acknowledge  4/10 pain at least once during episode of care   Baseline: 8/10 Goal status: INITIAL  2.  Patient will score at least 15/50 on LEFS to signify clinically meaningful improvement in functional abilities.   Baseline: 25/50 Goal status: INITIAL  3.  Patient will increase 30s chair stand reps from 0 to 1 without arms to demonstrate and improved functional ability with less pain/difficulty as well as reduce fall risk.  Baseline:  Goal status: INITIAL  4.  Increase R knee strength to 4/5 Baseline: 4-/5 Goal status: INITIAL    PLAN:  PT FREQUENCY: 1-2x/week  PT DURATION: 6 weeks  PLANNED INTERVENTIONS: 97110-Therapeutic exercises, 97530- Therapeutic activity, 97112- Neuromuscular re-education, 97535- Self Care, [id number]- Manual therapy, [phone]- Gait training, Patient/Family education, Balance training, and Stair training  PLAN FOR NEXT SESSION: HEP review and update, manual techniques as appropriate, aerobic tasks, ROM and flexibility activities, strengthening and PREs, TPDN, gait and balance training,aquatic therapy, modalities for pain and NMRE    Date of referral: 12/13/2023 Referring provider: Jule Ronal CROME, PA-C Referring diagnosis? Z98.890 (ICD-10-CM) - S/P right knee arthroscopy Treatment diagnosis? (if different than referring diagnosis) R knee pain  What was this (referring dx) caused by? Surgery (Type: arthroscopy)  Nature of Condition: Initial Onset (within last 3 months)   Laterality: Rt  Current Functional Measure Score: LEFS 25/50  Objective measurements identify impairments when they are compared to normal values, the uninvolved extremity, and prior level of function.  [x]  Yes  []  No  Objective assessment of functional ability: Moderate functional limitations   Briefly describe symptoms: R knee pain, stiffness and weakness  How did symptoms start: gradual degeneration  Average pain intensity:  Last 24 hours: 8  Past week: 10  How often does the pt  experience symptoms? Constantly  How much have the symptoms interfered with usual daily activities? Moderately  How has condition changed since care began at this facility? NA - initial visit  In general, how is the patients overall health? Good   BACK PAIN (STarT Back Screening Tool) No   Shanda Code, SPTA 12/24/2023, 10:01 AM

## 2023-12-30 ENCOUNTER — Ambulatory Visit

## 2023-12-30 DIAGNOSIS — M25561 Pain in right knee: Secondary | ICD-10-CM | POA: Diagnosis present

## 2023-12-30 DIAGNOSIS — R2689 Other abnormalities of gait and mobility: Secondary | ICD-10-CM | POA: Insufficient documentation

## 2023-12-30 DIAGNOSIS — G8929 Other chronic pain: Secondary | ICD-10-CM | POA: Diagnosis present

## 2023-12-30 DIAGNOSIS — M6281 Muscle weakness (generalized): Secondary | ICD-10-CM | POA: Insufficient documentation

## 2023-12-30 NOTE — Therapy (Signed)
 OUTPATIENT PHYSICAL THERAPY LOWER EXTREMITY   Patient Name: Lauren Moore MRN: [id number] DOB:11-02-1960, 63 y.o., female Today's Date: 12/30/2023  END OF SESSION:  PT End of Session - 12/30/23 0912     Visit Number 3    Number of Visits 12    Date for Recertification  02/19/23    Authorization Type [employer]    Authorization Time Period no visit limit    PT Start Time 0915    PT Stop Time 0955    PT Time Calculation (min) 40 min    Activity Tolerance Patient tolerated treatment well;Patient limited by fatigue    Behavior During Therapy [facility] for tasks assessed/performed           Past Medical History:  Diagnosis Date   Arthritis    Brain injury (HCC)    Dyspnea    Obesity    Sleep apnea    cpap   Past Surgical History:  Procedure Laterality Date   BRAIN SURGERY     COLONOSCOPY N/A 06/26/2012   Procedure: COLONOSCOPY;  Surgeon: Norleen JAYSON Hint, MD;  Location: [facility];  Service: Endoscopy;  Laterality: N/A;   KNEE ARTHROSCOPY WITH SUBCHONDROPLASTY Right 12/06/2023   Procedure: ARTHROSCOPY, KNEE, WITH SUBCHONDROPLASTY;  Surgeon: Jerri Kay HERO, MD;  Location: [facility];  Service: Orthopedics;  Laterality: Right;  right knee scope chondroplasty, subchondroplasty of lateral femoral condyle, lateral tibia plateau   REVERSE SHOULDER ARTHROPLASTY Left 07/25/2022   Procedure: LEFT REVERSE SHOULDER ARTHROPLASTY;  Surgeon: Cristy Bonner DASEN, MD;  Location: [facility];  Service: Orthopedics;  Laterality: Left;   TYMPANOPLASTY Right 11/25/2017   Procedure: TYMPANOPLASTY;  Surgeon: Jesus Oliphant, MD;  Location: [facility];  Service: ENT;  Laterality: Right;   Patient Active Problem List   Diagnosis Date Noted   Complex tear of lateral meniscus of right knee, initial encounter 12/06/2023   Chondromalacia, right knee 12/06/2023   Closed subchondral insufficiency fracture of condyle of right femur (HCC) 11/14/2023   Insufficiency fracture of tibia  11/14/2023   Body mass index 45.0-49.9, adult (HCC) 11/14/2023   Primary osteoarthritis of right knee 10/18/2023   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   OSA (obstructive sleep apnea) 01/02/2022   Screening for human immunodeficiency virus 10/30/2017   Pain of both hip joints 10/30/2017   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   Polyuria 10/30/2017   Weight gain 10/30/2017   Essential hypertension 10/30/2017   Other abnormal glucose 10/30/2017   Breast cancer screening by mammogram 10/30/2017   Conductive hearing loss of right ear 09/26/2017   Perforation of right tympanic membrane 09/26/2017   Rectal bleed 06/24/2012   Hypokalemia 06/24/2012   Thrombocytopenia 06/24/2012   Alcohol  abuse 06/24/2012   Elevated liver enzymes 06/24/2012    PCP:  Janna Ferrier, DO   REFERRING PROVIDER: Jule Ronal CROME, PA-C  REFERRING DIAG: [phone] (ICD-10-CM) - S/P right knee arthroscopy  THERAPY DIAG:  Acute pain of right knee  Other abnormalities of gait and mobility  Muscle weakness (generalized)  Chronic pain of right knee  Rationale for Evaluation and Treatment: Rehabilitation  ONSET DATE: DOS 12/06/23  SUBJECTIVE:   SUBJECTIVE STATEMENT: Patient reports that she is having some pain in her knee, she stated that it's painful when she's walking. She states that she believes it is from her walking around a lot over the weekend.   Reports R knee pain following recent arthroscopic surgery.  Arrives to [facility] w/o walker as sister took it  away  PERTINENT HISTORY: Patient is a pleasant 62 year old female who comes in today 1 week status post right knee arthroscopic debridement lateral meniscus and subchondroplasty. She has been doing well. Her pain has improved. She is currently ambulating with a walker which she states she was using prior to surgery. Examination of her right knee reveals well healed surgical portals with nylon sutures in place. No evidence of  infection or cellulitis. Calves are soft nontender. She is neurovascular intact distally. Today, portal sutures were removed and Steri-Strips applied. Home exercise program provided. Patient has expressed interest in outpatient physical therapy so referral has been made. I have sent in a refill of pain meds. She will follow-up in 5 weeks for recheck. Call with concerns or questions. PAIN:  Are you having pain? Yes: NPRS scale: 10/10 Pain location: R knee Pain description: ache, sore, throb Aggravating factors: walking, weather, prolonged positioning Relieving factors: rest  PRECAUTIONS: None  RED FLAGS: None   WEIGHT BEARING RESTRICTIONS: No  FALLS:  Has patient fallen in last 6 months? No  OCCUPATION: not working  PLOF: Independent  PATIENT GOALS: To manage my knee symptoms  NEXT MD VISIT: 01/28/24  OBJECTIVE:  Note: Objective measures were completed at Evaluation unless otherwise noted.  DIAGNOSTIC FINDINGS: none  PATIENT SURVEYS:  LEFS    MUSCLE LENGTH: deferred  POSTURE: marked B knee valgus  PALPATION: Not tested  LOWER EXTREMITY ROM:  Active ROM Right eval Left eval  Hip flexion    Hip extension    Hip abduction    Hip adduction    Hip internal rotation    Hip external rotation    Knee flexion 90dP!   Knee extension 0d   Ankle dorsiflexion    Ankle plantarflexion    Ankle inversion    Ankle eversion     (Blank rows = not tested)  LOWER EXTREMITY MMT:  MMT Right eval Left eval  Hip flexion    Hip extension    Hip abduction    Hip adduction    Hip internal rotation    Hip external rotation    Knee flexion 4-   Knee extension 4-   Ankle dorsiflexion WFL   Ankle plantarflexion WFL   Ankle inversion    Ankle eversion     (Blank rows = not tested)  LOWER EXTREMITY SPECIAL TESTS:  Deferred due to pain  FUNCTIONAL TESTS:  30 seconds chair stand test  GAIT: Distance walked: 64ftx2 Assistive device utilized: None used today but has  walker at home Level of assistance: Complete Independence Comments: slow cadence, antalgic gait with wall surfing noted                                                                                                                                TREATMENT:  [facility] Adult PT Treatment:  DATE: 12/30/23 Therapeutic Exercise: Nustep level 2 x 8 mins Seated LAQ 2x10  Seated Heel slides 2x10 Seated hamstring curls 2x20 RLE RTB Therapeutic Activity: Standing knee flexion 2x10 STS 2x5   [facility] Adult PT Treatment:                                                DATE: 12/24/23 Therapeutic Exercise: Nustep level 2 x 8 mins Seated LAQ 2x10  Seated Heel slides 2x10 Seated hamstring stretch 30 BIL Seated hamstring curls 2x20 RLE Therapeutic Activity: Standing knee flexion 2x5 STS x5   [facility] Adult PT Treatment:                                                DATE: 12/20/23 Eval and HEP Self Care: Additional minutes spent for educating on updated Therapeutic Home Exercise Program as well as comparing current status to condition at start of symptoms. This included exercises focusing on stretching, strengthening, with focus on eccentric aspects. Long term goals include an improvement in range of motion, strength, endurance as well as avoiding reinjury. Patient's frequency would include in 1-2 times a day, 3-5 times a week for a duration of 6-12 weeks. Proper technique shown and discussed handout in great detail. All questions were discussed and addressed.     PATIENT EDUCATION:  Education details: Discussed eval findings, rehab rationale and POC and patient is in agreement  Person educated: Patient Education method: Explanation and Handouts Education comprehension: verbalized understanding and needs further education  HOME EXERCISE PROGRAM: Access Code: [email or web address] URL: [email or web address] Date: 12/20/2023 Prepared by: Reyes Kohut  Exercises - Seated Heel Slide  - 5 x daily - 5 x weekly - 1 sets - 10 reps - Seated Long Arc Quad  - 5 x daily - 5 x weekly - 1 sets - 10 reps - Heel Toe Raises with Counter Support  - 5 x daily - 5 x weekly - 1 sets - 10 reps - Standing Heel Raise with Support  - 5 x daily - 5 x weekly - 1 sets - 10 reps - Standing Knee Flexion AROM with Chair Support  - 5 x daily - 5 x weekly - 1 sets - 10 reps  ASSESSMENT:  CLINICAL IMPRESSION: Patient presents to PT reporting that she is having increased pain in her knee today, particularity when ambulating. Today's session focused on increasing knee flexibility and strength. Patient was able to perform all exercises but needed frequent rest breaks due to pain and fatigue. Patient stated that there is increased pain with knee flexion, but has relief when the knee extends. Patient will benefit from skilled PT in order to increase functional independence.   EVAL: Patient is a 63 y.o. female who was seen today for physical therapy evaluation and treatment for R knee pain and stiffness following arthroscopic debridement.  Patient has a walker but her sister does not allow her to use it.  Due to pain and fall risk scope of assessment limited.  Portal sites inspected w/o issues noted.  Patient presents with full knee extension and 90d flexion.  Marked B valgus present.    OBJECTIVE IMPAIRMENTS: Abnormal gait, decreased activity tolerance, decreased balance, decreased endurance, decreased knowledge of condition,  decreased knowledge of use of DME, decreased mobility, difficulty walking, decreased ROM, decreased strength, decreased safety awareness, postural dysfunction, obesity, and pain.   ACTIVITY LIMITATIONS: carrying, lifting, sitting, standing, squatting, stairs, transfers, and bed mobility  PERSONAL FACTORS: Age, Fitness, and Past/current experiences are also affecting patient's functional outcome.   REHAB POTENTIAL: Good  CLINICAL DECISION MAKING:  Stable/uncomplicated  EVALUATION COMPLEXITY: Moderate   GOALS: Goals reviewed with patient? No  SHORT TERM GOALS: Target date: 01/10/2024   Patient to demonstrate independence in HEP  Baseline:  [id number] Goal status: INITIAL  2.  Increase AROM R knee flexion to 120d Baseline: 90d Goal status: INITIAL  3.  Patient to use walker for mobility needs until cleared by PT Baseline: No AD used Goal status: INITIAL   LONG TERM GOALS: Target date: 02/14/2024    Patient will acknowledge 4/10 pain at least once during episode of care   Baseline: 8/10 Goal status: INITIAL  2.  Patient will score at least 15/50 on LEFS to signify clinically meaningful improvement in functional abilities.   Baseline: 25/50 Goal status: INITIAL  3.  Patient will increase 30s chair stand reps from 0 to 1 without arms to demonstrate and improved functional ability with less pain/difficulty as well as reduce fall risk.  Baseline:  Goal status: INITIAL  4.  Increase R knee strength to 4/5 Baseline: 4-/5 Goal status: INITIAL    PLAN:  PT FREQUENCY: 1-2x/week  PT DURATION: 6 weeks  PLANNED INTERVENTIONS: 97110-Therapeutic exercises, 97530- Therapeutic activity, 97112- Neuromuscular re-education, 97535- Self Care, 02859- Manual therapy, [phone]- Gait training, Patient/Family education, Balance training, and Stair training  PLAN FOR NEXT SESSION: HEP review and update, manual techniques as appropriate, aerobic tasks, ROM and flexibility activities, strengthening and PREs, TPDN, gait and balance training,aquatic therapy, modalities for pain and NMRE    Date of referral: 12/13/2023 Referring provider: Jule Ronal CROME, PA-C Referring diagnosis? Z98.890 (ICD-10-CM) - S/P right knee arthroscopy Treatment diagnosis? (if different than referring diagnosis) R knee pain  What was this (referring dx) caused by? Surgery (Type: arthroscopy)  Nature of Condition: Initial Onset (within last 3  months)   Laterality: Rt  Current Functional Measure Score: LEFS 25/50  Objective measurements identify impairments when they are compared to normal values, the uninvolved extremity, and prior level of function.  [x]  Yes  []  No  Objective assessment of functional ability: Moderate functional limitations   Briefly describe symptoms: R knee pain, stiffness and weakness  How did symptoms start: gradual degeneration  Average pain intensity:  Last 24 hours: 8  Past week: 10  How often does the pt experience symptoms? Constantly  How much have the symptoms interfered with usual daily activities? Moderately  How has condition changed since care began at this facility? NA - initial visit  In general, how is the patients overall health? Good   BACK PAIN (STarT Back Screening Tool) No   Shanda Code, SPTA 12/30/2023, 9:13 AM

## 2024-01-01 ENCOUNTER — Ambulatory Visit

## 2024-01-01 DIAGNOSIS — M6281 Muscle weakness (generalized): Secondary | ICD-10-CM

## 2024-01-01 DIAGNOSIS — R2689 Other abnormalities of gait and mobility: Secondary | ICD-10-CM

## 2024-01-01 DIAGNOSIS — M25561 Pain in right knee: Secondary | ICD-10-CM | POA: Diagnosis not present

## 2024-01-01 NOTE — Therapy (Signed)
 OUTPATIENT PHYSICAL THERAPY TREATMENT NOTE   Patient Name: Lauren Moore MRN: [id number] DOB:1960-04-14, 63 y.o., female Today's Date: 01/01/2024  END OF SESSION:  PT End of Session - 01/01/24 0856     Visit Number 4    Number of Visits 12    Date for Recertification  02/19/23    Authorization Type [employer]    Authorization Time Period no visit limit    PT Start Time 0910    PT Stop Time 0952    PT Time Calculation (min) 42 min    Activity Tolerance Patient tolerated treatment well;Patient limited by fatigue;Patient limited by pain    Behavior During Therapy [facility] for tasks assessed/performed            Past Medical History:  Diagnosis Date   Arthritis    Brain injury (HCC)    Dyspnea    Obesity    Sleep apnea    cpap   Past Surgical History:  Procedure Laterality Date   BRAIN SURGERY     COLONOSCOPY N/A 06/26/2012   Procedure: COLONOSCOPY;  Surgeon: Norleen JAYSON Hint, MD;  Location: [facility];  Service: Endoscopy;  Laterality: N/A;   KNEE ARTHROSCOPY WITH SUBCHONDROPLASTY Right 12/06/2023   Procedure: ARTHROSCOPY, KNEE, WITH SUBCHONDROPLASTY;  Surgeon: Jerri Kay HERO, MD;  Location: [facility];  Service: Orthopedics;  Laterality: Right;  right knee scope chondroplasty, subchondroplasty of lateral femoral condyle, lateral tibia plateau   REVERSE SHOULDER ARTHROPLASTY Left 07/25/2022   Procedure: LEFT REVERSE SHOULDER ARTHROPLASTY;  Surgeon: Cristy Bonner DASEN, MD;  Location: [facility];  Service: Orthopedics;  Laterality: Left;   TYMPANOPLASTY Right 11/25/2017   Procedure: TYMPANOPLASTY;  Surgeon: Jesus Oliphant, MD;  Location: [facility];  Service: ENT;  Laterality: Right;   Patient Active Problem List   Diagnosis Date Noted   Complex tear of lateral meniscus of right knee, initial encounter 12/06/2023   Chondromalacia, right knee 12/06/2023   Closed subchondral insufficiency fracture of condyle of right femur (HCC) 11/14/2023   Insufficiency  fracture of tibia 11/14/2023   Body mass index 45.0-49.9, adult (HCC) 11/14/2023   Primary osteoarthritis of right knee 10/18/2023   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   OSA (obstructive sleep apnea) 01/02/2022   Screening for human immunodeficiency virus 10/30/2017   Pain of both hip joints 10/30/2017   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   Polyuria 10/30/2017   Weight gain 10/30/2017   Essential hypertension 10/30/2017   Other abnormal glucose 10/30/2017   Breast cancer screening by mammogram 10/30/2017   Conductive hearing loss of right ear 09/26/2017   Perforation of right tympanic membrane 09/26/2017   Rectal bleed 06/24/2012   Hypokalemia 06/24/2012   Thrombocytopenia 06/24/2012   Alcohol  abuse 06/24/2012   Elevated liver enzymes 06/24/2012    PCP:  Janna Ferrier, DO   REFERRING PROVIDER: Jule Ronal CROME, PA-C  REFERRING DIAG: [phone] (ICD-10-CM) - S/P right knee arthroscopy  THERAPY DIAG:  Acute pain of right knee  Other abnormalities of gait and mobility  Muscle weakness (generalized)  Rationale for Evaluation and Treatment: Rehabilitation  ONSET DATE: DOS 12/06/23  NEXT MD APPT: 01/28/24 Ronal Jule PA-C  SUBJECTIVE:   SUBJECTIVE STATEMENT: Patient reports that her knee is bothering her a lot today, states that it hurts from previous session.    EVAL: Reports R knee pain following recent arthroscopic surgery.  Arrives to [facility] w/o walker as sister took it away  PERTINENT HISTORY: Patient is a pleasant 63 year old  female who comes in today 1 week status post right knee arthroscopic debridement lateral meniscus and subchondroplasty. She has been doing well. Her pain has improved. She is currently ambulating with a walker which she states she was using prior to surgery. Examination of her right knee reveals well healed surgical portals with nylon sutures in place. No evidence of infection or cellulitis. Calves are soft  nontender. She is neurovascular intact distally. Today, portal sutures were removed and Steri-Strips applied. Home exercise program provided. Patient has expressed interest in outpatient physical therapy so referral has been made. I have sent in a refill of pain meds. She will follow-up in 5 weeks for recheck. Call with concerns or questions. PAIN:  Are you having pain? Yes: NPRS scale: 10/10 Pain location: R knee Pain description: ache, sore, throb Aggravating factors: walking, weather, prolonged positioning Relieving factors: rest  PRECAUTIONS: None  RED FLAGS: None   WEIGHT BEARING RESTRICTIONS: No  FALLS:  Has patient fallen in last 6 months? No  OCCUPATION: not working  PLOF: Independent  PATIENT GOALS: To manage my knee symptoms  NEXT MD VISIT: 01/28/24  OBJECTIVE:  Note: Objective measures were completed at Evaluation unless otherwise noted.  DIAGNOSTIC FINDINGS: none  PATIENT SURVEYS:  LEFS    MUSCLE LENGTH: deferred  POSTURE: marked B knee valgus  PALPATION: Not tested  LOWER EXTREMITY ROM:  Active ROM Right eval Left eval  Hip flexion    Hip extension    Hip abduction    Hip adduction    Hip internal rotation    Hip external rotation    Knee flexion 90dP!   Knee extension 0d   Ankle dorsiflexion    Ankle plantarflexion    Ankle inversion    Ankle eversion     (Blank rows = not tested)  LOWER EXTREMITY MMT:  MMT Right eval Left eval  Hip flexion    Hip extension    Hip abduction    Hip adduction    Hip internal rotation    Hip external rotation    Knee flexion 4-   Knee extension 4-   Ankle dorsiflexion WFL   Ankle plantarflexion WFL   Ankle inversion    Ankle eversion     (Blank rows = not tested)  LOWER EXTREMITY SPECIAL TESTS:  Deferred due to pain  FUNCTIONAL TESTS:  30 seconds chair stand test  GAIT: Distance walked: 35ftx2 Assistive device utilized: None used today but has walker at home Level of assistance:  Complete Independence Comments: slow cadence, antalgic gait with wall surfing noted                                                                                                                               TREATMENT:  [facility] Adult PT Treatment:  DATE: 01/01/24 Therapeutic Exercise: Nustep level 2 x 8 mins Seated LAQ RLE 2x10  Seated Heel slides 2x10 Seated hamstring curls 2x10 RLE RTB Seated hip adduction ball squeeze 5 hold 2x10 Therapeutic Activity: Standing knee flexion RLE 2x10 STS x5   [facility] Adult PT Treatment:                                                DATE: 12/30/23 Therapeutic Exercise: Nustep level 2 x 8 mins Seated LAQ 2x10  Seated Heel slides 2x10 Seated hamstring curls 2x20 RLE RTB Therapeutic Activity: Standing knee flexion 2x10 STS 2x5   [facility] Adult PT Treatment:                                                DATE: 12/24/23 Therapeutic Exercise: Nustep level 2 x 8 mins Seated LAQ 2x10  Seated Heel slides 2x10 Seated hamstring stretch 30 BIL Seated hamstring curls 2x20 RLE Therapeutic Activity: Standing knee flexion 2x5 STS x5     PATIENT EDUCATION:  Education details: Discussed eval findings, rehab rationale and POC and patient is in agreement  Person educated: Patient Education method: Explanation and Handouts Education comprehension: verbalized understanding and needs further education  HOME EXERCISE PROGRAM: Access Code: [email or web address] URL: [email or web address] Date: 12/20/2023 Prepared by: Reyes Kohut  Exercises - Seated Heel Slide  - 5 x daily - 5 x weekly - 1 sets - 10 reps - Seated Long Arc Quad  - 5 x daily - 5 x weekly - 1 sets - 10 reps - Heel Toe Raises with Counter Support  - 5 x daily - 5 x weekly - 1 sets - 10 reps - Standing Heel Raise with Support  - 5 x daily - 5 x weekly - 1 sets - 10 reps - Standing Knee Flexion AROM with Chair Support  - 5 x daily - 5 x weekly - 1  sets - 10 reps  ASSESSMENT:  CLINICAL IMPRESSION: Patient presents to PT reporting increased pain in her knee, ambulating slowly with shuffle gait, very little heel strike, in her FWW. Session today continued to focus on knee ROM and gentle strengthening while also trying to improve standing activity tolerance as she is able. She is somewhat limited by pain during session, needed frequent rest breaks to recover. Patient continues to benefit from skilled PT services and should be progressed as able to improve functional independence.   EVAL: Patient is a 63 y.o. female who was seen today for physical therapy evaluation and treatment for R knee pain and stiffness following arthroscopic debridement.  Patient has a walker but her sister does not allow her to use it.  Due to pain and fall risk scope of assessment limited.  Portal sites inspected w/o issues noted.  Patient presents with full knee extension and 90d flexion.  Marked B valgus present.    OBJECTIVE IMPAIRMENTS: Abnormal gait, decreased activity tolerance, decreased balance, decreased endurance, decreased knowledge of condition, decreased knowledge of use of DME, decreased mobility, difficulty walking, decreased ROM, decreased strength, decreased safety awareness, postural dysfunction, obesity, and pain.   ACTIVITY LIMITATIONS: carrying, lifting, sitting, standing, squatting, stairs, transfers, and bed mobility  PERSONAL FACTORS: Age, Fitness, and Past/current experiences are also affecting  patient's functional outcome.   REHAB POTENTIAL: Good  CLINICAL DECISION MAKING: Stable/uncomplicated  EVALUATION COMPLEXITY: Moderate   GOALS: Goals reviewed with patient? No  SHORT TERM GOALS: Target date: 01/10/2024   Patient to demonstrate independence in HEP  Baseline:  [id number] Goal status: MET Pt reports adherence 01/01/24  2.  Increase AROM R knee flexion to 120d Baseline: 90d Goal status: INITIAL  3.  Patient to use walker for  mobility needs until cleared by PT Baseline: No AD used Goal status: MET 12/31/33: Pt utilizing walker   LONG TERM GOALS: Target date: 02/14/2024    Patient will acknowledge 4/10 pain at least once during episode of care   Baseline: 8/10 Goal status: INITIAL  2.  Patient will score at least 15/50 on LEFS to signify clinically meaningful improvement in functional abilities.   Baseline: 25/50 Goal status: INITIAL  3.  Patient will increase 30s chair stand reps from 0 to 1 without arms to demonstrate and improved functional ability with less pain/difficulty as well as reduce fall risk.  Baseline:  Goal status: INITIAL  4.  Increase R knee strength to 4/5 Baseline: 4-/5 Goal status: INITIAL    PLAN:  PT FREQUENCY: 1-2x/week  PT DURATION: 6 weeks  PLANNED INTERVENTIONS: 97110-Therapeutic exercises, 97530- Therapeutic activity, 97112- Neuromuscular re-education, 97535- Self Care, [id number]- Manual therapy, [phone]- Gait training, Patient/Family education, Balance training, and Stair training  PLAN FOR NEXT SESSION: HEP review and update, manual techniques as appropriate, aerobic tasks, ROM and flexibility activities, strengthening and PREs, TPDN, gait and balance training,aquatic therapy, modalities for pain and NMRE    Date of referral: 12/13/2023 Referring provider: Jule Ronal CROME, PA-C Referring diagnosis? Z98.890 (ICD-10-CM) - S/P right knee arthroscopy Treatment diagnosis? (if different than referring diagnosis) R knee pain  What was this (referring dx) caused by? Surgery (Type: arthroscopy)  Nature of Condition: Initial Onset (within last 3 months)   Laterality: Rt  Current Functional Measure Score: LEFS 25/50  Objective measurements identify impairments when they are compared to normal values, the uninvolved extremity, and prior level of function.  [x]  Yes  []  No  Objective assessment of functional ability: Moderate functional limitations   Briefly describe  symptoms: R knee pain, stiffness and weakness  How did symptoms start: gradual degeneration  Average pain intensity:  Last 24 hours: 8  Past week: 10  How often does the pt experience symptoms? Constantly  How much have the symptoms interfered with usual daily activities? Moderately  How has condition changed since care began at this facility? NA - initial visit  In general, how is the patients overall health? Good   BACK PAIN (STarT Back Screening Tool) No   Corean Pouch, PTA 01/01/2024, 9:54 AM

## 2024-01-03 NOTE — Therapy (Signed)
 OUTPATIENT PHYSICAL THERAPY TREATMENT NOTE   Patient Name: Lauren Moore MRN: [id number] DOB:31-Jan-1960, 63 y.o., female Today's Date: 01/06/2024  END OF SESSION:  PT End of Session - 01/06/24 0915     Visit Number 5    Number of Visits 12    Date for Recertification  02/19/23    Authorization Type [employer]    Authorization Time Period no visit limit    PT Start Time 0915    PT Stop Time 0955    PT Time Calculation (min) 40 min    Activity Tolerance Patient tolerated treatment well;Patient limited by fatigue;Patient limited by pain    Behavior During Therapy [facility] for tasks assessed/performed             Past Medical History:  Diagnosis Date   Arthritis    Brain injury (HCC)    Dyspnea    Obesity    Sleep apnea    cpap   Past Surgical History:  Procedure Laterality Date   BRAIN SURGERY     COLONOSCOPY N/A 06/26/2012   Procedure: COLONOSCOPY;  Surgeon: Norleen JAYSON Hint, MD;  Location: [facility];  Service: Endoscopy;  Laterality: N/A;   KNEE ARTHROSCOPY WITH SUBCHONDROPLASTY Right 12/06/2023   Procedure: ARTHROSCOPY, KNEE, WITH SUBCHONDROPLASTY;  Surgeon: Jerri Kay HERO, MD;  Location: [facility];  Service: Orthopedics;  Laterality: Right;  right knee scope chondroplasty, subchondroplasty of lateral femoral condyle, lateral tibia plateau   REVERSE SHOULDER ARTHROPLASTY Left 07/25/2022   Procedure: LEFT REVERSE SHOULDER ARTHROPLASTY;  Surgeon: Cristy Bonner DASEN, MD;  Location: [facility];  Service: Orthopedics;  Laterality: Left;   TYMPANOPLASTY Right 11/25/2017   Procedure: TYMPANOPLASTY;  Surgeon: Jesus Oliphant, MD;  Location: [facility];  Service: ENT;  Laterality: Right;   Patient Active Problem List   Diagnosis Date Noted   Complex tear of lateral meniscus of right knee, initial encounter 12/06/2023   Chondromalacia, right knee 12/06/2023   Closed subchondral insufficiency fracture of condyle of right femur (HCC) 11/14/2023   Insufficiency  fracture of tibia 11/14/2023   Body mass index 45.0-49.9, adult (HCC) 11/14/2023   Primary osteoarthritis of right knee 10/18/2023   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   OSA (obstructive sleep apnea) 01/02/2022   Screening for human immunodeficiency virus 10/30/2017   Pain of both hip joints 10/30/2017   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   Polyuria 10/30/2017   Weight gain 10/30/2017   Essential hypertension 10/30/2017   Other abnormal glucose 10/30/2017   Breast cancer screening by mammogram 10/30/2017   Conductive hearing loss of right ear 09/26/2017   Perforation of right tympanic membrane 09/26/2017   Rectal bleed 06/24/2012   Hypokalemia 06/24/2012   Thrombocytopenia 06/24/2012   Alcohol  abuse 06/24/2012   Elevated liver enzymes 06/24/2012    PCP:  Janna Ferrier, DO   REFERRING PROVIDER: Jule Ronal CROME, PA-C  REFERRING DIAG: [phone] (ICD-10-CM) - S/P right knee arthroscopy  THERAPY DIAG:  Acute pain of right knee  Other abnormalities of gait and mobility  Muscle weakness (generalized)  Rationale for Evaluation and Treatment: Rehabilitation  ONSET DATE: DOS 12/06/23  NEXT MD APPT: 01/28/24 Ronal Jule PA-C  SUBJECTIVE:   SUBJECTIVE STATEMENT:  Continues to report R knee pain and discomfort, 9/10 at times.  Both better and worse with exercises(?).     EVAL: Reports R knee pain following recent arthroscopic surgery.  Arrives to [facility] w/o walker as sister took it away  PERTINENT HISTORY: Patient is  a pleasant 63 year old female who comes in today 1 week status post right knee arthroscopic debridement lateral meniscus and subchondroplasty. She has been doing well. Her pain has improved. She is currently ambulating with a walker which she states she was using prior to surgery. Examination of her right knee reveals well healed surgical portals with nylon sutures in place. No evidence of infection or cellulitis. Calves are soft  nontender. She is neurovascular intact distally. Today, portal sutures were removed and Steri-Strips applied. Home exercise program provided. Patient has expressed interest in outpatient physical therapy so referral has been made. I have sent in a refill of pain meds. She will follow-up in 5 weeks for recheck. Call with concerns or questions. PAIN:  Are you having pain? Yes: NPRS scale: 10/10 Pain location: R knee Pain description: ache, sore, throb Aggravating factors: walking, weather, prolonged positioning Relieving factors: rest  PRECAUTIONS: None  RED FLAGS: None   WEIGHT BEARING RESTRICTIONS: No  FALLS:  Has patient fallen in last 6 months? No  OCCUPATION: not working  PLOF: Independent  PATIENT GOALS: To manage my knee symptoms  NEXT MD VISIT: 01/28/24  OBJECTIVE:  Note: Objective measures were completed at Evaluation unless otherwise noted.  DIAGNOSTIC FINDINGS: none  PATIENT SURVEYS:  LEFS    MUSCLE LENGTH: deferred  POSTURE: marked B knee valgus  PALPATION: Not tested  LOWER EXTREMITY ROM:  Active ROM Right eval Left eval  Hip flexion    Hip extension    Hip abduction    Hip adduction    Hip internal rotation    Hip external rotation    Knee flexion 90dP!   Knee extension 0d   Ankle dorsiflexion    Ankle plantarflexion    Ankle inversion    Ankle eversion     (Blank rows = not tested)  LOWER EXTREMITY MMT:  MMT Right eval Left eval  Hip flexion    Hip extension    Hip abduction    Hip adduction    Hip internal rotation    Hip external rotation    Knee flexion 4-   Knee extension 4-   Ankle dorsiflexion WFL   Ankle plantarflexion WFL   Ankle inversion    Ankle eversion     (Blank rows = not tested)  LOWER EXTREMITY SPECIAL TESTS:  Deferred due to pain  FUNCTIONAL TESTS:  30 seconds chair stand test  GAIT: Distance walked: 25ftx2 Assistive device utilized: None used today but has walker at home Level of assistance:  Complete Independence Comments: slow cadence, antalgic gait with wall surfing noted                                                                                                                               TREATMENT:  [facility] Adult PT Treatment:  DATE: 01/06/24 Therapeutic Exercise: Nustep L4 8 min Neuromuscular re-ed: Standing heel raises 15x Standing toe raises 15x Standing RLE stepping and weight shifting onto 4 in step 15x Therapeutic Activity: Seated heel slides over towel 15x Seated heel slides YTB 15x Seated FAQs (YTB to maintain abduction) 15x Seated abduction YTB 15x FAQs with adduction 15x  [facility] Adult PT Treatment:                                                DATE: 01/01/24 Therapeutic Exercise: Nustep level 2 x 8 mins Seated LAQ RLE 2x10  Seated Heel slides 2x10 Seated hamstring curls 2x10 RLE RTB Seated hip adduction ball squeeze 5 hold 2x10 Therapeutic Activity: Standing knee flexion RLE 2x10 STS x5   [facility] Adult PT Treatment:                                                DATE: 12/30/23 Therapeutic Exercise: Nustep level 2 x 8 mins Seated LAQ 2x10  Seated Heel slides 2x10 Seated hamstring curls 2x20 RLE RTB Therapeutic Activity: Standing knee flexion 2x10 STS 2x5   [facility] Adult PT Treatment:                                                DATE: 12/24/23 Therapeutic Exercise: Nustep level 2 x 8 mins Seated LAQ 2x10  Seated Heel slides 2x10 Seated hamstring stretch 30 BIL Seated hamstring curls 2x20 RLE Therapeutic Activity: Standing knee flexion 2x5 STS x5     PATIENT EDUCATION:  Education details: Discussed eval findings, rehab rationale and POC and patient is in agreement  Person educated: Patient Education method: Explanation and Handouts Education comprehension: verbalized understanding and needs further education  HOME EXERCISE PROGRAM: Access Code: [email or web address] URL:  [email or web address] Date: 12/20/2023 Prepared by: Reyes Kohut  Exercises - Seated Heel Slide  - 5 x daily - 5 x weekly - 1 sets - 10 reps - Seated Long Arc Quad  - 5 x daily - 5 x weekly - 1 sets - 10 reps - Heel Toe Raises with Counter Support  - 5 x daily - 5 x weekly - 1 sets - 10 reps - Standing Heel Raise with Support  - 5 x daily - 5 x weekly - 1 sets - 10 reps - Standing Knee Flexion AROM with Chair Support  - 5 x daily - 5 x weekly - 1 sets - 10 reps  ASSESSMENT:  CLINICAL IMPRESSION:  Continued high pain levels.  Focus of today was ROM and global strengthening of RLE.  Patient unable to lie supine for additional tasks.    EVAL: Patient is a 63 y.o. female who was seen today for physical therapy evaluation and treatment for R knee pain and stiffness following arthroscopic debridement.  Patient has a walker but her sister does not allow her to use it.  Due to pain and fall risk scope of assessment limited.  Portal sites inspected w/o issues noted.  Patient presents with full knee extension and 90d flexion.  Marked B valgus present.    OBJECTIVE IMPAIRMENTS: Abnormal gait,  decreased activity tolerance, decreased balance, decreased endurance, decreased knowledge of condition, decreased knowledge of use of DME, decreased mobility, difficulty walking, decreased ROM, decreased strength, decreased safety awareness, postural dysfunction, obesity, and pain.   ACTIVITY LIMITATIONS: carrying, lifting, sitting, standing, squatting, stairs, transfers, and bed mobility  PERSONAL FACTORS: Age, Fitness, and Past/current experiences are also affecting patient's functional outcome.   REHAB POTENTIAL: Good  CLINICAL DECISION MAKING: Stable/uncomplicated  EVALUATION COMPLEXITY: Moderate   GOALS: Goals reviewed with patient? No  SHORT TERM GOALS: Target date: 01/10/2024   Patient to demonstrate independence in HEP  Baseline:  [id number] Goal status: MET Pt reports adherence  01/01/24  2.  Increase AROM R knee flexion to 120d Baseline: 90d Goal status: INITIAL  3.  Patient to use walker for mobility needs until cleared by PT Baseline: No AD used Goal status: MET 12/31/33: Pt utilizing walker   LONG TERM GOALS: Target date: 02/14/2024    Patient will acknowledge 4/10 pain at least once during episode of care   Baseline: 8/10 Goal status: INITIAL  2.  Patient will score at least 15/50 on LEFS to signify clinically meaningful improvement in functional abilities.   Baseline: 25/50 Goal status: INITIAL  3.  Patient will increase 30s chair stand reps from 0 to 1 without arms to demonstrate and improved functional ability with less pain/difficulty as well as reduce fall risk.  Baseline:  Goal status: INITIAL  4.  Increase R knee strength to 4/5 Baseline: 4-/5 Goal status: INITIAL    PLAN:  PT FREQUENCY: 1-2x/week  PT DURATION: 6 weeks  PLANNED INTERVENTIONS: 97110-Therapeutic exercises, 97530- Therapeutic activity, 97112- Neuromuscular re-education, 97535- Self Care, [id number]- Manual therapy, [phone]- Gait training, Patient/Family education, Balance training, and Stair training  PLAN FOR NEXT SESSION: HEP review and update, manual techniques as appropriate, aerobic tasks, ROM and flexibility activities, strengthening and PREs, TPDN, gait and balance training,aquatic therapy, modalities for pain and NMRE    Date of referral: 12/13/2023 Referring provider: Jule Ronal CROME, PA-C Referring diagnosis? Z98.890 (ICD-10-CM) - S/P right knee arthroscopy Treatment diagnosis? (if different than referring diagnosis) R knee pain  What was this (referring dx) caused by? Surgery (Type: arthroscopy)  Nature of Condition: Initial Onset (within last 3 months)   Laterality: Rt  Current Functional Measure Score: LEFS 25/50  Objective measurements identify impairments when they are compared to normal values, the uninvolved extremity, and prior level of  function.  [x]  Yes  []  No  Objective assessment of functional ability: Moderate functional limitations   Briefly describe symptoms: R knee pain, stiffness and weakness  How did symptoms start: gradual degeneration  Average pain intensity:  Last 24 hours: 8  Past week: 10  How often does the pt experience symptoms? Constantly  How much have the symptoms interfered with usual daily activities? Moderately  How has condition changed since care began at this facility? NA - initial visit  In general, how is the patients overall health? Good   BACK PAIN (STarT Back Screening Tool) No   Reyes CHRISTELLA Kohut, PT 01/06/2024, 10:11 AM

## 2024-01-06 ENCOUNTER — Ambulatory Visit

## 2024-01-06 DIAGNOSIS — M25561 Pain in right knee: Secondary | ICD-10-CM | POA: Diagnosis not present

## 2024-01-06 DIAGNOSIS — R2689 Other abnormalities of gait and mobility: Secondary | ICD-10-CM

## 2024-01-06 DIAGNOSIS — M6281 Muscle weakness (generalized): Secondary | ICD-10-CM

## 2024-01-08 ENCOUNTER — Ambulatory Visit

## 2024-01-08 DIAGNOSIS — M25561 Pain in right knee: Secondary | ICD-10-CM | POA: Diagnosis not present

## 2024-01-08 DIAGNOSIS — G8929 Other chronic pain: Secondary | ICD-10-CM

## 2024-01-08 DIAGNOSIS — M6281 Muscle weakness (generalized): Secondary | ICD-10-CM

## 2024-01-08 DIAGNOSIS — R2689 Other abnormalities of gait and mobility: Secondary | ICD-10-CM

## 2024-01-08 NOTE — Therapy (Signed)
 OUTPATIENT PHYSICAL THERAPY TREATMENT NOTE   Patient Name: Lauren Moore MRN: [id number] DOB:06-Apr-1960, 63 y.o., female Today's Date: 01/08/2024  END OF SESSION:  PT End of Session - 01/08/24 0913     Visit Number 6    Number of Visits 12    Date for Recertification  02/19/23    Authorization Type [employer]    Authorization Time Period no visit limit    PT Start Time 0915    PT Stop Time 0955    PT Time Calculation (min) 40 min    Activity Tolerance Patient tolerated treatment well;Patient limited by fatigue;Patient limited by pain    Behavior During Therapy [facility] for tasks assessed/performed          Past Medical History:  Diagnosis Date   Arthritis    Brain injury (HCC)    Dyspnea    Obesity    Sleep apnea    cpap   Past Surgical History:  Procedure Laterality Date   BRAIN SURGERY     COLONOSCOPY N/A 06/26/2012   Procedure: COLONOSCOPY;  Surgeon: Norleen JAYSON Hint, MD;  Location: [facility];  Service: Endoscopy;  Laterality: N/A;   KNEE ARTHROSCOPY WITH SUBCHONDROPLASTY Right 12/06/2023   Procedure: ARTHROSCOPY, KNEE, WITH SUBCHONDROPLASTY;  Surgeon: Jerri Kay HERO, MD;  Location: [facility];  Service: Orthopedics;  Laterality: Right;  right knee scope chondroplasty, subchondroplasty of lateral femoral condyle, lateral tibia plateau   REVERSE SHOULDER ARTHROPLASTY Left 07/25/2022   Procedure: LEFT REVERSE SHOULDER ARTHROPLASTY;  Surgeon: Cristy Bonner DASEN, MD;  Location: [facility];  Service: Orthopedics;  Laterality: Left;   TYMPANOPLASTY Right 11/25/2017   Procedure: TYMPANOPLASTY;  Surgeon: Jesus Oliphant, MD;  Location: [facility];  Service: ENT;  Laterality: Right;   Patient Active Problem List   Diagnosis Date Noted   Complex tear of lateral meniscus of right knee, initial encounter 12/06/2023   Chondromalacia, right knee 12/06/2023   Closed subchondral insufficiency fracture of condyle of right femur (HCC) 11/14/2023   Insufficiency  fracture of tibia 11/14/2023   Body mass index 45.0-49.9, adult (HCC) 11/14/2023   Primary osteoarthritis of right knee 10/18/2023   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   OSA (obstructive sleep apnea) 01/02/2022   Screening for human immunodeficiency virus 10/30/2017   Pain of both hip joints 10/30/2017   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   Polyuria 10/30/2017   Weight gain 10/30/2017   Essential hypertension 10/30/2017   Other abnormal glucose 10/30/2017   Breast cancer screening by mammogram 10/30/2017   Conductive hearing loss of right ear 09/26/2017   Perforation of right tympanic membrane 09/26/2017   Rectal bleed 06/24/2012   Hypokalemia 06/24/2012   Thrombocytopenia 06/24/2012   Alcohol  abuse 06/24/2012   Elevated liver enzymes 06/24/2012    PCP:  Janna Ferrier, DO   REFERRING PROVIDER: Jule Ronal CROME, PA-C  REFERRING DIAG: [phone] (ICD-10-CM) - S/P right knee arthroscopy  THERAPY DIAG:  Acute pain of right knee  Other abnormalities of gait and mobility  Muscle weakness (generalized)  Chronic pain of right knee  Rationale for Evaluation and Treatment: Rehabilitation  ONSET DATE: DOS 12/06/23  NEXT MD APPT: 01/28/24 Ronal Jule PA-C  SUBJECTIVE:   SUBJECTIVE STATEMENT:   Patient states that her knee is tender today.    EVAL: Reports R knee pain following recent arthroscopic surgery.  Arrives to [facility] w/o walker as sister took it away  PERTINENT HISTORY: Patient is a pleasant 63 year old female who comes in  today 1 week status post right knee arthroscopic debridement lateral meniscus and subchondroplasty. She has been doing well. Her pain has improved. She is currently ambulating with a walker which she states she was using prior to surgery. Examination of her right knee reveals well healed surgical portals with nylon sutures in place. No evidence of infection or cellulitis. Calves are soft nontender. She is neurovascular  intact distally. Today, portal sutures were removed and Steri-Strips applied. Home exercise program provided. Patient has expressed interest in outpatient physical therapy so referral has been made. I have sent in a refill of pain meds. She will follow-up in 5 weeks for recheck. Call with concerns or questions. PAIN:  Are you having pain? Yes: NPRS scale: 10/10 Pain location: R knee Pain description: ache, sore, throb Aggravating factors: walking, weather, prolonged positioning Relieving factors: rest  PRECAUTIONS: None  RED FLAGS: None   WEIGHT BEARING RESTRICTIONS: No  FALLS:  Has patient fallen in last 6 months? No  OCCUPATION: not working  PLOF: Independent  PATIENT GOALS: To manage my knee symptoms  NEXT MD VISIT: 01/28/24  OBJECTIVE:  Note: Objective measures were completed at Evaluation unless otherwise noted.  DIAGNOSTIC FINDINGS: none  PATIENT SURVEYS:  LEFS    MUSCLE LENGTH: deferred  POSTURE: marked B knee valgus  PALPATION: Not tested  LOWER EXTREMITY ROM:  Active ROM Right eval Left eval  Hip flexion    Hip extension    Hip abduction    Hip adduction    Hip internal rotation    Hip external rotation    Knee flexion 90dP!   Knee extension 0d   Ankle dorsiflexion    Ankle plantarflexion    Ankle inversion    Ankle eversion     (Blank rows = not tested)  LOWER EXTREMITY MMT:  MMT Right eval Left eval  Hip flexion    Hip extension    Hip abduction    Hip adduction    Hip internal rotation    Hip external rotation    Knee flexion 4-   Knee extension 4-   Ankle dorsiflexion WFL   Ankle plantarflexion WFL   Ankle inversion    Ankle eversion     (Blank rows = not tested)  LOWER EXTREMITY SPECIAL TESTS:  Deferred due to pain  FUNCTIONAL TESTS:  30 seconds chair stand test  GAIT: Distance walked: 33ftx2 Assistive device utilized: None used today but has walker at home Level of assistance: Complete Independence Comments:  slow cadence, antalgic gait with wall surfing noted                                                                                                                               TREATMENT:  [facility] Adult PT Treatment:  DATE: 01/08/24 Therapeutic Exercise: Nustep level 2 x 8 mins Seated LAQ RLE 2x10  Seated Heel slides 2x10 Seated hamstring curls 2x10 RLE RTB Seated hip adduction ball squeeze 5 hold 2x10 Seated hip abduction 2x10 RTB Seated FAQ with adduction 2x10 Therapeutic Activity: STS x3 Standing heel raises 2x10  [facility] Adult PT Treatment:                                                DATE: 01/06/24 Therapeutic Exercise: Nustep L4 8 min Neuromuscular re-ed: Standing heel raises 15x Standing toe raises 15x Standing RLE stepping and weight shifting onto 4 in step 15x Therapeutic Activity: Seated heel slides over towel 15x Seated heel slides YTB 15x Seated FAQs (YTB to maintain abduction) 15x Seated abduction YTB 15x FAQs with adduction 15x  [facility] Adult PT Treatment:                                                DATE: 01/01/24 Therapeutic Exercise: Nustep level 2 x 8 mins Seated LAQ RLE 2x10  Seated Heel slides 2x10 Seated hamstring curls 2x10 RLE RTB Seated hip adduction ball squeeze 5 hold 2x10 Therapeutic Activity: Standing knee flexion RLE 2x10 STS x5   PATIENT EDUCATION:  Education details: Discussed eval findings, rehab rationale and POC and patient is in agreement  Person educated: Patient Education method: Explanation and Handouts Education comprehension: verbalized understanding and needs further education  HOME EXERCISE PROGRAM: Access Code: [email or web address] URL: [email or web address] Date: 12/20/2023 Prepared by: Reyes Kohut  Exercises - Seated Heel Slide  - 5 x daily - 5 x weekly - 1 sets - 10 reps - Seated Long Arc Quad  - 5 x daily - 5 x weekly - 1 sets - 10 reps - Heel Toe Raises with Counter  Support  - 5 x daily - 5 x weekly - 1 sets - 10 reps - Standing Heel Raise with Support  - 5 x daily - 5 x weekly - 1 sets - 10 reps - Standing Knee Flexion AROM with Chair Support  - 5 x daily - 5 x weekly - 1 sets - 10 reps  ASSESSMENT:  CLINICAL IMPRESSION:   Patient presents to PT reporting that her knee is bothering her a lot today. States she has been compliant with her HEP. Focused today's session on ROM and strengthening of the LE. Increase in pain and soreness with knee flexion, patient reports that knee extension feels better. Patient will benefit from skilled PT in order to increase functional mobility.  EVAL: Patient is a 63 y.o. female who was seen today for physical therapy evaluation and treatment for R knee pain and stiffness following arthroscopic debridement.  Patient has a walker but her sister does not allow her to use it.  Due to pain and fall risk scope of assessment limited.  Portal sites inspected w/o issues noted.  Patient presents with full knee extension and 90d flexion.  Marked B valgus present.    OBJECTIVE IMPAIRMENTS: Abnormal gait, decreased activity tolerance, decreased balance, decreased endurance, decreased knowledge of condition, decreased knowledge of use of DME, decreased mobility, difficulty walking, decreased ROM, decreased strength, decreased safety awareness, postural dysfunction, obesity, and pain.   ACTIVITY LIMITATIONS: carrying, lifting,  sitting, standing, squatting, stairs, transfers, and bed mobility  PERSONAL FACTORS: Age, Fitness, and Past/current experiences are also affecting patient's functional outcome.   REHAB POTENTIAL: Good  CLINICAL DECISION MAKING: Stable/uncomplicated  EVALUATION COMPLEXITY: Moderate   GOALS: Goals reviewed with patient? No  SHORT TERM GOALS: Target date: 01/10/2024   Patient to demonstrate independence in HEP  Baseline:  [id number] Goal status: MET Pt reports adherence 01/01/24  2.  Increase AROM R knee  flexion to 120d Baseline: 90d Goal status: INITIAL  3.  Patient to use walker for mobility needs until cleared by PT Baseline: No AD used Goal status: MET 12/31/33: Pt utilizing walker   LONG TERM GOALS: Target date: 02/14/2024    Patient will acknowledge 4/10 pain at least once during episode of care   Baseline: 8/10 Goal status: INITIAL  2.  Patient will score at least 15/50 on LEFS to signify clinically meaningful improvement in functional abilities.   Baseline: 25/50 Goal status: INITIAL  3.  Patient will increase 30s chair stand reps from 0 to 1 without arms to demonstrate and improved functional ability with less pain/difficulty as well as reduce fall risk.  Baseline:  Goal status: INITIAL  4.  Increase R knee strength to 4/5 Baseline: 4-/5 Goal status: INITIAL    PLAN:  PT FREQUENCY: 1-2x/week  PT DURATION: 6 weeks  PLANNED INTERVENTIONS: 97110-Therapeutic exercises, 97530- Therapeutic activity, 97112- Neuromuscular re-education, 97535- Self Care, [id number]- Manual therapy, [phone]- Gait training, Patient/Family education, Balance training, and Stair training  PLAN FOR NEXT SESSION: HEP review and update, manual techniques as appropriate, aerobic tasks, ROM and flexibility activities, strengthening and PREs, TPDN, gait and balance training,aquatic therapy, modalities for pain and NMRE    Date of referral: 12/13/2023 Referring provider: Jule Ronal CROME, PA-C Referring diagnosis? Z98.890 (ICD-10-CM) - S/P right knee arthroscopy Treatment diagnosis? (if different than referring diagnosis) R knee pain  What was this (referring dx) caused by? Surgery (Type: arthroscopy)  Nature of Condition: Initial Onset (within last 3 months)   Laterality: Rt  Current Functional Measure Score: LEFS 25/50  Objective measurements identify impairments when they are compared to normal values, the uninvolved extremity, and prior level of function.  [x]  Yes  []  No  Objective  assessment of functional ability: Moderate functional limitations   Briefly describe symptoms: R knee pain, stiffness and weakness  How did symptoms start: gradual degeneration  Average pain intensity:  Last 24 hours: 8  Past week: 10  How often does the pt experience symptoms? Constantly  How much have the symptoms interfered with usual daily activities? Moderately  How has condition changed since care began at this facility? NA - initial visit  In general, how is the patients overall health? Good   BACK PAIN (STarT Back Screening Tool) No   Shanda Code, SPTA 01/08/2024, 9:13 AM

## 2024-01-13 ENCOUNTER — Ambulatory Visit

## 2024-01-22 NOTE — Therapy (Signed)
 " OUTPATIENT PHYSICAL THERAPY TREATMENT NOTE   Patient Name: Lauren Moore MRN: [id number] DOB:02/16/1960, 63 y.o., female Today's Date: 01/27/2024  END OF SESSION:  PT End of Session - 01/27/24 1307     Visit Number 7    Number of Visits 12    Date for Recertification  02/19/23    Authorization Type [employer]    Authorization Time Period no visit limit    PT Start Time 1315    PT Stop Time 1355    PT Time Calculation (min) 40 min    Activity Tolerance Patient tolerated treatment well;Patient limited by fatigue;Patient limited by pain    Behavior During Therapy WFL for tasks assessed/performed           Past Medical History:  Diagnosis Date   Arthritis    Brain injury (HCC)    Dyspnea    Obesity    Sleep apnea    cpap   Past Surgical History:  Procedure Laterality Date   BRAIN SURGERY     COLONOSCOPY N/A 06/26/2012   Procedure: COLONOSCOPY;  Surgeon: Norleen JAYSON Hint, MD;  Location: [facility];  Service: Endoscopy;  Laterality: N/A;   KNEE ARTHROSCOPY WITH SUBCHONDROPLASTY Right 12/06/2023   Procedure: ARTHROSCOPY, KNEE, WITH SUBCHONDROPLASTY;  Surgeon: Jerri Kay HERO, MD;  Location: [facility];  Service: Orthopedics;  Laterality: Right;  right knee scope chondroplasty, subchondroplasty of lateral femoral condyle, lateral tibia plateau   REVERSE SHOULDER ARTHROPLASTY Left 07/25/2022   Procedure: LEFT REVERSE SHOULDER ARTHROPLASTY;  Surgeon: Cristy Bonner DASEN, MD;  Location: [facility];  Service: Orthopedics;  Laterality: Left;   TYMPANOPLASTY Right 11/25/2017   Procedure: TYMPANOPLASTY;  Surgeon: Jesus Oliphant, MD;  Location: [facility];  Service: ENT;  Laterality: Right;   Patient Active Problem List   Diagnosis Date Noted   Complex tear of lateral meniscus of right knee, initial encounter 12/06/2023   Chondromalacia, right knee 12/06/2023   Closed subchondral insufficiency fracture of condyle of right femur (HCC) 11/14/2023   Insufficiency  fracture of tibia 11/14/2023   Body mass index 45.0-49.9, adult (HCC) 11/14/2023   Primary osteoarthritis of right knee 10/18/2023   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   OSA (obstructive sleep apnea) 01/02/2022   Screening for human immunodeficiency virus 10/30/2017   Pain of both hip joints 10/30/2017   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   Polyuria 10/30/2017   Weight gain 10/30/2017   Essential hypertension 10/30/2017   Other abnormal glucose 10/30/2017   Breast cancer screening by mammogram 10/30/2017   Conductive hearing loss of right ear 09/26/2017   Perforation of right tympanic membrane 09/26/2017   Rectal bleed 06/24/2012   Hypokalemia 06/24/2012   Thrombocytopenia 06/24/2012   Alcohol  abuse 06/24/2012   Elevated liver enzymes 06/24/2012    PCP:  Janna Ferrier, DO   REFERRING PROVIDER: Jule Ronal CROME, PA-C  REFERRING DIAG: [phone] (ICD-10-CM) - S/P right knee arthroscopy  THERAPY DIAG:  Acute pain of right knee  Other abnormalities of gait and mobility  Muscle weakness (generalized)  Rationale for Evaluation and Treatment: Rehabilitation  ONSET DATE: DOS 12/06/23  NEXT MD APPT: 01/28/24 Ronal Jule PA-C  SUBJECTIVE:   SUBJECTIVE STATEMENT:    Continues to report R knee pain and soreness, 8/10 in intensity.     EVAL: Reports R knee pain following recent arthroscopic surgery.  Arrives to [facility]PP[facility] w/o walker as sister took it away  PERTINENT HISTORY: Patient is a pleasant 63 year old female who comes  in today 1 week status post right knee arthroscopic debridement lateral meniscus and subchondroplasty. She has been doing well. Her pain has improved. She is currently ambulating with a walker which she states she was using prior to surgery. Examination of her right knee reveals well healed surgical portals with nylon sutures in place. No evidence of infection or cellulitis. Calves are soft nontender. She is neurovascular intact  distally. Today, portal sutures were removed and Steri-Strips applied. Home exercise program provided. Patient has expressed interest in outpatient physical therapy so referral has been made. I have sent in a refill of pain meds. She will follow-up in 5 weeks for recheck. Call with concerns or questions. PAIN:  Are you having pain? Yes: NPRS scale: 10/10 Pain location: R knee Pain description: ache, sore, throb Aggravating factors: walking, weather, prolonged positioning Relieving factors: rest  PRECAUTIONS: None  RED FLAGS: None   WEIGHT BEARING RESTRICTIONS: No  FALLS:  Has patient fallen in last 6 months? No  OCCUPATION: not working  PLOF: Independent  PATIENT GOALS: To manage my knee symptoms  NEXT MD VISIT: 01/28/24  OBJECTIVE:  Note: Objective measures were completed at Evaluation unless otherwise noted.  DIAGNOSTIC FINDINGS: none  PATIENT SURVEYS:  LEFS 15/80; 01/27/24 45/80   MUSCLE LENGTH: deferred  POSTURE: marked B knee valgus  PALPATION: Not tested  LOWER EXTREMITY ROM:  Active ROM Right eval Left eval 01/27/24  Hip flexion     Hip extension     Hip abduction     Hip adduction     Hip internal rotation     Hip external rotation     Knee flexion 90dP!  95d  Knee extension 0d  0d  Ankle dorsiflexion     Ankle plantarflexion     Ankle inversion     Ankle eversion      (Blank rows = not tested)  LOWER EXTREMITY MMT:  MMT Right eval Left eval  Hip flexion    Hip extension    Hip abduction    Hip adduction    Hip internal rotation    Hip external rotation    Knee flexion 4-   Knee extension 4-   Ankle dorsiflexion WFL   Ankle plantarflexion WFL   Ankle inversion    Ankle eversion     (Blank rows = not tested)  LOWER EXTREMITY SPECIAL TESTS:  Deferred due to pain  FUNCTIONAL TESTS:  30 seconds chair stand test 01/27/24 0 reps  GAIT: Distance walked: 62ftx2 Assistive device utilized: None used today but has walker at  home Level of assistance: Complete Independence Comments: slow cadence, antalgic gait with wall surfing noted                                                                                                                               TREATMENT:  [facility] Adult PT Treatment:  DATE: 01/27/24 Therapeutic Exercise: Nustep L3 8 min  Therapeutic Activity: Re-assessment of goal progress, LEFS, ROM Seated FAQs w/adduction 15x2 Seated heel slides 15x2 Seated hamstring curls RTB 15x2 Standing heel raises 15x Standing toe raises 15x  [facility] Adult PT Treatment:                                                DATE: 01/08/24 Therapeutic Exercise: Nustep level 2 x 8 mins Seated LAQ RLE 2x10  Seated Heel slides 2x10 Seated hamstring curls 2x10 RLE RTB Seated hip adduction ball squeeze 5 hold 2x10 Seated hip abduction 2x10 RTB Seated FAQ with adduction 2x10 Therapeutic Activity: STS x3 Standing heel raises 2x10  [facility] Adult PT Treatment:                                                DATE: 01/06/24 Therapeutic Exercise: Nustep L4 8 min Neuromuscular re-ed: Standing heel raises 15x Standing toe raises 15x Standing RLE stepping and weight shifting onto 4 in step 15x Therapeutic Activity: Seated heel slides over towel 15x Seated heel slides YTB 15x Seated FAQs (YTB to maintain abduction) 15x Seated abduction YTB 15x FAQs with adduction 15x  [facility] Adult PT Treatment:                                                DATE: 01/01/24 Therapeutic Exercise: Nustep level 2 x 8 mins Seated LAQ RLE 2x10  Seated Heel slides 2x10 Seated hamstring curls 2x10 RLE RTB Seated hip adduction ball squeeze 5 hold 2x10 Therapeutic Activity: Standing knee flexion RLE 2x10 STS x5   PATIENT EDUCATION:  Education details: Discussed eval findings, rehab rationale and POC and patient is in agreement  Person educated: Patient Education method: Explanation and  Handouts Education comprehension: verbalized understanding and needs further education  HOME EXERCISE PROGRAM: Access Code: [email or web address] URL: [email or web address] Date: 12/20/2023 Prepared by: Reyes Kohut  Exercises - Seated Heel Slide  - 5 x daily - 5 x weekly - 1 sets - 10 reps - Seated Long Arc Quad  - 5 x daily - 5 x weekly - 1 sets - 10 reps - Heel Toe Raises with Counter Support  - 5 x daily - 5 x weekly - 1 sets - 10 reps - Standing Heel Raise with Support  - 5 x daily - 5 x weekly - 1 sets - 10 reps - Standing Knee Flexion AROM with Chair Support  - 5 x daily - 5 x weekly - 1 sets - 10 reps  ASSESSMENT:  CLINICAL IMPRESSION:  Continues to report high levels of pain and discomfort in R knee, 8/10.  Continues to require RW for mobility needs and has been compliant with HEP.  Goals addressed and progress noted.  Declined to schedule additional visits until MD f/u 12/30  EVAL: Patient is a 63 y.o. female who was seen today for physical therapy evaluation and treatment for R knee pain and stiffness following arthroscopic debridement.  Patient has a walker but her sister does not allow her to use it.  Due to pain and fall  risk scope of assessment limited.  Portal sites inspected w/o issues noted.  Patient presents with full knee extension and 90d flexion.  Marked B valgus present.    OBJECTIVE IMPAIRMENTS: Abnormal gait, decreased activity tolerance, decreased balance, decreased endurance, decreased knowledge of condition, decreased knowledge of use of DME, decreased mobility, difficulty walking, decreased ROM, decreased strength, decreased safety awareness, postural dysfunction, obesity, and pain.   ACTIVITY LIMITATIONS: carrying, lifting, sitting, standing, squatting, stairs, transfers, and bed mobility  PERSONAL FACTORS: Age, Fitness, and Past/current experiences are also affecting patient's functional outcome.   REHAB POTENTIAL: Good  CLINICAL DECISION MAKING:  Stable/uncomplicated  EVALUATION COMPLEXITY: Moderate   GOALS: Goals reviewed with patient? No  SHORT TERM GOALS: Target date: 01/10/2024   Patient to demonstrate independence in HEP  Baseline:  [id number] Goal status: MET Pt reports adherence 01/01/24  2.  Increase AROM R knee flexion to 120d Baseline: 90d; 01/27/24 95d Goal status: Ongoing  3.  Patient to use walker for mobility needs until cleared by PT Baseline: No AD used Goal status: MET 12/31/33: Pt utilizing walker   LONG TERM GOALS: Target date: 02/14/2024    Patient will acknowledge 4/10 pain at least once during episode of care   Baseline: 8/10, 01/27/24 8/10 Goal status: Ongoing  2.  Patient will score at least 25/80 on LEFS to signify clinically meaningful improvement in functional abilities.   Baseline: 15/80; 01/27/24 45/80 Goal status: Met  3.  Patient will increase 30s chair stand reps from 0 to 1 without arms to demonstrate and improved functional ability with less pain/difficulty as well as reduce fall risk.  Baseline: 0; 01/27/24 0 Goal status: Ongoing  4.  Increase R knee strength to 4/5 Baseline: 4-/5; 01/27/24 4-/5 Goal status: Ongoing    PLAN:  PT FREQUENCY: 1-2x/week  PT DURATION: 6 weeks  PLANNED INTERVENTIONS: 97110-Therapeutic exercises, 97530- Therapeutic activity, 97112- Neuromuscular re-education, 97535- Self Care, [id number]- Manual therapy, [phone]- Gait training, Patient/Family education, Balance training, and Stair training  PLAN FOR NEXT SESSION: HEP review and update, manual techniques as appropriate, aerobic tasks, ROM and flexibility activities, strengthening and PREs, TPDN, gait and balance training,aquatic therapy, modalities for pain and NMRE    Date of referral: 12/13/2023 Referring provider: Jule Ronal CROME, PA-C Referring diagnosis? Z98.890 (ICD-10-CM) - S/P right knee arthroscopy Treatment diagnosis? (if different than referring diagnosis) R knee pain  What was this  (referring dx) caused by? Surgery (Type: arthroscopy)  Lysle of Condition: Initial Onset (within last 3 months)   Laterality: Rt  Current Functional Measure Score: LEFS 15/80  Objective measurements identify impairments when they are compared to normal values, the uninvolved extremity, and prior level of function.  [x]  Yes  []  No  Objective assessment of functional ability: Moderate functional limitations   Briefly describe symptoms: R knee pain, stiffness and weakness  How did symptoms start: gradual degeneration  Average pain intensity:  Last 24 hours: 8  Past week: 10  How often does the pt experience symptoms? Constantly  How much have the symptoms interfered with usual daily activities? Moderately  How has condition changed since care began at this facility? NA - initial visit  In general, how is the patients overall health? Good   BACK PAIN (STarT Back Screening Tool) No   Reyes CHRISTELLA Kohut, PT, SPTA 01/27/2024, 1:55 PM   "

## 2024-01-27 ENCOUNTER — Ambulatory Visit

## 2024-01-27 DIAGNOSIS — M25561 Pain in right knee: Secondary | ICD-10-CM

## 2024-01-27 DIAGNOSIS — R2689 Other abnormalities of gait and mobility: Secondary | ICD-10-CM

## 2024-01-27 DIAGNOSIS — M6281 Muscle weakness (generalized): Secondary | ICD-10-CM

## 2024-01-28 ENCOUNTER — Ambulatory Visit (INDEPENDENT_AMBULATORY_CARE_PROVIDER_SITE_OTHER): Admitting: Physician Assistant

## 2024-01-28 DIAGNOSIS — Z9889 Other specified postprocedural states: Secondary | ICD-10-CM

## 2024-01-28 NOTE — Progress Notes (Signed)
 "  Post-Op Visit Note   Patient: Lauren Moore           Date of Birth: 03/10/1960           MRN: [id number] Visit Date: 01/28/2024 PCP: Janna Ferrier, DO   Assessment & Plan:  Chief Complaint:  Chief Complaint  Patient presents with   Right Knee - Routine Post Op    12/06/2023-R KNEE SCOPE   Visit Diagnoses:  1. S/P right knee arthroscopy     Plan: Patient is a pleasant 63 year old female who comes in today 6 weeks status post right knee arthroscopic debridement lateral meniscus and chondroplasty.  She has been doing well.  She does have some occasional pain which is relieved with NSAIDs.  She has been in physical therapy and is ambulating with a walker for which she was using prior to surgery.  Examination right knee reveals range of motion from 0 to 110 degrees.  She is neurovascularly intact distally.  At this point, she will continue to advance with activity as tolerated.  Follow-up as needed.  Follow-Up Instructions: Return if symptoms worsen or fail to improve.   Orders:  No orders of the defined types were placed in this encounter.  No orders of the defined types were placed in this encounter.   Imaging: No new imaging  PMFS History: Patient Active Problem List   Diagnosis Date Noted   Complex tear of lateral meniscus of right knee, initial encounter 12/06/2023   Chondromalacia, right knee 12/06/2023   Closed subchondral insufficiency fracture of condyle of right femur (HCC) 11/14/2023   Insufficiency fracture of tibia 11/14/2023   Body mass index 45.0-49.9, adult (HCC) 11/14/2023   Primary osteoarthritis of right knee 10/18/2023   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   OSA (obstructive sleep apnea) 01/02/2022   Screening for human immunodeficiency virus 10/30/2017   Pain of both hip joints 10/30/2017   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   Polyuria 10/30/2017   Weight gain 10/30/2017   Essential hypertension 10/30/2017    Other abnormal glucose 10/30/2017   Breast cancer screening by mammogram 10/30/2017   Conductive hearing loss of right ear 09/26/2017   Perforation of right tympanic membrane 09/26/2017   Rectal bleed 06/24/2012   Hypokalemia 06/24/2012   Thrombocytopenia 06/24/2012   Alcohol  abuse 06/24/2012   Elevated liver enzymes 06/24/2012   Past Medical History:  Diagnosis Date   Arthritis    Brain injury (HCC)    Dyspnea    Obesity    Sleep apnea    cpap    Family History  Problem Relation Age of Onset   Cancer Sister        Lung   Hypertension Sister    Diabetes Sister    Cancer Brother        Lung   Stroke Brother    Hypertension Brother    Colon cancer Neg Hx    Colon polyps Neg Hx    Rectal cancer Neg Hx    Stomach cancer Neg Hx     Past Surgical History:  Procedure Laterality Date   BRAIN SURGERY     COLONOSCOPY N/A 06/26/2012   Procedure: COLONOSCOPY;  Surgeon: Norleen JAYSON Hint, MD;  Location: [facility];  Service: Endoscopy;  Laterality: N/A;   KNEE ARTHROSCOPY WITH SUBCHONDROPLASTY Right 12/06/2023   Procedure: ARTHROSCOPY, KNEE, WITH SUBCHONDROPLASTY;  Surgeon: Jerri Kay HERO, MD;  Location: [facility];  Service: Orthopedics;  Laterality: Right;  right knee scope chondroplasty,  subchondroplasty of lateral femoral condyle, lateral tibia plateau   REVERSE SHOULDER ARTHROPLASTY Left 07/25/2022   Procedure: LEFT REVERSE SHOULDER ARTHROPLASTY;  Surgeon: Cristy Bonner DASEN, MD;  Location: [facility];  Service: Orthopedics;  Laterality: Left;   TYMPANOPLASTY Right 11/25/2017   Procedure: TYMPANOPLASTY;  Surgeon: Jesus Oliphant, MD;  Location: [facility];  Service: ENT;  Laterality: Right;   Social History   Occupational History   Not on file  Tobacco Use   Smoking status: Never   Smokeless tobacco: Never  Vaping Use   Vaping status: Never Used  Substance and Sexual Activity   Alcohol  use: Never   Drug use: Never    Comment: states not smoked in one year   Sexual activity:  Not Currently     "

## 2024-01-31 ENCOUNTER — Encounter: Payer: Self-pay | Admitting: Podiatry

## 2024-01-31 ENCOUNTER — Ambulatory Visit: Admitting: Podiatry

## 2024-01-31 DIAGNOSIS — M79675 Pain in left toe(s): Secondary | ICD-10-CM | POA: Diagnosis not present

## 2024-01-31 DIAGNOSIS — M6240 Contracture of muscle, unspecified site: Secondary | ICD-10-CM

## 2024-01-31 DIAGNOSIS — B351 Tinea unguium: Secondary | ICD-10-CM

## 2024-01-31 DIAGNOSIS — M79674 Pain in right toe(s): Secondary | ICD-10-CM | POA: Diagnosis not present

## 2024-01-31 DIAGNOSIS — L601 Onycholysis: Secondary | ICD-10-CM

## 2024-01-31 NOTE — Patient Instructions (Signed)
 More silicone pads can be purchased from:  [email or web address]

## 2024-01-31 NOTE — Progress Notes (Signed)
"  °  Subjective:  Patient ID: Lauren Moore, female    DOB: 12/02/1960,  MRN: [id number]  Chief Complaint  Patient presents with   Nail Problem    Thick painful toenails, 3 month follow up     64 y.o. female presents with the above complaint. History confirmed with patient. Patient presenting with pain related to dystrophic thickened elongated nails. Patient is unable to trim own nails related to nail dystrophy and mobility issues. Patient does not have a history of T2DM.  Right first toenail specially painful.  She complains of right first toe pain associated with this due to the toe sitting in a dorsiflexed position making it difficult to tolerate shoes.  Objective:  Physical Exam: warm, good capillary refill nail exam onychomycosis of the toenails and dystrophic nails DP pulses palpable, PT pulses palpable, and protective sensation intact Left Foot:  Pain with palpation of nails due to elongation and dystrophic growth.  Right Foot: Pain with palpation of nails due to elongation and dystrophic growth.  Right first toenail loosely adhered able to be trimmed off in its entirety without incident.  Nailbed is tender on palpation without signs of infection.  Right hallux does rest in slight dorsiflexion as well with distal tuft prominent.  Assessment:   1. Pain due to onychomycosis of toenails of both feet   2. Extensor tendon contracture   3. Nail plate separation      Plan:  Patient was evaluated and treated and all questions answered.  #Onychomycosis with pain  -Nails palliatively debrided as below. -Educated on self-care  Procedure: Nail Debridement Rationale: Pain Type of Debridement: manual, sharp debridement. Instrumentation: Nail nipper, rotary burr. Number of Nails: 10   # Right first toe deformity with extensor tendon contracture - Discussed shoe modification and padding of the toe - She is interested in discussing surgery, we will need to have her come back in 3  to 4 weeks to discuss this in great detail. - She does ambulate with the assistance of a walker, unsure how she would tolerate cam boot or surgical shoe. -Will see if pain is better with nail plate being fully debrided due to nail plate separation as well.    Return in about 3 weeks (around 02/21/2024) for Bow strung hallux.         Ethan Saddler, DPM [facility] / [facility]                       "

## 2024-02-13 ENCOUNTER — Ambulatory Visit (INDEPENDENT_AMBULATORY_CARE_PROVIDER_SITE_OTHER)

## 2024-02-13 VITALS — Ht 64.0 in | Wt 272.2 lb

## 2024-02-13 DIAGNOSIS — Z0000 Encounter for general adult medical examination without abnormal findings: Secondary | ICD-10-CM | POA: Diagnosis not present

## 2024-02-13 NOTE — Patient Instructions (Signed)
 [name],  Thank you for taking the time for your Medicare Wellness Visit. I appreciate your continued commitment to your health goals. Please review the care plan we discussed, and feel free to reach out if I can assist you further.  Please note that Annual Wellness Visits do not include a physical exam. Some assessments may be limited, especially if the visit was conducted virtually. If needed, we may recommend an in-person follow-up with your provider.  Ongoing Care Seeing your primary care provider every 3 to 6 months helps us  monitor your health and provide consistent, personalized care.   Referrals If a referral was made during today's visit and you haven't received any updates within two weeks, please contact the referred provider directly to check on the status.  Recommended Screenings:  Health Maintenance  Topic Date Due   DTaP/Tdap/Td vaccine (1 - Tdap) Never done   Pneumococcal Vaccine for age over 79 (1 of 1 - PCV) Never done   Zoster (Shingles) Vaccine (1 of 2) Never done   Medicare Annual Wellness Visit  11/16/2022   Flu Shot  08/30/2023   COVID-19 Vaccine (3 - 2025-26 season) 09/30/2023   Breast Cancer Screening  03/26/2025   Pap with HPV screening  11/24/2028   Colon Cancer Screening  02/18/2030   HPV Vaccine (No Doses Required) Completed   Hepatitis C Screening  Completed   HIV Screening  Completed   Hepatitis B Vaccine  Aged Out   Meningitis B Vaccine  Aged Out       02/13/2024    8:43 AM  Advanced Directives  Does Patient Have a Medical Advance Directive? Yes  Type of Estate Agent of [place];Living will  Does patient want to make changes to medical advance directive? No - Patient declined  Copy of Healthcare Power of Attorney in Chart? Yes - validated most recent copy scanned in chart (See row information)    Vision: Annual vision screenings are recommended for early detection of glaucoma, cataracts, and diabetic retinopathy. These  exams can also reveal signs of chronic conditions such as diabetes and high blood pressure.  Dental: Annual dental screenings help detect early signs of oral cancer, gum disease, and other conditions linked to overall health, including heart disease and diabetes.  Please see the attached documents for additional preventive care recommendations.

## 2024-02-13 NOTE — Progress Notes (Signed)
 "  Chief Complaint  Patient presents with   Medicare Wellness    Subsequent     Subjective:   Lauren Moore is a 64 y.o. female who presents for a Medicare Annual Wellness Visit.  Visit info / Clinical Intake: Medicare Wellness Visit Type:: Subsequent Annual Wellness Visit Persons participating in visit and providing information:: patient & caregiver Medicare Wellness Visit Mode:: In-person (required for WTM) Interpreter Needed?: No Pre-visit prep was completed: yes AWV questionnaire completed by patient prior to visit?: no Living arrangements:: with family/others Patient's Overall Health Status Rating: good Typical amount of pain: none Does pain affect daily life?: no Are you currently prescribed opioids?: no  Dietary Habits and Nutritional Risks How many meals a day?: 3 Eats fruit and vegetables daily?: yes Most meals are obtained by: preparing own meals In the last 2 weeks, have you had any of the following?: none Diabetic:: no  Functional Status Activities of Daily Living (to include ambulation/medication): Independent Ambulation: Independent with device- listed below Home Assistive Devices/Equipment: CPAP; Walker (specify Type); Other (Comment) (hearing aids) Medication Administration: Independent Home Management (perform basic housework or laundry): Independent Manage your own finances?: (!) no Primary transportation is: family / friends Concerns about vision?: no *vision screening is required for WTM* Concerns about hearing?: (!) yes Uses hearing aids?: (!) yes Hear whispered voice?: yes  Fall Screening Falls in the past year?: 1 Number of falls in past year: 1 Was there an injury with Fall?: 0 Fall Risk Category Calculator: 2 Patient Fall Risk Level: Moderate Fall Risk  Fall Risk Patient at Risk for Falls Due to: History of fall(s); Impaired balance/gait Fall risk Follow up: Falls evaluation completed  Home and Transportation Safety: All rugs have  non-skid backing?: N/A, no rugs All stairs or steps have railings?: N/A, no stairs Grab bars in the bathtub or shower?: yes Have non-skid surface in bathtub or shower?: yes Good home lighting?: yes Regular seat belt use?: yes Hospital stays in the last year:: no  Cognitive Assessment Difficulty concentrating, remembering, or making decisions? : yes Will 6CIT or Mini Cog be Completed: no 6CIT or Mini Cog Declined: patient alert, oriented, able to answer questions appropriately and recall recent events  Advance Directives (For Healthcare) Does Patient Have a Medical Advance Directive?: Yes Does patient want to make changes to medical advance directive?: No - Patient declined Type of Advance Directive: Healthcare Power of [place]; Living will Copy of Healthcare Power of Attorney in Chart?: Yes - validated most recent copy scanned in chart (See row information) Copy of Living Will in Chart?: Yes - validated most recent copy scanned in chart (See row information)  Reviewed/Updated  Reviewed/Updated: Reviewed All (Medical, Surgical, Family, Medications, Allergies, Care Teams, Patient Goals)    Allergies (verified) Patient has no known allergies.   Current Medications (verified) Outpatient Encounter Medications as of 02/13/2024  Medication Sig   albuterol  (VENTOLIN  HFA) 108 (90 Base) MCG/ACT inhaler Inhale 1-2 puffs into the lungs every 6 (six) hours as needed for wheezing or shortness of breath.   docusate sodium  (COLACE) 100 MG capsule Take 1 capsule (100 mg total) by mouth daily as needed. (Patient not taking: Reported on 02/13/2024)   doxycycline  (VIBRAMYCIN ) 100 MG capsule Take 1 capsule (100 mg total) by mouth 2 (two) times daily. To be taken after surgery (Patient not taking: Reported on 02/13/2024)   HYDROcodone -acetaminophen  (NORCO/VICODIN) 5-325 MG tablet Take 1 tablet by mouth 2 (two) times daily as needed. (Patient not taking: Reported on 02/13/2024)  meloxicam  (MOBIC ) 15 MG  tablet Take 1 tablet (15 mg total) by mouth daily. (Patient not taking: Reported on 02/13/2024)   methocarbamol  (ROBAXIN ) 750 MG tablet Take 1 tablet (750 mg total) by mouth 3 (three) times daily as needed. (Patient not taking: Reported on 02/13/2024)   ondansetron  (ZOFRAN ) 4 MG tablet Take 1 tablet (4 mg total) by mouth every 8 (eight) hours as needed for nausea or vomiting. (Patient not taking: Reported on 02/13/2024)   oxyCODONE -acetaminophen  (PERCOCET) 5-325 MG tablet Take 1-2 tablets by mouth every 6 (six) hours as needed. To be taken after surgery (Patient not taking: Reported on 02/13/2024)   No facility-administered encounter medications on file as of 02/13/2024.    History: Past Medical History:  Diagnosis Date   Arthritis    Brain injury (HCC)    Dyspnea    Obesity    Sleep apnea    cpap   Past Surgical History:  Procedure Laterality Date   BRAIN SURGERY     COLONOSCOPY N/A 06/26/2012   Procedure: COLONOSCOPY;  Surgeon: Norleen JAYSON Hint, MD;  Location: [facility];  Service: Endoscopy;  Laterality: N/A;   KNEE ARTHROSCOPY WITH SUBCHONDROPLASTY Right 12/06/2023   Procedure: ARTHROSCOPY, KNEE, WITH SUBCHONDROPLASTY;  Surgeon: Jerri Kay HERO, MD;  Location: [facility];  Service: Orthopedics;  Laterality: Right;  right knee scope chondroplasty, subchondroplasty of lateral femoral condyle, lateral tibia plateau   REVERSE SHOULDER ARTHROPLASTY Left 07/25/2022   Procedure: LEFT REVERSE SHOULDER ARTHROPLASTY;  Surgeon: Cristy Bonner DASEN, MD;  Location: [facility];  Service: Orthopedics;  Laterality: Left;   TYMPANOPLASTY Right 11/25/2017   Procedure: TYMPANOPLASTY;  Surgeon: Jesus Oliphant, MD;  Location: [facility];  Service: ENT;  Laterality: Right;   Family History  Problem Relation Age of Onset   Cancer Sister        Lung   Hypertension Sister    Diabetes Sister    Cancer Brother        Lung   Stroke Brother    Hypertension Brother    Colon cancer Neg Hx    Colon polyps Neg Hx     Rectal cancer Neg Hx    Stomach cancer Neg Hx    Social History   Occupational History   Not on file  Tobacco Use   Smoking status: Never   Smokeless tobacco: Never  Vaping Use   Vaping status: Never Used  Substance and Sexual Activity   Alcohol  use: Never   Drug use: Never    Comment: states not smoked in one year   Sexual activity: Not Currently   Tobacco Counseling Counseling given: Not Answered  SDOH Screenings   Food Insecurity: No Food Insecurity (02/13/2024)  Housing: Low Risk (02/13/2024)  Transportation Needs: No Transportation Needs (02/13/2024)  Utilities: Not At Risk (02/13/2024)  Alcohol  Screen: Low Risk (02/13/2024)  Depression (PHQ2-9): Low Risk (02/13/2024)  Financial Resource Strain: Low Risk (02/13/2024)  Physical Activity: Insufficiently Active (02/13/2024)  Social Connections: Moderately Integrated (02/13/2024)  Stress: No Stress Concern Present (02/13/2024)  Tobacco Use: Low Risk (02/13/2024)  Health Literacy: Inadequate Health Literacy (02/13/2024)   See flowsheets for full screening details  Depression Screen PHQ 2 & 9 Depression Scale- Over the past 2 weeks, how often have you been bothered by any of the following problems? Little interest or pleasure in doing things: 0 Feeling down, depressed, or hopeless (PHQ Adolescent also includes...irritable): 0 PHQ-2 Total Score: 0 Trouble falling or staying asleep, or sleeping too much: 1 (Gets up  at least 4 times to void) Feeling tired or having little energy: 0 Poor appetite or overeating (PHQ Adolescent also includes...weight loss): 0 Feeling bad about yourself - or that you are a failure or have let yourself or your family down: 0 Trouble concentrating on things, such as reading the newspaper or watching television (PHQ Adolescent also includes...like school work): 1 Moving or speaking so slowly that other people could have noticed. Or the opposite - being so fidgety or restless that you have been moving around  a lot more than usual: 0 Thoughts that you would be better off dead, or of hurting yourself in some way: 0 PHQ-9 Total Score: 2 If you checked off any problems, how difficult have these problems made it for you to do your work, take care of things at home, or get along with other people?: Not difficult at all  Depression Treatment Depression Interventions/Treatment : EYV7-0 Score <4 Follow-up Not Indicated     Goals Addressed             This Visit's Progress    02/13/2024: Lose weight.               Objective:    Today's Vitals   02/13/24 0840  Weight: 272 lb 3.2 oz (123.5 kg)  Height: 5' 4 (1.626 m)  PainSc: 0-No pain   Body mass index is 46.72 kg/m.  Hearing/Vision screen Hearing Screening - Comments:: Patient wears hearing aids. Vision Screening - Comments:: Wears eyeglasses.  Dr. My Phebe Daniels Immunizations and Health Maintenance Health Maintenance  Topic Date Due   Medicare Annual Wellness (AWV)  02/12/2025   Mammogram  03/26/2025   Cervical Cancer Screening (HPV/Pap Cotest)  11/24/2028   Colonoscopy  02/18/2030   DTaP/Tdap/Td (2 - Td or Tdap) 05/02/2033   Pneumococcal Vaccine: 50+ Years  Completed   Influenza Vaccine  Completed   Hepatitis B Vaccines 19-59 Average Risk  Completed   HPV VACCINES (No Doses Required) Completed   COVID-19 Vaccine  Completed   Hepatitis C Screening  Completed   HIV Screening  Completed   Zoster Vaccines- Shingrix  Completed   Meningococcal B Vaccine  Aged Out        Assessment/Plan:  This is a routine wellness examination for Lauren Moore.  Patient Care Team: Janna Ferrier, DO as PCP - General (Family Medicine) Lamount Ethan CROME, DPM as Consulting Physician (Podiatry) Daniels, My [place], [place] as Referring Physician (Optometry) [place] GAILS, DO as Consulting Physician (Gastroenterology)  I have personally reviewed and noted the following in the patients chart:   Medical and social history Use of alcohol , tobacco or illicit  drugs  Current medications and supplements including opioid prescriptions. Functional ability and status Nutritional status Physical activity Advanced directives List of other physicians Hospitalizations, surgeries, and ER visits in previous 12 months Vitals Screenings to include cognitive, depression, and falls Referrals and appointments  No orders of the defined types were placed in this encounter.  In addition, I have reviewed and discussed with patient certain preventive protocols, quality metrics, and best practice recommendations. A written personalized care plan for preventive services as well as general preventive health recommendations were provided to patient.   Roz LOISE Fuller, LPN   8/84/7973   Return in about 1 year (around 02/12/2025) for Medicare wellness.  After Visit Summary: (In Person-Declined) Patient declined AVS at this time.  Nurse Notes: None at this time. "

## 2024-02-17 ENCOUNTER — Encounter: Payer: Self-pay | Admitting: Podiatry

## 2024-02-17 ENCOUNTER — Ambulatory Visit: Admitting: Podiatry

## 2024-02-17 DIAGNOSIS — M24574 Contracture, right foot: Secondary | ICD-10-CM | POA: Diagnosis not present

## 2024-02-17 DIAGNOSIS — M205X1 Other deformities of toe(s) (acquired), right foot: Secondary | ICD-10-CM

## 2024-02-17 NOTE — Progress Notes (Signed)
"  °  Subjective:  Patient ID: Lauren Moore, female    DOB: 1960-10-14,  MRN: [id number]  Chief Complaint  Patient presents with   Extensor tendon contracture    Pt stated that she is here to talk about surgery on her right big toe     Discussed the use of AI scribe software for clinical note transcription with the patient, who gave verbal consent to proceed.  History of Present Illness Lauren Moore is a 64 year old female with right great toe extensor tendon contracture who presents for evaluation of persistent right great toe pain and deformity.  She has ongoing pain and tenderness at the right great toe focused at the extensor tendon contracture. Symptoms persist despite conservative care, including nail trimming, padding, shoe modification.  She ambulates with a walker at baseline.  She denies cardiac, pulmonary, or respiratory disease. She uses CPAP at night, is not taking pain medications, and has no medication allergies.      Objective:    Physical Exam CARDIOVASCULAR: DP and PT pulses palpable, cap refill intact to the digits.  Telangiectasias noted.. MUSCULOSKELETAL: Right first MPJ with tight extensor tendon. Right first toe in elevated bow strung position. NEUROLOGICAL: Light touch epicritic sensation intact. SKIN: Telangiectasis present.  No open wounds noted.  Pedal skin well-hydrated.   No images are attached to the encounter.    Results    Assessment:   1. Contracture of joint of foot, right   2. Acquired hallux extensus of right foot      Plan:  Patient was evaluated and treated and all questions answered.  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 Symptomatic and functionally limiting despite conservative management. Surgical intervention indicated for contracture correction and deformity  improvement. Anticipated improved toe position and pain reduction. Risks include wound healing complications, infection, tendon weakness, impaired toe function, and potential tendon rupture. - Reviewed Z-lengthening of extensor tendon with suture repair as surgical intervention. - Reviewed surgical risks and provided consent form for review. Written consent obtained. All questions answered. - Fitted with walking boot for postoperative immobilization  -Did discuss potential for fall risk associated with the cam boot.  She does use a walker at baseline.  She feels that she will be able to utilize the cam boot some assistance. - Recommended ibuprofen and high-strength acetaminophen  for postoperative pain, with oxycodone  for breakthrough pain if needed.  Will send medications on date of surgery. - Instructed to elevate foot, apply ice behind knee, minimize weight-bearing, and avoid ambulation during recovery. - Initiated surgery scheduling at surgery center. - Planned follow-up visit one week postoperatively to assess incision and healing.      Return for Post Op Check.     "

## 2024-02-17 NOTE — Patient Instructions (Addendum)
 Preparing for Surgery      Thank you for choosing [facility] for your surgical care. Our board-certified and board-qualified physicians bring advanced training and a deep commitment to the highest standards in foot and ankle surgery.   From your first consultation to your final steps in recovery, our team is here to ensure you feel informed, supported, and confident throughout the entire process.   Visit [email or web address] to access our Surgery Patient page, where youll find all of the information listed below, plus additional helpful resources.      The Time of Your Surgery   For hospital procedures, your surgery time will be provided at the time of scheduling. If youre scheduled at [facility], youll receive a call from the surgical center 24 hours before your procedure with your confirmed time.?Please refer to the surgery information provided to you during your surgical consultation to determine where your surgery will take place.  Recommended Devices for Easier Recovery  Devices such as a knee scooter, crutches, walker, or wheelchair may or may not be covered by your insurance. If your surgeon has recommended this after surgery and it is not covered by your insurance you are still responsible for obtaining and using the recommended equipment. If you have questions or concerns about what equipment you will need please contact the office.  To support a smoother recovery, weve gathered a list of helpful, recommended equipment. Visit [email or web address] to see the full list.       Taking Medications?   If you are taking daily heart and blood pressure medications, seizure, reflux, allergy, asthma, anxiety, pain, or diabetes medications, make sure you notify the surgery center/hospital before the day of surgery so they can tell you which medications you should take or avoid the day of surgery.    GLP-1 antagonists taken weekly  should be stopped for 7 days before surgery. GLP-1 antagonists taken daily be stopped on the day of surgery. These include:   Dulaglutide (Trulicity)   Exenatide extended release (Bydureon BCise)   Exenatide (Byetta)   Semaglutide (Ozempic; Birch.brandt)   Tirzepatide (Mounjaro)   Liraglutide (Victoza, Saxenda)   Lixisenatide (Adlyxin)   Phentermine (Adipex-P, Lomaira)   Semaglutide (Rybelsus) is oral and should be held for 3 days.   Metformin - should be held for 2 days prior to surgery.   SGLT2 inhibitors should be stopped for 3 days (no longer because of the risk of euglycemic diabetic ketoacidosis) and include:   Canagliflozin (Invokana)   Ertugliflozin (Steglatro)   Dapagliflozin (Farxiga)   Empagliflozin (Jardiance)   Additional medications to stop:   If you take blood thinners (Warfarin, Eliquis, Xarelto), please consult your doctor about stopping before your procedure.   Aspirin : Consult your doctor about stopping 1 week before your procedure.   Anti-inflammatory medications (such as ibuprofen)        Pre-Operative Instructions   Plan to be at the hospital at least an hour and a half (1.5), or at the surgery center (1) hour before your scheduled time, unless otherwise directed by the surgical center/hospital staff. You must have a responsible adult accompany you, remain during the surgery, and drive you home. Make sure you have directions to the surgical center/hospital to ensure you arrive on time.       [facility] [place]             [address]    [address]  [place], [place] [address]    [place], [place] [address]                 [phone]      [facility]   [facility] [place]         [address]                [address]          [place], [place] [address]                            [place], [place] [address]     [facility]         [address]          [place], [place] [address]      If you are having surgery at [facility] or [facility], you will need a copy of your medical history and physical form from your family physician within one month before the date of surgery. We will give you a form for your primary physician to complete.       We will make every effort to accommodate the date you request for surgery. However, there are times when surgery dates or times need to be moved. We will contact you as soon as possible if a schedule change is required.     No aspirin /ibuprofen for one week before surgery. If you are on aspirin , any non-steroidal anti-inflammatory medications (Mobic , Aleve, Ibuprofen) should not be taken seven (7) days before surgery.       No food or drink after midnight the night before surgery unless directed otherwise by the surgical center/hospital staff. If you are having a surgical procedure in our office, and not in the surgical center or hospital, this does not apply to you.     No alcoholic beverages 24 hours before surgery. No smoking 24 hours before or 24 hours after surgery.      Wear loose pants or shorts. They should be loose enough to fit over bandages, boots, and casts.     Do not wear slip-on shoes. Sneakers are preferred.      If you were given a boot during your surgery consultation appointment, be sure to bring it with you to the surgery center/hospital on the day of your surgery. Also, bring crutches, a knee scooter, or a walker if your physician prescribed it for you before your surgery date.      If you have not been contacted by the surgery center/hospital by the day before your surgery, call the surgery center/hospital to confirm the date and time of your surgery.     Leave time from work may vary depending on the type of surgery you have. Appropriate arrangements should be made before surgery with your employer.     Prescriptions will be electronically submitted  to your pharmacy the evening before your surgery or the day of your surgery. Take the medication as directed. Pain medications will not be refilled on weekends and must be approved by the doctor.      Remove nail polish on the operative foot and avoid getting a pedicure two weeks before surgery.      The night before surgery, wash the foot and leg that is being operated on with water  and the antibacterial soap that was provided at your consultation appointment. The antibacterial soap is encased in the  brush. You will need to wet the brush to cleanse the area. Be sure to pay special attention to beneath the toenails and in between the toes. Wash for at least three (3) minutes. Rinse thoroughly with water  and pat dry with a towel. Perform this wash unless told not to do so by your physician.    If you have any questions regarding the scheduling of your surgery, please contact our surgical department at [phone]   If you have any questions regarding any of these instructions, please do not hesitate to call our triage nurse at [phone]    Look for an EvenUp shoe attachment on [employer] or at [employer]. This will level out your hips while you are walking in the CAM boot. Wear this on the other foot around a supportive sneaker:

## 2024-04-30 ENCOUNTER — Ambulatory Visit: Admitting: Podiatry
